# Patient Record
Sex: Female | Born: 1950 | Race: White | Hispanic: No | Smoking: Never smoker
Health system: Southern US, Community
[De-identification: ages and names within clinical notes are randomized; demographics above are authoritative.]

## PROBLEM LIST (undated history)

## (undated) DIAGNOSIS — K8070 Calculus of gallbladder and bile duct without cholecystitis without obstruction: Secondary | ICD-10-CM

## (undated) DIAGNOSIS — K449 Diaphragmatic hernia without obstruction or gangrene: Secondary | ICD-10-CM

## (undated) DIAGNOSIS — K317 Polyp of stomach and duodenum: Secondary | ICD-10-CM

## (undated) DIAGNOSIS — M1990 Unspecified osteoarthritis, unspecified site: Secondary | ICD-10-CM

## (undated) DIAGNOSIS — K219 Gastro-esophageal reflux disease without esophagitis: Secondary | ICD-10-CM

## (undated) DIAGNOSIS — E785 Hyperlipidemia, unspecified: Secondary | ICD-10-CM

## (undated) DIAGNOSIS — E119 Type 2 diabetes mellitus without complications: Secondary | ICD-10-CM

## (undated) DIAGNOSIS — R519 Headache, unspecified: Secondary | ICD-10-CM

## (undated) DIAGNOSIS — J189 Pneumonia, unspecified organism: Secondary | ICD-10-CM

## (undated) HISTORY — DX: Hyperlipidemia, unspecified: E78.5

## (undated) HISTORY — PX: CYST REMOVAL TRUNK: SHX6283

## (undated) HISTORY — PX: APPENDECTOMY: SHX54

## (undated) HISTORY — DX: Type 2 diabetes mellitus without complications: E11.9

## (undated) HISTORY — PX: ABDOMINAL HYSTERECTOMY: SHX81

---
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
## 2001-02-08 ENCOUNTER — Encounter: Payer: Self-pay | Admitting: Gastroenterology

## 2001-02-08 ENCOUNTER — Ambulatory Visit (HOSPITAL_COMMUNITY): Admission: RE | Admit: 2001-02-08 | Discharge: 2001-02-08 | Payer: Self-pay | Admitting: Gastroenterology

## 2004-07-04 ENCOUNTER — Other Ambulatory Visit: Admission: RE | Admit: 2004-07-04 | Discharge: 2004-07-04 | Payer: Self-pay | Admitting: *Deleted

## 2007-12-03 DIAGNOSIS — E042 Nontoxic multinodular goiter: Secondary | ICD-10-CM | POA: Insufficient documentation

## 2011-04-17 DIAGNOSIS — E1169 Type 2 diabetes mellitus with other specified complication: Secondary | ICD-10-CM | POA: Insufficient documentation

## 2011-04-17 DIAGNOSIS — E785 Hyperlipidemia, unspecified: Secondary | ICD-10-CM | POA: Insufficient documentation

## 2011-04-17 DIAGNOSIS — E041 Nontoxic single thyroid nodule: Secondary | ICD-10-CM | POA: Insufficient documentation

## 2013-09-19 ENCOUNTER — Encounter (HOSPITAL_COMMUNITY): Payer: Self-pay | Admitting: Emergency Medicine

## 2013-09-19 ENCOUNTER — Emergency Department (HOSPITAL_COMMUNITY)
Admission: EM | Admit: 2013-09-19 | Discharge: 2013-09-19 | Disposition: A | Discharge status: Home / Self Care | Payer: Worker's Compensation | Attending: Emergency Medicine | Admitting: Emergency Medicine

## 2013-09-19 DIAGNOSIS — Z7982 Long term (current) use of aspirin: Secondary | ICD-10-CM | POA: Diagnosis not present

## 2013-09-19 DIAGNOSIS — Y9289 Other specified places as the place of occurrence of the external cause: Secondary | ICD-10-CM | POA: Diagnosis not present

## 2013-09-19 DIAGNOSIS — Z79899 Other long term (current) drug therapy: Secondary | ICD-10-CM | POA: Insufficient documentation

## 2013-09-19 DIAGNOSIS — S61409A Unspecified open wound of unspecified hand, initial encounter: Secondary | ICD-10-CM | POA: Diagnosis present

## 2013-09-19 DIAGNOSIS — W540XXA Bitten by dog, initial encounter: Secondary | ICD-10-CM | POA: Insufficient documentation

## 2013-09-19 DIAGNOSIS — Y990 Civilian activity done for income or pay: Secondary | ICD-10-CM | POA: Insufficient documentation

## 2013-09-19 DIAGNOSIS — Y9389 Activity, other specified: Secondary | ICD-10-CM | POA: Insufficient documentation

## 2013-09-19 DIAGNOSIS — S61451A Open bite of right hand, initial encounter: Secondary | ICD-10-CM

## 2013-09-19 MED ORDER — AMOXICILLIN-POT CLAVULANATE 875-125 MG PO TABS
1.0000 | ORAL_TABLET | Freq: Once | ORAL | Status: AC
  Administered 2013-09-19: 1 via ORAL
  Filled 2013-09-19: qty 1

## 2013-09-19 MED ORDER — AMOXICILLIN-POT CLAVULANATE 875-125 MG PO TABS: 1.0000 | ORAL_TABLET | Freq: Two times a day (BID) | ORAL | Status: DC

## 2013-09-19 NOTE — ED Notes (Signed)
PA at bedside to completed sutures

## 2013-09-19 NOTE — ED Provider Notes (Signed)
CSN: [id number]     Arrival date & time 09/19/13  2029 History   First MD Initiated Contact with Patient 09/19/13 2050     Chief Complaint  Patient presents with  . Animal Bite   (Consider location/radiation/quality/duration/timing/severity/associated sxs/prior Treatment) HPI History provided by pt.   Pt was bitten by a customer's dog while at work at [employer] just pta.  Bite located on right palm.  Has not washed. Painful and aggravated by palpation and ROM.  Has not taken anything for pain. No associated paresthesias.  Most recent tetanus 6 years ago.  History reviewed. No pertinent past medical history. Past Surgical History  Procedure Laterality Date  . Abdominal hysterectomy    . Cyst removal trunk     History reviewed. No pertinent family history. History  Substance Use Topics  . Smoking status: Never Smoker   . Smokeless tobacco: Not on file  . Alcohol Use: Not on file   OB History   Grav Para Term Preterm Abortions TAB SAB Ect Mult Living                 Review of Systems  All other systems reviewed and are negative.    Allergies  Review of patient's allergies indicates no known allergies.  Home Medications   Current Outpatient Rx  Name  Route  Sig  Dispense  Refill  . aspirin 81 MG tablet   Oral   Take 81 mg by mouth daily.         . calcium-vitamin D (OSCAL WITH D) 500-200 MG-UNIT per tablet   Oral   Take 1 tablet by mouth.         . omega-3 acid ethyl esters (LOVAZA) 1 G capsule   Oral   Take 1 g by mouth daily.         Marland Kitchen omeprazole (PRILOSEC) 20 MG capsule   Oral   Take 20 mg by mouth daily.         Marland Kitchen amoxicillin-clavulanate (AUGMENTIN) 875-125 MG per tablet   Oral   Take 1 tablet by mouth 2 (two) times daily.   13 tablet   0    BP 142/82  Pulse 98  Temp(Src) 98.1 F (36.7 C) (Oral)  Resp 18  SpO2 98% Physical Exam  Nursing note and vitals reviewed. Constitutional: She is oriented to person, place, and time. She appears  well-developed and well-nourished. No distress.  HENT:  Head: Normocephalic and atraumatic.  Eyes:  Normal appearance  Neck: Normal range of motion.  Pulmonary/Chest: Effort normal.  Musculoskeletal: Normal range of motion.  1.5cm subq lac on right thenar eminence.  Oozing.  Clean.  Underlying tissue edematous and ttp.  Painful passive ROM of thumb but 5/5 flexor tendon strength.  Distal sensation intact and brisk cap refill.   Neurological: She is alert and oriented to person, place, and time.  Psychiatric: She has a normal mood and affect. Her behavior is normal.    ED Course  Procedures (including critical care time) LACERATION REPAIR Performed by: Remer Macho Authorized by: Gertha Calkin E Consent: Verbal consent obtained. Risks and benefits: risks, benefits and alternatives were discussed Consent given by: patient Patient identity confirmed: provided demographic data Prepped and Draped in normal sterile fashion Wound explored  Laceration Location: right thenar eminence  Laceration Length: 1.5cmcm  No Foreign Bodies seen or palpated  Anesthesia: local infiltration  Local anesthetic: lidocaine 2% w/out epinephrine  Anesthetic total: 3 ml  Irrigation method: syringe Amount  of cleaning: standard  Skin closure: prolene 4.0  Number of sutures: 4  Technique: simple interrupted  Patient tolerance: Patient tolerated the procedure well with no immediate complications.   Labs Review Labs Reviewed - No data to display Imaging Review No results found.  EKG Interpretation   None       MDM   1. Animal bite of right hand    63yo healthy F presents w/ animal bite to right hand. Wound cleaned and sutured.  Tetanus up to date.  She received first dose of augmentin.  Registration provided her with information regarding contacting animal control. Return precautions discussed.     Remer Macho, PA-C 09/19/13 2134

## 2013-09-19 NOTE — Discharge Instructions (Signed)
Take antibiotic as prescribed.  Keep wound clean and dry.  Follow up with your primary care doctor or [facility] [phone]; [address]) or return to the ER in 7-10 days for wound recheck and suture removal.  You should be seen sooner if you develop fever, worsening pain or redness/drainage of pus at site of wound.     Animal Bite An animal bite can result in a scratch on the skin, deep open cut, puncture of the skin, crush injury, or tearing away of the skin or a body part. Dogs are responsible for most animal bites. Children are bitten more often than adults. An animal bite can range from very mild to more serious. A small bite from your house pet is no cause for alarm. However, some animal bites can become infected or injure a bone or other tissue. You must seek medical care if:  The skin is broken and bleeding does not slow down or stop after 15 minutes.  The puncture is deep and difficult to clean (such as a cat bite).  Pain, warmth, redness, or pus develops around the wound.  The bite is from a stray animal or rodent. There may be a risk of rabies infection.  The bite is from a snake, raccoon, skunk, fox, coyote, or bat. There may be a risk of rabies infection.  The person bitten has a chronic illness such as diabetes, liver disease, or cancer, or the person takes medicine that lowers the immune system.  There is concern about the location and severity of the bite. It is important to clean and protect an animal bite wound right away to prevent infection. Follow these steps:  Clean the wound with plenty of water and soap.  Apply an antibiotic cream.  Apply gentle pressure over the wound with a clean towel or gauze to slow or stop bleeding.  Elevate the affected area above the heart to help stop any bleeding.  Seek medical care. Getting medical care within 8 hours of the animal bite leads to the best possible outcome. DIAGNOSIS  Your caregiver will most  likely:  Take a detailed history of the animal and the bite injury.  Perform a wound exam.  Take your medical history. Blood tests or X-rays may be performed. Sometimes, infected bite wounds are cultured and sent to a lab to identify the infectious bacteria.  TREATMENT  Medical treatment will depend on the location and type of animal bite as well as the patient's medical history. Treatment may include:  Wound care, such as cleaning and flushing the wound with saline solution, bandaging, and elevating the affected area.  Antibiotics.  Tetanus immunization.  Rabies immunization.  Leaving the wound open to heal. This is often done with animal bites, due to the high risk of infection. However, in certain cases, wound closure with stitches, wound adhesive, skin adhesive strips, or staples may be used. Infected bites that are left untreated may require intravenous (IV) antibiotics and surgical treatment in the hospital. Reed  Follow your caregiver's instructions for wound care.  Take all medicines as directed.  If your caregiver prescribes antibiotics, take them as directed. Finish them even if you start to feel better.  Follow up with your caregiver for further exams or immunizations as directed. You may need a tetanus shot if:  You cannot remember when you had your last tetanus shot.  You have never had a tetanus shot.  The injury broke your skin. If you  get a tetanus shot, your arm may swell, get red, and feel warm to the touch. This is common and not a problem. If you need a tetanus shot and you choose not to have one, there is a rare chance of getting tetanus. Sickness from tetanus can be serious. SEEK MEDICAL CARE IF:  You notice warmth, redness, soreness, swelling, pus discharge, or a bad smell coming from the wound.  You have a red line on the skin coming from the wound.  You have a fever, chills, or a general ill feeling.  You have nausea or  vomiting.  You have continued or worsening pain.  You have trouble moving the injured part.  You have other questions or concerns. MAKE SURE YOU:  Understand these instructions.  Will watch your condition.  Will get help right away if you are not doing well or get worse. Document Released: 04/25/2011 Document Revised: 10/30/2011 Document Reviewed: 04/25/2011 [facility] Patient Information 2014 [place].

## 2013-09-19 NOTE — ED Notes (Addendum)
Pt reports being bit by a dog about 30 minutes ago at work.  Pt with 2 puncture wounds to right hand.  Bleeding controlled.  Pt denies being on any blood thinners.  Shots up to date on animal per pt.

## 2013-09-21 NOTE — ED Provider Notes (Signed)
Medical screening examination/treatment/procedure(s) were performed by non-physician practitioner and as supervising physician I was immediately available for consultation/collaboration.  EKG Interpretation   None        Jasper Riling. Alvino Chapel, MD 09/21/13 [phone]

## 2013-09-26 ENCOUNTER — Encounter (HOSPITAL_COMMUNITY): Payer: Self-pay | Admitting: Emergency Medicine

## 2013-09-26 ENCOUNTER — Emergency Department (HOSPITAL_COMMUNITY)
Admission: EM | Admit: 2013-09-26 | Discharge: 2013-09-26 | Disposition: A | Discharge status: Home / Self Care | Payer: Worker's Compensation | Attending: Emergency Medicine | Admitting: Emergency Medicine

## 2013-09-26 DIAGNOSIS — Z792 Long term (current) use of antibiotics: Secondary | ICD-10-CM | POA: Diagnosis not present

## 2013-09-26 DIAGNOSIS — Z7982 Long term (current) use of aspirin: Secondary | ICD-10-CM | POA: Insufficient documentation

## 2013-09-26 DIAGNOSIS — Z79899 Other long term (current) drug therapy: Secondary | ICD-10-CM | POA: Insufficient documentation

## 2013-09-26 DIAGNOSIS — Z4802 Encounter for removal of sutures: Secondary | ICD-10-CM | POA: Diagnosis present

## 2013-09-26 NOTE — ED Provider Notes (Signed)
Medical screening examination/treatment/procedure(s) were performed by non-physician practitioner and as supervising physician I was immediately available for consultation/collaboration.  EKG Interpretation   None         Delice Bison Koury Roddy, DO 09/26/13 1453

## 2013-09-26 NOTE — ED Notes (Signed)
Here for suture removal right hand from dog bite. Wound appears red around the edges. Pt states is "sore".

## 2013-09-26 NOTE — ED Provider Notes (Signed)
CSN: [id number]     Arrival date & time 09/26/13  0925 History  This chart was scribed for Michele Mcalpine, non-physician practitioner, working with Delice Bison Ward, DO by Jenne Campus, ED Scribe. This patient was seen in room TR06C/TR06C and the patient's care was started at 9:39 AM.    CC: Suture removal  The history is provided by the patient. No language interpreter was used.    HPI Comments: Kendra Miles is a 63 y.o. female who presents to the Emergency Department for suture removal. Pt reports that she was bitten by a small terrier on 09/19/13. She was seen approximately 2 hours after the incident and had the wound sutured. She denies any fevers, discharge from the area or other complications since then.    History reviewed. No pertinent past medical history. Past Surgical History  Procedure Laterality Date  . Abdominal hysterectomy    . Cyst removal trunk     No family history on file. History  Substance Use Topics  . Smoking status: Never Smoker   . Smokeless tobacco: Not on file  . Alcohol Use: No   No OB history provided.  Review of Systems  A complete 10 system review of systems was obtained and all systems are negative except as noted in the HPI and PMH.   Allergies  Review of patient's allergies indicates no known allergies.  Home Medications   Current Outpatient Rx  Name  Route  Sig  Dispense  Refill  . amoxicillin-clavulanate (AUGMENTIN) 875-125 MG per tablet   Oral   Take 1 tablet by mouth 2 (two) times daily.   13 tablet   0   . aspirin 81 MG tablet   Oral   Take 81 mg by mouth daily.         . calcium-vitamin D (OSCAL WITH D) 500-200 MG-UNIT per tablet   Oral   Take 1 tablet by mouth.         . omega-3 acid ethyl esters (LOVAZA) 1 G capsule   Oral   Take 1 g by mouth daily.         Marland Kitchen omeprazole (PRILOSEC) 20 MG capsule   Oral   Take 20 mg by mouth daily.          Triage Vitals: BP 129/73  Pulse 92  Temp(Src) 97.3 F (36.3 C)  (Oral)  SpO2 98%  Physical Exam  Nursing note and vitals reviewed. Constitutional: She is oriented to person, place, and time. She appears well-developed and well-nourished. No distress.  HENT:  Head: Normocephalic and atraumatic.  Eyes: EOM are normal.  Neck: Neck supple. No tracheal deviation present.  Cardiovascular: Normal rate.   Pulmonary/Chest: Effort normal. No respiratory distress.  Musculoskeletal: Normal range of motion.  1 cm well-healed laceration with 4 sutures in place over the right thenar eminence. No discharge or erythema. FROM. Distal pulses intact. Brisk capillary refill  Neurological: She is alert and oriented to person, place, and time.  Skin: Skin is warm and dry.  Psychiatric: She has a normal mood and affect. Her behavior is normal.    ED Course  Procedures (including critical care time)  DIAGNOSTIC STUDIES: Oxygen Saturation is 98% on RA, normal by my interpretation.    COORDINATION OF CARE: SUTURE REMOVAL Performed by: Myrene Buddy, PA-C Consent: Verbal consent obtained. Patient identity confirmed: provided demographic data Time out: Immediately prior to procedure a "time out" was called to verify the correct patient, procedure, equipment, support staff and site/side marked  as required. Location: right hand Wound Appearance: clean Sutures/Staples Removed: 4 Patient tolerance: Patient tolerated the procedure well with no immediate complications.    Labs Review Labs Reviewed - No data to display Imaging Review No results found.  EKG Interpretation   None       MDM   1. Visit for suture removal    Neurovascularly intact. No signs of infection. Return precautions given. Patient states understanding of treatment care plan and is agreeable.   I personally performed the services described in this documentation, which was scribed in my presence. The recorded information has been reviewed and is accurate.   Illene Labrador, PA-C 09/26/13  [phone]

## 2013-09-26 NOTE — Discharge Instructions (Signed)
Suture Removal, Care After Refer to this sheet in the next few weeks. These instructions provide you with information on caring for yourself after your procedure. Your health care provider may also give you more specific instructions. Your treatment has been planned according to current medical practices, but problems sometimes occur. Call your health care provider if you have any problems or questions after your procedure. WHAT TO EXPECT AFTER THE PROCEDURE After your stitches (sutures) are removed, it is typical to have the following:  Some discomfort and swelling in the wound area.  Slight redness in the area. HOME CARE INSTRUCTIONS   If you have skin adhesive strips over the wound area, do not take the strips off. They will fall off on their own in a few days. If the strips remain in place after 14 days, you may remove them.  Change any bandages (dressings) at least once a day or as directed by your health care provider. If the bandage sticks, soak it off with warm, soapy water.  Apply cream or ointment only as directed by your health care provider. If using cream or ointment, wash the area with soap and water 2 times a day to remove all the cream or ointment. Rinse off the soap and pat the area dry with a clean towel.  Keep the wound area dry and clean. If the bandage becomes wet or dirty, or if it develops a bad smell, change it as soon as possible.  Continue to protect the wound from injury.  Use sunscreen when out in the sun. New scars become sunburned easily. SEEK MEDICAL CARE IF:  You have increasing redness, swelling, or pain in the wound.  You see pus coming from the wound.  You have a fever.  You notice a bad smell coming from the wound or dressing.  Your wound breaks open (edges not staying together). Document Released: 05/02/2001 Document Revised: 05/28/2013 Document Reviewed: 03/19/2013 [employer] Patient Information 2014 [employer].  

## 2013-10-18 ENCOUNTER — Encounter (HOSPITAL_COMMUNITY): Payer: Self-pay | Admitting: Emergency Medicine

## 2013-10-18 ENCOUNTER — Emergency Department (HOSPITAL_COMMUNITY)
Admission: EM | Admit: 2013-10-18 | Discharge: 2013-10-18 | Disposition: A | Discharge status: Home / Self Care | Payer: Worker's Compensation | Attending: Emergency Medicine | Admitting: Emergency Medicine

## 2013-10-18 DIAGNOSIS — Y33XXXS Other specified events, undetermined intent, sequela: Secondary | ICD-10-CM | POA: Insufficient documentation

## 2013-10-18 DIAGNOSIS — L089 Local infection of the skin and subcutaneous tissue, unspecified: Secondary | ICD-10-CM

## 2013-10-18 DIAGNOSIS — Z5189 Encounter for other specified aftercare: Secondary | ICD-10-CM

## 2013-10-18 DIAGNOSIS — Z79899 Other long term (current) drug therapy: Secondary | ICD-10-CM | POA: Insufficient documentation

## 2013-10-18 DIAGNOSIS — Z7982 Long term (current) use of aspirin: Secondary | ICD-10-CM | POA: Insufficient documentation

## 2013-10-18 NOTE — ED Notes (Signed)
Reports being seen on 1/31 for dog bite to right hand, reports taking antibiotics and everything was healing fine, now having redness to wound this week.

## 2013-10-18 NOTE — ED Notes (Signed)
Dressing applied to hand with bacitracin

## 2013-10-18 NOTE — Discharge Instructions (Signed)
Read the information below.  You may return to the Emergency Department at any time for worsening condition or any new symptoms that concern you.  If you develop redness, swelling, pus draining from the wound, or fevers greater than 100.4, return to the ER immediately for a recheck.

## 2013-10-18 NOTE — ED Provider Notes (Signed)
CSN: [id number]     Arrival date & time 10/18/13  0811 History  This chart was scribed for non-physician practitioner, Clayton Bibles, PA-C working with Hoy Morn, MD by Frederich Balding, ED scribe. This patient was seen in room TR09C/TR09C and the patient's care was started at 9:44 AM.   Chief Complaint  Patient presents with  . Wound Check   The history is provided by the patient. No language interpreter was used.   HPI Comments: Maribell Demeo is a 63 y.o. female who presents to the Emergency Department for a wound check. Pt was seen 09/19/13 for a dog bite to her right hand. The dog was UTD on its vaccinations. She was given Augmentin and took it as prescribed. Pt came in 09/26/13 for suture removal. She states a little white bump popped up this week and is painful. Pt has put neosporin on it with little relief. Denies fever, weakness or numbness in hand.   History reviewed. No pertinent past medical history. Past Surgical History  Procedure Laterality Date  . Abdominal hysterectomy    . Cyst removal trunk     History reviewed. No pertinent family history. History  Substance Use Topics  . Smoking status: Never Smoker   . Smokeless tobacco: Not on file  . Alcohol Use: No   OB History   Grav Para Term Preterm Abortions TAB SAB Ect Mult Living                 Review of Systems  Constitutional: Negative for fever.  Skin: Positive for wound (healing).  Neurological: Negative for weakness and numbness.  All other systems reviewed and are negative.   Allergies  Review of patient's allergies indicates no known allergies.  Home Medications   Current Outpatient Rx  Name  Route  Sig  Dispense  Refill  . amoxicillin-clavulanate (AUGMENTIN) 875-125 MG per tablet   Oral   Take 1 tablet by mouth 2 (two) times daily.   13 tablet   0   . aspirin 81 MG tablet   Oral   Take 81 mg by mouth daily.         . calcium-vitamin D (OSCAL WITH D) 500-200 MG-UNIT per tablet   Oral   Take 1  tablet by mouth.         . omega-3 acid ethyl esters (LOVAZA) 1 G capsule   Oral   Take 1 g by mouth daily.         Marland Kitchen omeprazole (PRILOSEC) 20 MG capsule   Oral   Take 20 mg by mouth daily.          BP 148/87  Pulse 85  Temp(Src) 97.9 F (36.6 C) (Oral)  Resp 18  Wt 208 lb 6 oz (94.518 kg)  SpO2 96%  Physical Exam  Nursing note and vitals reviewed. Constitutional: She appears well-developed and well-nourished. No distress.  HENT:  Head: Normocephalic and atraumatic.  Neck: Neck supple.  Pulmonary/Chest: Effort normal.  Musculoskeletal:  Right hand, thenar eminence with well healed laceration, mild surrounding erythema and small pustule vs papule at proximal edge.    Neurological: She is alert.  Skin: She is not diaphoretic.    ED Course  Procedures (including critical care time)  DIAGNOSTIC STUDIES: Oxygen Saturation is 96% on RA, normal by my interpretation.    COORDINATION OF CARE: 9:46 AM-Discussed treatment plan which includes speaking with Dr. Venora Maples with pt at bedside and pt agreed to plan.  10:01 AM-Dr. Venora Maples saw  and examined pt. Did bedside US and unroofed small pustule with tiny amount of purulent discharge (please see his note for full details). Recommends referral to hand surgeon and topical antibiotic ointment. Discussed return precautions with pt.   Labs Review Labs Reviewed - No data to display Imaging Review No results found.   EKG Interpretation None      MDM   Final diagnoses:  Visit for wound check  Pustule    Pt with dog bite 09/19/13 that was sutured, sutures removed 09/26/13 with good healing noted.  Pt was doing well until 1 week ago, developed small pustule.  It was unclear from my exam whether this was a pustule or a raised area from an imbedded suture.  I asked Dr Venora Maples to see the patient - please see note above and his note for further details.  Pt d/c home with instructions for wound care and hand surgery follow up.  Discussed  findings, treatment, and follow up  with patient.  Pt given return precautions.  Pt verbalizes understanding and agrees with plan.      I personally performed the services described in this documentation, which was scribed in my presence. The recorded information has been reviewed and is accurate.   [place], PA-C 10/18/13 1107

## 2013-10-19 NOTE — ED Provider Notes (Addendum)
Medical screening examination/treatment/procedure(s) were conducted as a shared visit with non-physician practitioner(s) and myself.  I personally evaluated the patient during the encounter.   EKG Interpretation None         INCISION AND DRAINAGE Performed by: Hoy Morn Consent: Verbal consent obtained. Risks and benefits: risks, benefits and alternatives were discussed Time out performed prior to procedure Type: abscess Body area: right hand Anesthesia: none Incision was made a 21 gauge needle to "un roof" the pustule.. Complexity: simple Drainage: none Drainage amount: none Packing material: none Patient tolerance: Patient tolerated the procedure well with no immediate complications.  Suspect neuroma from healed wound. No signs of infection at this time. Hand surgery follow up   Hoy Morn, MD 10/19/13 [place], MD 10/28/13 [phone]

## 2017-03-06 ENCOUNTER — Other Ambulatory Visit: Payer: Self-pay | Admitting: Physician Assistant

## 2017-03-06 DIAGNOSIS — Z1231 Encounter for screening mammogram for malignant neoplasm of breast: Secondary | ICD-10-CM

## 2017-03-26 ENCOUNTER — Ambulatory Visit
Admission: RE | Admit: 2017-03-26 | Discharge: 2017-03-26 | Disposition: A | Discharge status: Home / Self Care | Payer: Managed Care, Other (non HMO) | Source: Ambulatory Visit | Attending: Physician Assistant | Admitting: Physician Assistant

## 2017-03-26 DIAGNOSIS — Z1231 Encounter for screening mammogram for malignant neoplasm of breast: Secondary | ICD-10-CM

## 2018-02-19 ENCOUNTER — Other Ambulatory Visit: Payer: Self-pay | Admitting: Nurse Practitioner

## 2018-02-19 DIAGNOSIS — Z1231 Encounter for screening mammogram for malignant neoplasm of breast: Secondary | ICD-10-CM

## 2018-04-03 ENCOUNTER — Ambulatory Visit
Admission: RE | Admit: 2018-04-03 | Discharge: 2018-04-03 | Disposition: A | Discharge status: Home / Self Care | Payer: Medicare Other | Source: Ambulatory Visit | Attending: Nurse Practitioner | Admitting: Nurse Practitioner

## 2018-04-03 DIAGNOSIS — Z1231 Encounter for screening mammogram for malignant neoplasm of breast: Secondary | ICD-10-CM

## 2019-02-19 DIAGNOSIS — K8070 Calculus of gallbladder and bile duct without cholecystitis without obstruction: Secondary | ICD-10-CM

## 2019-02-19 HISTORY — DX: Calculus of gallbladder and bile duct without cholecystitis without obstruction: K80.70

## 2019-03-06 ENCOUNTER — Emergency Department (HOSPITAL_COMMUNITY): Payer: Medicare Other

## 2019-03-06 ENCOUNTER — Inpatient Hospital Stay (HOSPITAL_COMMUNITY): Payer: Medicare Other

## 2019-03-06 ENCOUNTER — Inpatient Hospital Stay (HOSPITAL_COMMUNITY)
Admission: EM | Admit: 2019-03-06 | Discharge: 2019-03-10 | DRG: 419 | Disposition: A | Discharge status: Home / Self Care | Payer: Medicare Other | Source: Ambulatory Visit | Attending: Internal Medicine | Admitting: Internal Medicine

## 2019-03-06 ENCOUNTER — Encounter (HOSPITAL_COMMUNITY): Payer: Self-pay

## 2019-03-06 ENCOUNTER — Other Ambulatory Visit: Payer: Self-pay

## 2019-03-06 DIAGNOSIS — Z7982 Long term (current) use of aspirin: Secondary | ICD-10-CM

## 2019-03-06 DIAGNOSIS — K219 Gastro-esophageal reflux disease without esophagitis: Secondary | ICD-10-CM | POA: Diagnosis present

## 2019-03-06 DIAGNOSIS — K8050 Calculus of bile duct without cholangitis or cholecystitis without obstruction: Secondary | ICD-10-CM

## 2019-03-06 DIAGNOSIS — E785 Hyperlipidemia, unspecified: Secondary | ICD-10-CM | POA: Diagnosis present

## 2019-03-06 DIAGNOSIS — R109 Unspecified abdominal pain: Secondary | ICD-10-CM

## 2019-03-06 DIAGNOSIS — E119 Type 2 diabetes mellitus without complications: Secondary | ICD-10-CM

## 2019-03-06 DIAGNOSIS — I10 Essential (primary) hypertension: Secondary | ICD-10-CM | POA: Diagnosis present

## 2019-03-06 DIAGNOSIS — E1165 Type 2 diabetes mellitus with hyperglycemia: Secondary | ICD-10-CM

## 2019-03-06 DIAGNOSIS — R1013 Epigastric pain: Secondary | ICD-10-CM | POA: Diagnosis not present

## 2019-03-06 DIAGNOSIS — Z1159 Encounter for screening for other viral diseases: Secondary | ICD-10-CM

## 2019-03-06 DIAGNOSIS — K449 Diaphragmatic hernia without obstruction or gangrene: Secondary | ICD-10-CM | POA: Diagnosis present

## 2019-03-06 DIAGNOSIS — Z825 Family history of asthma and other chronic lower respiratory diseases: Secondary | ICD-10-CM

## 2019-03-06 DIAGNOSIS — Z6832 Body mass index (BMI) 32.0-32.9, adult: Secondary | ICD-10-CM

## 2019-03-06 DIAGNOSIS — D132 Benign neoplasm of duodenum: Secondary | ICD-10-CM | POA: Diagnosis not present

## 2019-03-06 DIAGNOSIS — K8051 Calculus of bile duct without cholangitis or cholecystitis with obstruction: Secondary | ICD-10-CM

## 2019-03-06 DIAGNOSIS — K317 Polyp of stomach and duodenum: Secondary | ICD-10-CM | POA: Diagnosis present

## 2019-03-06 DIAGNOSIS — K8065 Calculus of gallbladder and bile duct with chronic cholecystitis with obstruction: Secondary | ICD-10-CM | POA: Diagnosis present

## 2019-03-06 DIAGNOSIS — Z7984 Long term (current) use of oral hypoglycemic drugs: Secondary | ICD-10-CM | POA: Diagnosis not present

## 2019-03-06 DIAGNOSIS — R1011 Right upper quadrant pain: Secondary | ICD-10-CM

## 2019-03-06 DIAGNOSIS — R945 Abnormal results of liver function studies: Secondary | ICD-10-CM | POA: Diagnosis not present

## 2019-03-06 DIAGNOSIS — I152 Hypertension secondary to endocrine disorders: Secondary | ICD-10-CM | POA: Diagnosis present

## 2019-03-06 DIAGNOSIS — E669 Obesity, unspecified: Secondary | ICD-10-CM | POA: Diagnosis present

## 2019-03-06 DIAGNOSIS — R7989 Other specified abnormal findings of blood chemistry: Secondary | ICD-10-CM

## 2019-03-06 DIAGNOSIS — K8020 Calculus of gallbladder without cholecystitis without obstruction: Secondary | ICD-10-CM | POA: Diagnosis not present

## 2019-03-06 DIAGNOSIS — R933 Abnormal findings on diagnostic imaging of other parts of digestive tract: Secondary | ICD-10-CM

## 2019-03-06 DIAGNOSIS — Z794 Long term (current) use of insulin: Secondary | ICD-10-CM

## 2019-03-06 HISTORY — DX: Gastro-esophageal reflux disease without esophagitis: K21.9

## 2019-03-06 HISTORY — DX: Polyp of stomach and duodenum: K31.7

## 2019-03-06 HISTORY — DX: Diaphragmatic hernia without obstruction or gangrene: K44.9

## 2019-03-06 HISTORY — DX: Calculus of bile duct without cholangitis or cholecystitis with obstruction: K80.51

## 2019-03-06 LAB — CBC WITH DIFFERENTIAL/PLATELET
Abs Immature Granulocytes: 0.03 10*3/uL (ref 0.00–0.07)
Basophils Absolute: 0 10*3/uL (ref 0.0–0.1)
Basophils Relative: 0 %
Eosinophils Absolute: 0.1 10*3/uL (ref 0.0–0.5)
Eosinophils Relative: 1 %
HCT: 44.9 % (ref 36.0–46.0)
Hemoglobin: 14.9 g/dL (ref 12.0–15.0)
Immature Granulocytes: 0 %
Lymphocytes Relative: 15 %
Lymphs Abs: 1.3 10*3/uL (ref 0.7–4.0)
MCH: 30.3 pg (ref 26.0–34.0)
MCHC: 33.2 g/dL (ref 30.0–36.0)
MCV: 91.4 fL (ref 80.0–100.0)
Monocytes Absolute: 0.6 10*3/uL (ref 0.1–1.0)
Monocytes Relative: 7 %
Neutro Abs: 6.5 10*3/uL (ref 1.7–7.7)
Neutrophils Relative %: 77 %
Platelets: 271 10*3/uL (ref 150–400)
RBC: 4.91 MIL/uL (ref 3.87–5.11)
RDW: 13.5 % (ref 11.5–15.5)
WBC: 8.6 10*3/uL (ref 4.0–10.5)
nRBC: 0 % (ref 0.0–0.2)

## 2019-03-06 LAB — ACETAMINOPHEN LEVEL: Acetaminophen (Tylenol), Serum: <10 ug/mL — ABNORMAL LOW (ref 10–30)

## 2019-03-06 LAB — TROPONIN I (HIGH SENSITIVITY)
Troponin I (High Sensitivity): 11 ng/L (ref <18)
Troponin I (High Sensitivity): 10 ng/L (ref <18)

## 2019-03-06 LAB — COMPREHENSIVE METABOLIC PANEL
ALT: 457 U/L — ABNORMAL HIGH (ref 0–44)
AST: 662 U/L — ABNORMAL HIGH (ref 15–41)
Albumin: 4 g/dL (ref 3.5–5.0)
Alkaline Phosphatase: 215 U/L — ABNORMAL HIGH (ref 38–126)
Anion gap: 10 (ref 5–15)
BUN: 13 mg/dL (ref 8–23)
CO2: 28 mmol/L (ref 22–32)
Calcium: 9.3 mg/dL (ref 8.9–10.3)
Chloride: 104 mmol/L (ref 98–111)
Creatinine, Ser: 0.75 mg/dL (ref 0.44–1.00)
GFR calc Af Amer: >60 mL/min (ref >60)
GFR calc non Af Amer: >60 mL/min (ref >60)
Glucose, Bld: 123 mg/dL — ABNORMAL HIGH (ref 70–99)
Potassium: 3.9 mmol/L (ref 3.5–5.1)
Sodium: 142 mmol/L (ref 135–145)
Total Bilirubin: 1.6 mg/dL — ABNORMAL HIGH (ref 0.3–1.2)
Total Protein: 6.7 g/dL (ref 6.5–8.1)

## 2019-03-06 LAB — PROTIME-INR
INR: 1 (ref 0.8–1.2)
Prothrombin Time: 12.7 seconds (ref 11.4–15.2)

## 2019-03-06 LAB — LIPASE, BLOOD: Lipase: 32 U/L (ref 11–51)

## 2019-03-06 LAB — CBG MONITORING, ED: Glucose-Capillary: 79 mg/dL (ref 70–99)

## 2019-03-06 LAB — SARS CORONAVIRUS 2 BY RT PCR (HOSPITAL ORDER, PERFORMED IN ~~LOC~~ HOSPITAL LAB): SARS Coronavirus 2: NEGATIVE

## 2019-03-06 MED ORDER — ASPIRIN EC 81 MG PO TBEC: 81.0000 mg | DELAYED_RELEASE_TABLET | Freq: Every day | ORAL | Status: DC

## 2019-03-06 MED ORDER — MORPHINE SULFATE (PF) 2 MG/ML IV SOLN: 2.0000 mg | INTRAVENOUS | Status: DC | PRN

## 2019-03-06 MED ORDER — SODIUM CHLORIDE 0.9 % IV SOLN
Freq: Once | INTRAVENOUS | Status: AC
  Administered 2019-03-06: 17:00:00 via INTRAVENOUS

## 2019-03-06 MED ORDER — GADOBUTROL 1 MMOL/ML IV SOLN
10.0000 mL | Freq: Once | INTRAVENOUS | Status: AC | PRN
  Administered 2019-03-06: 10 mL via INTRAVENOUS

## 2019-03-06 MED ORDER — ONDANSETRON HCL 4 MG PO TABS: 4.0000 mg | ORAL_TABLET | Freq: Four times a day (QID) | ORAL | Status: DC | PRN

## 2019-03-06 MED ORDER — POLYETHYLENE GLYCOL 3350 17 G PO PACK: 17.0000 g | PACK | Freq: Every day | ORAL | Status: DC | PRN

## 2019-03-06 MED ORDER — TRAMADOL HCL 50 MG PO TABS
50.0000 mg | ORAL_TABLET | Freq: Four times a day (QID) | ORAL | Status: DC | PRN
  Administered 2019-03-07 – 2019-03-08 (×2): 50 mg via ORAL
  Filled 2019-03-06 (×2): qty 1

## 2019-03-06 MED ORDER — ONDANSETRON HCL 4 MG/2ML IJ SOLN: 4.0000 mg | Freq: Four times a day (QID) | INTRAMUSCULAR | Status: DC | PRN

## 2019-03-06 MED ORDER — ENOXAPARIN SODIUM 40 MG/0.4ML ~~LOC~~ SOLN
40.0000 mg | SUBCUTANEOUS | Status: DC
  Administered 2019-03-06: 40 mg via SUBCUTANEOUS
  Filled 2019-03-06: qty 0.4

## 2019-03-06 MED ORDER — LISINOPRIL 5 MG PO TABS
2.5000 mg | ORAL_TABLET | Freq: Every day | ORAL | Status: DC
  Administered 2019-03-07 – 2019-03-10 (×4): 2.5 mg via ORAL
  Filled 2019-03-06 (×4): qty 1

## 2019-03-06 MED ORDER — ROSUVASTATIN CALCIUM 10 MG PO TABS
10.0000 mg | ORAL_TABLET | Freq: Every day | ORAL | Status: DC
  Administered 2019-03-07 – 2019-03-10 (×4): 10 mg via ORAL
  Filled 2019-03-06 (×4): qty 1

## 2019-03-06 MED ORDER — INSULIN ASPART 100 UNIT/ML ~~LOC~~ SOLN
0.0000 [IU] | Freq: Three times a day (TID) | SUBCUTANEOUS | Status: DC
  Administered 2019-03-07: 1 [IU] via SUBCUTANEOUS
  Administered 2019-03-07: 2 [IU] via SUBCUTANEOUS
  Administered 2019-03-08: 5 [IU] via SUBCUTANEOUS
  Administered 2019-03-08: 3 [IU] via SUBCUTANEOUS
  Administered 2019-03-09: 2 [IU] via SUBCUTANEOUS
  Administered 2019-03-09: 7 [IU] via SUBCUTANEOUS
  Administered 2019-03-09 – 2019-03-10 (×3): 2 [IU] via SUBCUTANEOUS

## 2019-03-06 MED ORDER — PANTOPRAZOLE SODIUM 40 MG PO TBEC
40.0000 mg | DELAYED_RELEASE_TABLET | Freq: Every day | ORAL | Status: DC
  Administered 2019-03-07 – 2019-03-10 (×4): 40 mg via ORAL
  Filled 2019-03-06 (×4): qty 1

## 2019-03-06 NOTE — ED Notes (Signed)
Patient ambulated from triage w/ a steady gate.

## 2019-03-06 NOTE — ED Notes (Signed)
TWO UNSUCCESSFUL LAB COLLECTIONS ATTEMPTS

## 2019-03-06 NOTE — ED Notes (Signed)
Patient transported to MRI 

## 2019-03-06 NOTE — Progress Notes (Signed)
Patient was transferred from ED to 5 E at [facility] after MRI was done. Alert and oriented x4. Abdominal pain is complained. Vital signs was taken. Room is set up. Call light is within patient's reach.

## 2019-03-06 NOTE — ED Triage Notes (Addendum)
Patient reports that she had mid chest pain that radiated to her mid back last night. Patient states she gets "hot flashes " all the time and does not know if it was when she had chest pain or not. Patient denies any chest pain today.  Patient went to her PCP today and was told to come to the ED for an irregular heart rate.  aT THE END OF TRIAGE, PATIENT STATED THAT SHE WAS HAVING "CRAMPING AT THE BASE OF HER RIB CAGE."

## 2019-03-06 NOTE — ED Notes (Signed)
ED TO INPATIENT HANDOFF REPORT  ED Nurse Name and Phone #: Kerry Dory, RN  S Name/Age/Gender Lajuan Lines 68 y.o. female Room/Bed: WA17/WA17  Code Status   Code Status: Not on file  Home/SNF/Other Home Patient oriented to: self, place, time and situation Is this baseline? Yes   Triage Complete: Triage complete  Chief Complaint Irregular heart beat, sent by dr  Triage Note Patient reports that she had mid chest pain that radiated to her mid back last night. Patient states she gets "hot flashes " all the time and does not know if it was when she had chest pain or not. Patient denies any chest pain today.  Patient went to her PCP today and was told to come to the ED for an irregular heart rate.  aT THE END OF TRIAGE, PATIENT STATED THAT SHE WAS HAVING "CRAMPING AT THE BASE OF HER RIB CAGE."   Allergies No Known Allergies  Level of Care/Admitting Diagnosis ED Disposition    ED Disposition Condition Comment   Admit  Hospital Area: [facility] [[id number]]  Level of Care: Med-Surg [16]  Covid Evaluation: Person Under Investigation (PUI)  Diagnosis: Choledocholithiasis with obstruction [[id number]]  Admitting Physician: Alma Friendly [[id number]]  Attending Physician: Alma Friendly [[id number]]  Estimated length of stay: past midnight tomorrow  Certification:: I certify this patient will need inpatient services for at least 2 midnights  PT Class (Do Not Modify): Inpatient [101]  PT Acc Code (Do Not Modify): Private [1]       B Medical/Surgery History Past Medical History:  Diagnosis Date  . Acid reflux    Past Surgical History:  Procedure Laterality Date  . ABDOMINAL HYSTERECTOMY     partial  . APPENDECTOMY    . CYST REMOVAL TRUNK       A IV Location/Drains/Wounds Patient Lines/Drains/Airways Status   Active Line/Drains/Airways    Name:   Placement date:   Placement time:   Site:   Days:   Peripheral IV 03/06/19 Left Forearm   03/06/19     1202    Forearm   less than 1   Wound 09/19/13 Puncture Hand Right   09/19/13    2100    Hand   1994          Intake/Output Last 24 hours No intake or output data in the 24 hours ending 03/06/19 1714  Labs/Imaging Results for orders placed or performed during the hospital encounter of 03/06/19 (from the past 48 hour(s))  Comprehensive metabolic panel     Status: Abnormal   Collection Time: 03/06/19 11:05 AM  Result Value Ref Range   Sodium 142 135 - 145 mmol/L   Potassium 3.9 3.5 - 5.1 mmol/L   Chloride 104 98 - 111 mmol/L   CO2 28 22 - 32 mmol/L   Glucose, Bld 123 (H) 70 - 99 mg/dL   BUN 13 8 - 23 mg/dL   Creatinine, Ser 0.75 0.44 - 1.00 mg/dL   Calcium 9.3 8.9 - 10.3 mg/dL   Total Protein 6.7 6.5 - 8.1 g/dL   Albumin 4.0 3.5 - 5.0 g/dL   AST 662 (H) 15 - 41 U/L   ALT 457 (H) 0 - 44 U/L   Alkaline Phosphatase 215 (H) 38 - 126 U/L   Total Bilirubin 1.6 (H) 0.3 - 1.2 mg/dL   GFR calc non Af Amer >60 >60 mL/min   GFR calc Af Amer >60 >60 mL/min   Anion gap 10 5 - 15  Comment: Performed at [facility], [place] [address] [place], [place] [address]  Lipase, blood     Status: None   Collection Time: 03/06/19 11:05 AM  Result Value Ref Range   Lipase 32 11 - 51 U/L    Comment: Performed at [facility], [place] [address], [place], [place] [address]  Troponin I (High Sensitivity)     Status: None   Collection Time: 03/06/19 11:05 AM  Result Value Ref Range   Troponin I (High Sensitivity) 11 <18 ng/L    Comment: (NOTE) Elevated high sensitivity troponin I (hsTnI) values and significant  changes across serial measurements may suggest ACS but many other  chronic and acute conditions are known to elevate hsTnI results.  Refer to the "Links" section for chest pain algorithms and additional  guidance. Performed at [facility], [place] [address] [place], [place] [address]   CBC with Differential     Status: None   Collection  Time: 03/06/19 11:05 AM  Result Value Ref Range   WBC 8.6 4.0 - 10.5 K/uL   RBC 4.91 3.87 - 5.11 MIL/uL   Hemoglobin 14.9 12.0 - 15.0 g/dL   HCT 44.9 36.0 - 46.0 %   MCV 91.4 80.0 - 100.0 fL   MCH 30.3 26.0 - 34.0 pg   MCHC 33.2 30.0 - 36.0 g/dL   RDW 13.5 11.5 - 15.5 %   Platelets 271 150 - 400 K/uL   nRBC 0.0 0.0 - 0.2 %   Neutrophils Relative % 77 %   Neutro Abs 6.5 1.7 - 7.7 K/uL   Lymphocytes Relative 15 %   Lymphs Abs 1.3 0.7 - 4.0 K/uL   Monocytes Relative 7 %   Monocytes Absolute 0.6 0.1 - 1.0 K/uL   Eosinophils Relative 1 %   Eosinophils Absolute 0.1 0.0 - 0.5 K/uL   Basophils Relative 0 %   Basophils Absolute 0.0 0.0 - 0.1 K/uL   Immature Granulocytes 0 %   Abs Immature Granulocytes 0.03 0.00 - 0.07 K/uL    Comment: Performed at [facility], [place] [address], [place], [place] [address]  Acetaminophen level     Status: Abnormal   Collection Time: 03/06/19 11:05 AM  Result Value Ref Range   Acetaminophen (Tylenol), Serum <10 (L) 10 - 30 ug/mL    Comment: (NOTE) Therapeutic concentrations vary significantly. A range of 10-30 ug/mL  may be an effective concentration for many patients. However, some  are best treated at concentrations outside of this range. Acetaminophen concentrations >150 ug/mL at 4 hours after ingestion  and >50 ug/mL at 12 hours after ingestion are often associated with  toxic reactions. Performed at [facility], [place] [address], [place], [place] [address]   Protime-INR     Status: None   Collection Time: 03/06/19 11:05 AM  Result Value Ref Range   Prothrombin Time 12.7 11.4 - 15.2 seconds   INR 1.0 0.8 - 1.2    Comment: (NOTE) INR goal varies based on device and disease states. Performed at [facility], [place] [address] [place],  [address]   SARS Coronavirus 2 (CEPHEID - Performed in [place] hospital lab), Hosp Order     Status: None   Collection Time: 03/06/19  2:36 PM    Specimen: Nasopharyngeal Swab  Result Value Ref Range   SARS Coronavirus 2 NEGATIVE NEGATIVE    Comment: (NOTE) If result is NEGATIVE SARS-CoV-2 target nucleic acids are NOT DETECTED.  The SARS-CoV-2 RNA is generally detectable in upper and lower  respiratory specimens during the acute phase of infection. The lowest  concentration of SARS-CoV-2 viral copies this assay can detect is 250  copies / mL. A negative result does not preclude SARS-CoV-2 infection  and should not be used as the sole basis for treatment or other  patient management decisions.  A negative result may occur with  improper specimen collection / handling, submission of specimen other  than nasopharyngeal swab, presence of viral mutation(s) within the  areas targeted by this assay, and inadequate number of viral copies  (<250 copies / mL). A negative result must be combined with clinical  observations, patient history, and epidemiological information. If result is POSITIVE SARS-CoV-2 target nucleic acids are DETECTED. The SARS-CoV-2 RNA is generally detectable in upper and lower  respiratory specimens dur ing the acute phase of infection.  Positive  results are indicative of active infection with SARS-CoV-2.  Clinical  correlation with patient history and other diagnostic information is  necessary to determine patient infection status.  Positive results do  not rule out bacterial infection or co-infection with other viruses. If result is PRESUMPTIVE POSTIVE SARS-CoV-2 nucleic acids MAY BE PRESENT.   A presumptive positive result was obtained on the submitted specimen  and confirmed on repeat testing.  While 2019 novel coronavirus  (SARS-CoV-2) nucleic acids may be present in the submitted sample  additional confirmatory testing may be necessary for epidemiological  and / or clinical management purposes  to differentiate between  SARS-CoV-2 and other Sarbecovirus currently known to infect humans.  If clinically  indicated additional testing with an alternate test  methodology [phone]) is advised. The SARS-CoV-2 RNA is generally  detectable in upper and lower respiratory sp ecimens during the acute  phase of infection. The expected result is Negative. Fact Sheet for Patients:  StrictlyIdeas.no Fact Sheet for Healthcare Providers: BankingDealers.co.za This test is not yet approved or cleared by the [place] FDA and has been authorized for detection and/or diagnosis of SARS-CoV-2 by FDA under an Emergency Use Authorization (EUA).  This EUA will remain in effect (meaning this test can be used) for the duration of the COVID-19 declaration under Section 564(b)(1) of the Act, 21 U.S.C. section 360bbb-3(b)(1), unless the authorization is terminated or revoked sooner. Performed at [facility], [place] [address] [place] [address], [place], [place] [address]   Troponin I (High Sensitivity)     Status: None   Collection Time: 03/06/19  2:47 PM  Result Value Ref Range   Troponin I (High Sensitivity) 10.0 <18 ng/L    Comment: (NOTE) Elevated high sensitivity troponin I (hsTnI) values and significant  changes across serial measurements may suggest ACS but many other  chronic and acute conditions are known to elevate hsTnI results.  Refer to the "Links" section for chest pain algorithms and additional  guidance. Performed at [facility], [place] [address] [place], [place] [address]   CBG monitoring, ED     Status: None   Collection Time: 03/06/19  4:39 PM  Result Value Ref Range   Glucose-Capillary 79 70 - 99 mg/dL   Dg Chest Portable 1 View  Result Date: 03/06/2019 CLINICAL DATA:  Chest pain. EXAM: PORTABLE CHEST 1 VIEW COMPARISON:  None. FINDINGS: The heart size and mediastinal contours are within normal limits. Both lungs are clear. No pneumothorax or pleural effusion is noted. The visualized skeletal structures are unremarkable.  IMPRESSION: No active disease. Electronically Signed   By: Jeneen Rinks  Murlean Caller M.D.   On: 03/06/2019 11:43   US Abdomen Limited Ruq  Result Date: 03/06/2019 CLINICAL DATA:  Right upper quadrant pain. EXAM: ULTRASOUND ABDOMEN LIMITED RIGHT UPPER QUADRANT COMPARISON:  None. FINDINGS: Gallbladder: Cholelithiasis is identified. The gallbladder wall thickness measures 3.3 mm. No Murphy's sign or pericholecystic fluid identified. Common bile duct: Diameter: 6.6 mm Liver: Increased echogenicity with no focal mass. Portal vein is patent on color Doppler imaging with normal direction of blood flow towards the liver. IMPRESSION: 1. The common bile duct measures 6.6 mm. 6 mm is the upper limits of normal. Recommend correlation with LFTs. If there is concern for biliary obstruction, recommend an MRCP. 2. Several small stones are seen in the gallbladder. The gallbladder wall thickness of 3.3 mm is borderline. 3 mm is considered the upper limits of normal. No pericholecystic fluid or Murphy's sign. Recommend attention to the gallbladder if the patient receives an MRCP. Otherwise, if the patient does not have an MRCP and there is concern for acute cholecystitis, recommend HIDA scan. 3. Increased echogenicity in the liver is nonspecific but often seen with hepatic steatosis. Electronically Signed   By: Dorise Bullion III M.D   On: 03/06/2019 13:59    Pending Labs Unresulted Labs (From admission, onward)    Start     Ordered   03/07/19 0500  Hemoglobin A1c  Tomorrow morning,   R    Comments: To assess prior glycemic control    03/06/19 1624   03/07/19 0500  Comprehensive metabolic panel  Tomorrow morning,   R     03/06/19 1628   03/06/19 1235  Hepatitis panel, acute  ONCE - STAT,   STAT     03/06/19 1234   Signed and Held  HIV antibody (Routine Testing)  Once,   R     Signed and Held   Signed and Held  CBC  Tomorrow morning,   R     Signed and Held          Vitals/Pain Today's Vitals   03/06/19 1541  03/06/19 1552 03/06/19 1553 03/06/19 1633  BP: (!) 162/76   (!) 164/91  Pulse: (!) 55 83  90  Resp: (!) 24 18 18  (!) 21  Temp:      TempSrc:      SpO2: 93% 98%  96%  Weight:      Height:      PainSc:        Isolation Precautions No active isolations  Medications Medications  aspirin tablet 81 mg (has no administration in time range)  lisinopril (ZESTRIL) tablet 2.5 mg (has no administration in time range)  pantoprazole (PROTONIX) EC tablet 40 mg (has no administration in time range)  rosuvastatin (CRESTOR) tablet 10 mg (has no administration in time range)  morphine 2 MG/ML injection 2 mg (has no administration in time range)  insulin aspart (novoLOG) injection 0-9 Units (0 Units Subcutaneous Not Given 03/06/19 1641)  gadobutrol (GADAVIST) 1 MMOL/ML injection 10 mL (has no administration in time range)  0.9 %  sodium chloride infusion ( Intravenous New Bag/Given 03/06/19 1635)    Mobility walks Low fall risk   Focused Assessments GI Assesment    R Recommendations: See Admitting Provider Note  Report given to:   Additional Notes:

## 2019-03-06 NOTE — ED Notes (Signed)
Attempted to give report x 1. This Probation officer was told that they will receive a call back.

## 2019-03-06 NOTE — ED Provider Notes (Signed)
Emergency Department Provider Note   I have reviewed the triage vital signs and the nursing notes.   HISTORY  Chief Complaint Irregular Heart Beat   HPI Kendra Miles is a 68 y.o. female with past medical history of hiatal hernia presents to the emergency department for evaluation of chest discomfort last night which occurred for approximately 15 minutes.  Patient symptoms have resolved and she went to see her primary care physician today who noticed changes on her EKG, heart rate irregularity, and referred her to the emergency department.  She is not having active pain.  She states that when she stands she does have some discomfort.  She intermittently has discomfort with eating/swallowing which she is attributed to her hiatal hernia.  The symptoms resolved with vomiting.  The pain that she had last night was more sharp and radiating to the back.  She states this was atypical for her hiatal hernia type pain.  She denies shortness of breath, heart palpitations, shortness of breath.  No fevers or chills.  No sick contacts.  Past Medical History:  Diagnosis Date  . Acid reflux     There are no active problems to display for this patient.   Past Surgical History:  Procedure Laterality Date  . ABDOMINAL HYSTERECTOMY     partial  . APPENDECTOMY    . CYST REMOVAL TRUNK      Allergies Patient has no known allergies.  Family History  Problem Relation Age of Onset  . COPD Father     Social History Social History   Tobacco Use  . Smoking status: Never Smoker  . Smokeless tobacco: Never Used  Substance Use Topics  . Alcohol use: No  . Drug use: Never    Review of Systems  Constitutional: No fever/chills Eyes: No visual changes. ENT: No sore throat. Cardiovascular: Positive chest pain. Respiratory: Denies shortness of breath. Gastrointestinal: No abdominal pain.  No nausea, no vomiting.  No diarrhea.  No constipation. Genitourinary: Negative for dysuria.  Musculoskeletal: Negative for back pain. Skin: Negative for rash. Neurological: Negative for headaches, focal weakness or numbness.  10-point ROS otherwise negative.  ____________________________________________   PHYSICAL EXAM:  VITAL SIGNS: ED Triage Vitals  Enc Vitals Group     BP 03/06/19 1041 127/81     Pulse Rate 03/06/19 1041 85     Resp 03/06/19 1041 20     Temp 03/06/19 1041 98.4 F (36.9 C)     Temp Source 03/06/19 1041 Oral     SpO2 03/06/19 1041 96 %     Weight 03/06/19 1043 205 lb (93 kg)     Height 03/06/19 1043 5\' 6"  (1.676 m)   Constitutional: Alert and oriented. Well appearing and in no acute distress. Eyes: Conjunctivae are normal.  Head: Atraumatic. Nose: No congestion/rhinnorhea. Mouth/Throat: Mucous membranes are moist.  Neck: No stridor. Cardiovascular: Normal rate, regular rhythm. Good peripheral circulation. Grossly normal heart sounds.   Respiratory: Normal respiratory effort.  No retractions. Lungs CTAB. Gastrointestinal: Soft and nontender. No distention.  Musculoskeletal: No lower extremity tenderness nor edema. No gross deformities of extremities. Neurologic:  Normal speech and language. No gross focal neurologic deficits are appreciated.  Skin:  Skin is warm, dry and intact. No rash noted.  ____________________________________________   LABS (all labs ordered are listed, but only abnormal results are displayed)  Labs Reviewed  COMPREHENSIVE METABOLIC PANEL - Abnormal; Notable for the following components:      Result Value   Glucose, Bld 123 (*)  AST 662 (*)    ALT 457 (*)    Alkaline Phosphatase 215 (*)    Total Bilirubin 1.6 (*)    All other components within normal limits  ACETAMINOPHEN LEVEL - Abnormal; Notable for the following components:   Acetaminophen (Tylenol), Serum <10 (*)    All other components within normal limits  SARS CORONAVIRUS 2 (HOSPITAL ORDER, [facility])  LIPASE, BLOOD  CBC WITH  DIFFERENTIAL/PLATELET  PROTIME-INR  HEPATITIS PANEL, ACUTE  TROPONIN I (HIGH SENSITIVITY)  TROPONIN I (HIGH SENSITIVITY)   ____________________________________________  EKG   EKG Interpretation  Date/Time:  Thursday March 06 2019 10:37:27 EDT Ventricular Rate:  88 PR Interval:    QRS Duration: 138 QT Interval:  355 QTC Calculation: 430 R Axis:   50 Text Interpretation:  Sinus tachycardia Atrial premature complexes Right bundle branch block Baseline wander in lead(s) V4 No STEMI  Confirmed by Nanda Quinton [phone]) on 03/06/2019 10:56:45 AM       ____________________________________________  RADIOLOGY  Dg Chest Portable 1 View  Result Date: 03/06/2019 CLINICAL DATA:  Chest pain. EXAM: PORTABLE CHEST 1 VIEW COMPARISON:  None. FINDINGS: The heart size and mediastinal contours are within normal limits. Both lungs are clear. No pneumothorax or pleural effusion is noted. The visualized skeletal structures are unremarkable. IMPRESSION: No active disease. Electronically Signed   By: Marijo Conception M.D.   On: 03/06/2019 11:43   US Abdomen Limited Ruq  Result Date: 03/06/2019 CLINICAL DATA:  Right upper quadrant pain. EXAM: ULTRASOUND ABDOMEN LIMITED RIGHT UPPER QUADRANT COMPARISON:  None. FINDINGS: Gallbladder: Cholelithiasis is identified. The gallbladder wall thickness measures 3.3 mm. No Murphy's sign or pericholecystic fluid identified. Common bile duct: Diameter: 6.6 mm Liver: Increased echogenicity with no focal mass. Portal vein is patent on color Doppler imaging with normal direction of blood flow towards the liver. IMPRESSION: 1. The common bile duct measures 6.6 mm. 6 mm is the upper limits of normal. Recommend correlation with LFTs. If there is concern for biliary obstruction, recommend an MRCP. 2. Several small stones are seen in the gallbladder. The gallbladder wall thickness of 3.3 mm is borderline. 3 mm is considered the upper limits of normal. No pericholecystic fluid or  Murphy's sign. Recommend attention to the gallbladder if the patient receives an MRCP. Otherwise, if the patient does not have an MRCP and there is concern for acute cholecystitis, recommend HIDA scan. 3. Increased echogenicity in the liver is nonspecific but often seen with hepatic steatosis. Electronically Signed   By: Dorise Bullion III M.D   On: 03/06/2019 13:59    ____________________________________________   PROCEDURES  Procedure(s) performed:   Procedures  None  ____________________________________________   INITIAL IMPRESSION / ASSESSMENT AND PLAN / ED COURSE  Pertinent labs & imaging results that were available during my care of the patient were reviewed by me and considered in my medical decision making (see chart for details).   Patient presents to the emergency department for evaluation of chest discomfort.  She arrives with several old EKGs provided by her PCP.  She does appear to have some new RSR' morphology in the lateral leads but in comparison with prior she has had this same morphology in the inferior leads.  No acute ischemic changes.  She is in sinus rhythm with occasional PAC which seems to be contributing to the irregularity identified.  No active chest pain but did have some last night.  Plan for screening labs including troponin and chest x-ray.  02:20 PM  LFTs and bilirubin are elevated.  Right upper quadrant ultrasound reviewed which shows dilated CBD and borderline thickened gallbladder wall.  No pericholecystic fluid.  Discussed these findings with Dr. Penelope Coop with [facility].  Agrees with plan for MRCP given patient continuing to have pain, although fairly minimal at this point.  They will consult tomorrow after MRCP to see if patient requires ERCP or other testing.   Discussed patient's case with Hospitalist to request admission. Patient and family (if present) updated with plan. Care transferred to Hospitalist service.  I reviewed all nursing notes, vitals,  pertinent old records, EKGs, labs, imaging (as available).  ____________________________________________  FINAL CLINICAL IMPRESSION(S) / ED DIAGNOSES  Final diagnoses:  RUQ abdominal pain  Elevated LFTs     Note:  This document was prepared using Dragon voice recognition software and may include unintentional dictation errors.  Nanda Quinton, MD Emergency Medicine    Long, [facility], MD 03/06/19 [id number]

## 2019-03-06 NOTE — H&P (Signed)
History and Physical  Kendra Miles CZY:[id number] DOB: 07/24/1951 DOA: 03/06/2019   Patient coming from: Home & is able to ambulate  Chief Complaint: Chest pain   HPI: Kendra Miles is a 68 y.o. female with medical history significant for hypertension, hyperlipidemia, GERD, DM, hiatal hernia presents to the ED complaining of chest discomfort for for the past 1 day.  Patient reported pain more in the epigastric region and some radiation to the back.  Reports the pain woke her up around 2 AM, was a 9/10, sharp pain.  Today patient went to see her primary care, who noticed changes on her EKG and sent her to the ED.  Patient currently rates her pain as a 4/10, denies any nausea/vomiting, diarrhea, fever/chills, cough, shortness of breath, dizziness.  No sick contacts.  ED Course: In the ED, patient vital signs stable, labs showed alkaline phosphatase elevated at 215, AST 662, ALT 457, T bili 1.6.  Troponin levels were less than and repeat was 10.  EKG showed normal sinus rhythm, with no acute ST changes.  Limited right upper quadrant ultrasound showed common bile duct measuring about 6.6 mm, with several small stones in the gallbladder, recommended an MRCP.  EDP consulted GI Dr. Penelope Coop, who recommended an MRCP and will see patient tomorrow.  Hospitalist consulted for admission.  Review of Systems: Review of systems are otherwise negative   Past Medical History:  Diagnosis Date   Acid reflux    Past Surgical History:  Procedure Laterality Date   ABDOMINAL HYSTERECTOMY     partial   APPENDECTOMY     CYST REMOVAL TRUNK      Social History:  reports that she has never smoked. She has never used smokeless tobacco. She reports that she does not drink alcohol or use drugs.   No Known Allergies  Family History  Problem Relation Age of Onset   COPD Father       Prior to Admission medications   Medication Sig Start Date End Date Taking? Authorizing Provider  aspirin 81 MG tablet Take  81 mg by mouth daily.   Yes [provider]  cholecalciferol (VITAMIN D3) 25 MCG (1000 UT) tablet Take 1,000 Units by mouth daily.   Yes [provider]  glipiZIDE (GLUCOTROL) 10 MG tablet Take 10 mg by mouth daily. 12/20/18  Yes [provider]  lisinopril (ZESTRIL) 2.5 MG tablet Take 2.5 mg by mouth daily. 02/15/19  Yes [provider]  Multiple Vitamin (MULTIVITAMIN WITH MINERALS) TABS tablet Take 1 tablet by mouth daily.   Yes [provider]  Omega-3 Fatty Acids (FISH OIL) 1000 MG CAPS Take 1,000 mg by mouth daily.   Yes [provider]  omeprazole (PRILOSEC) 20 MG capsule Take 20 mg by mouth daily.   Yes [provider]  rosuvastatin (CRESTOR) 10 MG tablet  03/03/19  Yes [provider]  amoxicillin-clavulanate (AUGMENTIN) 875-125 MG per tablet Take 1 tablet by mouth 2 (two) times daily. Patient not taking: Reported on 03/06/2019 09/19/13   Schinlever, Barnetta Chapel, PA-C    Physical Exam: BP (!) 164/91    Pulse 90    Temp 98.4 F (36.9 C) (Oral)    Resp (!) 21    Ht 5\' 6"  (1.676 m)    Wt 93 kg    SpO2 96%    BMI 33.09 kg/m   General: NAD Eyes: Normal ENT: Normal Neck: Supple Cardiovascular: S1-S2 present Respiratory: CTA B Abdomen: Soft, tender around the epigastric region, nondistended, bowel sounds  present Skin: Normal Musculoskeletal: No pedal edema bilaterally Psychiatric: Normal mood Neurologic: No focal neurologic deficit noted          Labs on Admission:  Basic Metabolic Panel: Recent Labs  Lab 03/06/19 1105  NA 142  K 3.9  CL 104  CO2 28  GLUCOSE 123*  BUN 13  CREATININE 0.75  CALCIUM 9.3   Liver Function Tests: Recent Labs  Lab 03/06/19 1105  AST 662*  ALT 457*  ALKPHOS 215*  BILITOT 1.6*  PROT 6.7  ALBUMIN 4.0   Recent Labs  Lab 03/06/19 1105  LIPASE 32   No results for input(s): AMMONIA in the last 168 hours. CBC: Recent Labs  Lab 03/06/19 1105  WBC 8.6  NEUTROABS 6.5    HGB 14.9  HCT 44.9  MCV 91.4  PLT 271   Cardiac Enzymes: No results for input(s): CKTOTAL, CKMB, CKMBINDEX, TROPONINI in the last 168 hours.  BNP (last 3 results) No results for input(s): BNP in the last 8760 hours.  ProBNP (last 3 results) No results for input(s): PROBNP in the last 8760 hours.  CBG: Recent Labs  Lab 03/06/19 1639  GLUCAP 79    Radiological Exams on Admission: Dg Chest Portable 1 View  Result Date: 03/06/2019 CLINICAL DATA:  Chest pain. EXAM: PORTABLE CHEST 1 VIEW COMPARISON:  None. FINDINGS: The heart size and mediastinal contours are within normal limits. Both lungs are clear. No pneumothorax or pleural effusion is noted. The visualized skeletal structures are unremarkable. IMPRESSION: No active disease. Electronically Signed   By: Marijo Conception M.D.   On: 03/06/2019 11:43   US Abdomen Limited Ruq  Result Date: 03/06/2019 CLINICAL DATA:  Right upper quadrant pain. EXAM: ULTRASOUND ABDOMEN LIMITED RIGHT UPPER QUADRANT COMPARISON:  None. FINDINGS: Gallbladder: Cholelithiasis is identified. The gallbladder wall thickness measures 3.3 mm. No Murphy's sign or pericholecystic fluid identified. Common bile duct: Diameter: 6.6 mm Liver: Increased echogenicity with no focal mass. Portal vein is patent on color Doppler imaging with normal direction of blood flow towards the liver. IMPRESSION: 1. The common bile duct measures 6.6 mm. 6 mm is the upper limits of normal. Recommend correlation with LFTs. If there is concern for biliary obstruction, recommend an MRCP. 2. Several small stones are seen in the gallbladder. The gallbladder wall thickness of 3.3 mm is borderline. 3 mm is considered the upper limits of normal. No pericholecystic fluid or Murphy's sign. Recommend attention to the gallbladder if the patient receives an MRCP. Otherwise, if the patient does not have an MRCP and there is concern for acute cholecystitis, recommend HIDA scan. 3. Increased echogenicity in the  liver is nonspecific but often seen with hepatic steatosis. Electronically Signed   By: Dorise Bullion III M.D   On: 03/06/2019 13:59    EKG: Independently reviewed.  Sinus rhythm, with no acute ST changes  Assessment/Plan Present on Admission:  Choledocholithiasis with obstruction  HTN (hypertension)  Principal Problem:   Choledocholithiasis with obstruction Active Problems:   HTN (hypertension)   DM (diabetes mellitus) (HCC)   GERD (gastroesophageal reflux disease)   Choledocholithiasis with obstruction Afebrile, no leukocytosis Elevated alkaline phosphatase at 215, AST 662, ALT 457 Hepatitis panel pending Troponin negative, EKG with no acute ST changes Limited right upper quadrant ultrasound showed common bile duct measuring about 6.6 mm, with several small stones in the gallbladder, recommended an MRCP MRCP pending EDP consulted [facility] Dr. Penelope Coop, who will see patient tomorrow. Kept n.p.o. at midnight for possible ERCP Pain  management, antiemetics  Hypertension Continue PTA lisinopril  Diabetes mellitus type 2 A1c pending SSI, Accu-Cheks, hypoglycemic protocol Hold home glipizide  Hyperlipidemia Continue statins  GERD Continue PPI       DVT prophylaxis: Lovenox  Code Status: Full  Family Communication: None at bedside  Disposition Plan: To be determined  Consults called: EDP consulted GI  Admission status: Inpatient    Alma Friendly MD [facility]   If 7PM-7AM, please contact night-coverage [email or web address]  03/06/2019, 6:05 PM

## 2019-03-06 NOTE — Progress Notes (Signed)
This RN called back for report at 1713. Patient was transferred to MRI and will be transferred to [place] after MRI done.

## 2019-03-07 ENCOUNTER — Encounter (HOSPITAL_COMMUNITY)
Admission: EM | Disposition: A | Discharge status: Home / Self Care | Payer: Self-pay | Source: Ambulatory Visit | Attending: Internal Medicine

## 2019-03-07 DIAGNOSIS — E785 Hyperlipidemia, unspecified: Secondary | ICD-10-CM

## 2019-03-07 DIAGNOSIS — R1013 Epigastric pain: Secondary | ICD-10-CM

## 2019-03-07 DIAGNOSIS — R945 Abnormal results of liver function studies: Secondary | ICD-10-CM

## 2019-03-07 DIAGNOSIS — K8020 Calculus of gallbladder without cholecystitis without obstruction: Secondary | ICD-10-CM

## 2019-03-07 LAB — CBC
HCT: 44.1 % (ref 36.0–46.0)
Hemoglobin: 14.6 g/dL (ref 12.0–15.0)
MCH: 30.5 pg (ref 26.0–34.0)
MCHC: 33.1 g/dL (ref 30.0–36.0)
MCV: 92.3 fL (ref 80.0–100.0)
Platelets: 310 10*3/uL (ref 150–400)
RBC: 4.78 MIL/uL (ref 3.87–5.11)
RDW: 13.5 % (ref 11.5–15.5)
WBC: 9.4 10*3/uL (ref 4.0–10.5)
nRBC: 0.9 % — ABNORMAL HIGH (ref 0.0–0.2)

## 2019-03-07 LAB — GLUCOSE, CAPILLARY
Glucose-Capillary: 154 mg/dL — ABNORMAL HIGH (ref 70–99)
Glucose-Capillary: 88 mg/dL (ref 70–99)
Glucose-Capillary: 126 mg/dL — ABNORMAL HIGH (ref 70–99)

## 2019-03-07 LAB — COMPREHENSIVE METABOLIC PANEL
ALT: 366 U/L — ABNORMAL HIGH (ref 0–44)
AST: 335 U/L — ABNORMAL HIGH (ref 15–41)
Albumin: 3.6 g/dL (ref 3.5–5.0)
Alkaline Phosphatase: 282 U/L — ABNORMAL HIGH (ref 38–126)
Anion gap: 9 (ref 5–15)
BUN: 11 mg/dL (ref 8–23)
CO2: 27 mmol/L (ref 22–32)
Calcium: 8.7 mg/dL — ABNORMAL LOW (ref 8.9–10.3)
Chloride: 105 mmol/L (ref 98–111)
Creatinine, Ser: 0.71 mg/dL (ref 0.44–1.00)
GFR calc Af Amer: >60 mL/min (ref >60)
GFR calc non Af Amer: >60 mL/min (ref >60)
Glucose, Bld: 165 mg/dL — ABNORMAL HIGH (ref 70–99)
Potassium: 3.9 mmol/L (ref 3.5–5.1)
Sodium: 141 mmol/L (ref 135–145)
Total Bilirubin: 2.7 mg/dL — ABNORMAL HIGH (ref 0.3–1.2)
Total Protein: 6.2 g/dL — ABNORMAL LOW (ref 6.5–8.1)

## 2019-03-07 LAB — HEMOGLOBIN A1C
Hgb A1c MFr Bld: 7.5 % — ABNORMAL HIGH (ref 4.8–5.6)
Mean Plasma Glucose: 168.55 mg/dL

## 2019-03-07 LAB — HEPATITIS PANEL, ACUTE
HCV Ab: <0.1 s/co ratio (ref 0.0–0.9)
Hep A IgM: NEGATIVE
Hep B C IgM: NEGATIVE
Hepatitis B Surface Ag: NEGATIVE

## 2019-03-07 LAB — HIV ANTIBODY (ROUTINE TESTING W REFLEX): HIV Screen 4th Generation wRfx: NONREACTIVE

## 2019-03-07 SURGERY — ERCP, WITH INTERVENTION IF INDICATED
Anesthesia: General

## 2019-03-07 MED ORDER — SODIUM CHLORIDE 0.9 % IV SOLN
INTRAVENOUS | Status: DC
  Administered 2019-03-07: 19:00:00 via INTRAVENOUS

## 2019-03-07 MED ORDER — SODIUM CHLORIDE 0.9 % IV SOLN: INTRAVENOUS | Status: DC

## 2019-03-07 MED ORDER — ADULT MULTIVITAMIN W/MINERALS CH
1.0000 | ORAL_TABLET | Freq: Every day | ORAL | Status: DC
  Administered 2019-03-07 – 2019-03-10 (×4): 1 via ORAL
  Filled 2019-03-07 (×4): qty 1

## 2019-03-07 MED ORDER — ENOXAPARIN SODIUM 40 MG/0.4ML ~~LOC~~ SOLN
40.0000 mg | SUBCUTANEOUS | Status: DC
  Administered 2019-03-07 – 2019-03-09 (×3): 40 mg via SUBCUTANEOUS
  Filled 2019-03-07 (×3): qty 0.4

## 2019-03-07 MED ORDER — ASPIRIN EC 81 MG PO TBEC
81.0000 mg | DELAYED_RELEASE_TABLET | Freq: Every day | ORAL | Status: DC
  Administered 2019-03-09 – 2019-03-10 (×2): 81 mg via ORAL
  Filled 2019-03-07 (×2): qty 1

## 2019-03-07 MED ORDER — FISH OIL 1000 MG PO CAPS: 1000.0000 mg | ORAL_CAPSULE | Freq: Every day | ORAL | Status: DC

## 2019-03-07 MED ORDER — VITAMIN D 25 MCG (1000 UNIT) PO TABS
1000.0000 [IU] | ORAL_TABLET | Freq: Every day | ORAL | Status: DC
  Administered 2019-03-07 – 2019-03-10 (×4): 1000 [IU] via ORAL
  Filled 2019-03-07 (×4): qty 1

## 2019-03-07 MED ORDER — OMEGA-3-ACID ETHYL ESTERS 1 G PO CAPS
1.0000 g | ORAL_CAPSULE | Freq: Every day | ORAL | Status: DC
  Administered 2019-03-08 – 2019-03-10 (×3): 1 g via ORAL
  Filled 2019-03-07 (×3): qty 1

## 2019-03-07 NOTE — Progress Notes (Signed)
PROGRESS NOTE    Kendra Miles  CZY:[id number] DOB: [birth year] DOA: 03/06/2019 PCP: Simona Huh, NP   Brief Narrative:  HPI per Dr. Lesia Sago on 03/06/2019 Kendra Miles is a 68 y.o. female with medical history significant for hypertension, hyperlipidemia, GERD, DM, hiatal hernia presents to the ED complaining of chest discomfort for for the past 1 day.  Patient reported pain more in the epigastric region and some radiation to the back.  Reports the pain woke her up around 2 AM, was a 9/10, sharp pain.  Today patient went to see her primary care, who noticed changes on her EKG and sent her to the ED.  Patient currently rates her pain as a 4/10, denies any nausea/vomiting, diarrhea, fever/chills, cough, shortness of breath, dizziness.  No sick contacts.  ED Course: In the ED, patient vital signs stable, labs showed alkaline phosphatase elevated at 215, AST 662, ALT 457, T bili 1.6.  Troponin levels were less than and repeat was 10.  EKG showed normal sinus rhythm, with no acute ST changes.  Limited right upper quadrant ultrasound showed common bile duct measuring about 6.6 mm, with several small stones in the gallbladder, recommended an MRCP.  EDP consulted GI Dr. Penelope Coop, who recommended an MRCP and will see patient tomorrow.  Hospitalist consulted for admission.  **Interim History Gastroenterology was consulted and they are recommending ERCP however this is unfortunately unable to be done today due to lack anesthesia coverage and likely will be done tomorrow.  Gastroenterology also recommends general surgery consultation for cholecystectomy.  Patient abdominal pain is improved and she will be placed on a clear liquid diet currently n.p.o. after midnight for ERCP in the morning  Assessment & Plan:   Principal Problem:   Choledocholithiasis with obstruction Active Problems:   HTN (hypertension)   DM (diabetes mellitus) (HCC)   GERD (gastroesophageal reflux disease)  Symptomatic  Cholelithiasis Choledocholithiasis without obstruction Abnormal LFT's  Hyperbilirubinemia  -Afebrile, no leukocytosis (9.4) -Elevated alkaline phosphatase at 215, AST 662, ALT 457 on Admission -Now Alk Phos is 282, AST is 335, and ALT is 366 ? 2/2 to Passing of Stone  -Hepatitis panel negative  -Troponin negative, EKG with no acute ST changes -Limited right upper quadrant ultrasound showed common bile duct measuring about 6.6 mm, with several small stones in the gallbladder, recommended an MRCP -MRCP done and showed "Cholelithiasis. Extrahepatic common duct measures upper normal for patient age. There is a tiny 4 mm stone in the distal common bile duct.  1.1 cm well-defined mildly heterogeneous lesion in the upper spleen. This cannot be completely characterized due to substantial motion degradation on the postcontrast imaging. Features may be related to a complex cyst/pseudocyst, potentially with rim calcification." -Gastroenterology was consulted and Dr. Therisa Doyne evaluated the patient today and recommends ERCP however unfortunately unable to be done today so will be done first thing in the morning and patient will be n.p.o. at midnight; Dr. Vicente Serene place the patient on a clear liquid diet -General surgery was also consulted for evaluation for cholecystectomy and will defer timing of this to them. -In the interim patient received IV fluids with 1 L normal saline at a rate of 75 mL's per hour and we will continue pain control with 2 mg of IV morphine every 4 PRN for severe pain as well as antiemetics with 4 mg p.o./IV of Zofran every 6 hours as needed for nausea -Also will continue tramadol 50 mg p.o. every 6 hours as needed for moderate pain -Continue to  monitor and trend hepatic function panel and repeat CBC and CMP in a.m.  Hypertension -Continue PTA lisinopril 2.5 mg po Daily   Diabetes Mellitus Type 2 -HbA1c was 7.5 -C/w Sensitive SSI, Accu-Cheks, Hypoglycemic protocol -Hold Home  Glipizide -CBG's ranging from 79-154; Blood Sugar on CMP this AM was 165 -Continue to Monitor and adjust Insulin Scale as Needed   Hyperlipidemia -C/w Rosuvastatin 10 mg po Daily   GERD/Acid Reflux -Continue PPI with Pantoprazole 40 mg po Daily   Obesity -Estimated body mass index is 32.81 kg/m as calculated from the following:   Height as of this encounter: _0  (1.676 m).   Weight as of this encounter: 92.2 kg. -Weight Loss and Dietary Counseling given   DVT prophylaxis: Enoxaparin 40 mg sq q24h Code Status: FULL CODE  Family Communication: No family present at bedside  Disposition Plan: Pending Further GI and Surgical Workup   Consultants:   Gastroenterology   Cardiology    Procedures: MRCP  ERCP Scheduled for tomorrow    Antimicrobials:  Anti-infectives (From admission, onward)   None     Subjective: Seen and examined at bedside and states that she was feeling better today and was not complaining of any abdominal pain.  Denied chest pain, headaches or dizziness.  No nausea or vomiting.  No other concerns or complaints at this time and understands the plan of care.  Objective: Vitals:   03/06/19 1633 03/06/19 1843 03/06/19 2005 03/07/19 0540  BP: (!) 164/91 (!) 170/81 (!) 161/91 133/84  Pulse: 90 86 80 88  Resp: (!) _1 Temp:  98.3 F (36.8 C) 98.2 F (36.8 C) 98.9 F (37.2 C)  TempSrc:  Oral Oral Oral  SpO2: 96% 97% 94% 91%  Weight:  92.2 kg    Height:  _2  (1.676 m)      Intake/Output Summary (Last 24 hours) at 03/07/2019 1107 Last data filed at 03/07/2019 0700 Gross per 24 hour  Intake 268.8 ml  Output 1400 ml  Net -1131.2 ml   Filed Weights   03/06/19 1043 03/06/19 1843  Weight: 93 kg 92.2 kg   Examination: Physical Exam:  Constitutional: WN/WD obese Caucasian female and NAD and appears calm and comfortable Eyes: Lids and conjunctivae normal, sclerae anicteric  ENMT: External Ears, Nose appear normal. Grossly normal  hearing. Mucous membranes are moist.  Neck: Appears normal, supple, no cervical masses, normal ROM, no appreciable thyromegaly; no JVD Respiratory: Diminished to auscultation bilaterally, no wheezing, rales, rhonchi or crackles. Normal respiratory effort and patient is not tachypenic. No accessory muscle use.  Cardiovascular: RRR, no murmurs / rubs / gallops. S1 and S2 auscultated. Trace extremity edema. Abdomen: Soft, non-tender, Distended due to body habitus. No masses palpated. No appreciable hepatosplenomegaly. Bowel sounds positive x4.  GU: Deferred. Musculoskeletal: No clubbing / cyanosis of digits/nails. No joint deformity upper and lower extremities.  Skin: No rashes, lesions, ulcers on a limited skin evaluation. No induration; Warm and dry.  Neurologic: CN 2-12 grossly intact with no focal deficits.  Romberg sign and cerebellar reflexes not assessed.  Psychiatric: Normal judgment and insight. Alert and oriented x 3. Normal mood and appropriate affect.   Data Reviewed: I have personally reviewed following labs and imaging studies  CBC: Recent Labs  Lab 03/06/19 1105 03/07/19 0558  WBC 8.6 9.4  NEUTROABS 6.5  --   HGB 14.9 14.6  HCT 44.9 44.1  MCV 91.4 92.3  PLT 271 469   Basic Metabolic Panel: Recent Labs  Lab 03/06/19 1105 03/07/19 0558  NA 142 141  K 3.9 3.9  CL 104 105  CO2 28 27  GLUCOSE 123* 165*  BUN 13 11  CREATININE 0.75 0.71  CALCIUM 9.3 8.7*   GFR: Estimated Creatinine Clearance: 80.2 mL/min (by C-G formula based on SCr of 0.71 mg/dL). Liver Function Tests: Recent Labs  Lab 03/06/19 1105 03/07/19 0558  AST 662* 335*  ALT 457* 366*  ALKPHOS 215* 282*  BILITOT 1.6* 2.7*  PROT 6.7 6.2*  ALBUMIN 4.0 3.6   Recent Labs  Lab 03/06/19 1105  LIPASE 32   No results for input(s): AMMONIA in the last 168 hours. Coagulation Profile: Recent Labs  Lab 03/06/19 1105  INR 1.0   Cardiac Enzymes: No results for input(s): CKTOTAL, CKMB, CKMBINDEX,  TROPONINI in the last 168 hours. BNP (last 3 results) No results for input(s): PROBNP in the last 8760 hours. HbA1C: Recent Labs    03/07/19 0558  HGBA1C 7.5*   CBG: Recent Labs  Lab 03/06/19 1639 03/07/19 0710  GLUCAP 79 154*   Lipid Profile: No results for input(s): CHOL, HDL, LDLCALC, TRIG, CHOLHDL, LDLDIRECT in the last 72 hours. Thyroid Function Tests: No results for input(s): TSH, T4TOTAL, FREET4, T3FREE, THYROIDAB in the last 72 hours. Anemia Panel: No results for input(s): VITAMINB12, FOLATE, FERRITIN, TIBC, IRON, RETICCTPCT in the last 72 hours. Sepsis Labs: No results for input(s): PROCALCITON, LATICACIDVEN in the last 168 hours.  Recent Results (from the past 240 hour(s))  SARS Coronavirus 2 (CEPHEID - Performed in [place] hospital lab), Hosp Order     Status: None   Collection Time: 03/06/19  2:36 PM   Specimen: Nasopharyngeal Swab  Result Value Ref Range Status   SARS Coronavirus 2 NEGATIVE NEGATIVE Final    Comment: (NOTE) If result is NEGATIVE SARS-CoV-2 target nucleic acids are NOT DETECTED. The SARS-CoV-2 RNA is generally detectable in upper and lower  respiratory specimens during the acute phase of infection. The lowest  concentration of SARS-CoV-2 viral copies this assay can detect is 250  copies / mL. A negative result does not preclude SARS-CoV-2 infection  and should not be used as the sole basis for treatment or other  patient management decisions.  A negative result may occur with  improper specimen collection / handling, submission of specimen other  than nasopharyngeal swab, presence of viral mutation(s) within the  areas targeted by this assay, and inadequate number of viral copies  (<250 copies / mL). A negative result must be combined with clinical  observations, patient history, and epidemiological information. If result is POSITIVE SARS-CoV-2 target nucleic acids are DETECTED. The SARS-CoV-2 RNA is generally detectable in upper and  lower  respiratory specimens dur ing the acute phase of infection.  Positive  results are indicative of active infection with SARS-CoV-2.  Clinical  correlation with patient history and other diagnostic information is  necessary to determine patient infection status.  Positive results do  not rule out bacterial infection or co-infection with other viruses. If result is PRESUMPTIVE POSTIVE SARS-CoV-2 nucleic acids MAY BE PRESENT.   A presumptive positive result was obtained on the submitted specimen  and confirmed on repeat testing.  While 2019 novel coronavirus  (SARS-CoV-2) nucleic acids may be present in the submitted sample  additional confirmatory testing may be necessary for epidemiological  and / or clinical management purposes  to differentiate between  SARS-CoV-2 and other Sarbecovirus currently known to infect humans.  If clinically indicated additional testing with an alternate  test  methodology [phone]) is advised. The SARS-CoV-2 RNA is generally  detectable in upper and lower respiratory sp ecimens during the acute  phase of infection. The expected result is Negative. Fact Sheet for Patients:  StrictlyIdeas.no Fact Sheet for Healthcare Providers: BankingDealers.co.za This test is not yet approved or cleared by the [place] FDA and has been authorized for detection and/or diagnosis of SARS-CoV-2 by FDA under an Emergency Use Authorization (EUA).  This EUA will remain in effect (meaning this test can be used) for the duration of the COVID-19 declaration under Section 564(b)(1) of the Act, 21 U.S.C. section 360bbb-3(b)(1), unless the authorization is terminated or revoked sooner. Performed at [facility], [place] [address], [place], [place] [address]     Radiology Studies: Mr 3d Recon At Scanner  Result Date: 03/06/2019 CLINICAL DATA:  Cholelithiasis. EXAM: MRI ABDOMEN WITHOUT AND WITH CONTRAST  (INCLUDING MRCP) TECHNIQUE: Multiplanar multisequence MR imaging of the abdomen was performed both before and after the administration of intravenous contrast. Heavily T2-weighted images of the biliary and pancreatic ducts were obtained, and three-dimensional MRCP images were rendered by post processing. CONTRAST:  10 cc Gadavist COMPARISON:  Ultrasound exam earlier today. FINDINGS: Lower chest: Large hiatal hernia. Hepatobiliary: Mild diffuse fatty deposition noted within the liver parenchyma. No focal hepatic mass lesion. Differential perfusion of the liver noted on arterial phase imaging. Portal vein and hepatic veins are patent. Tiny layering gallstones are evident. Common duct measures 7 mm diameter. Common bile duct in the head of the pancreas measures 4 mm diameter. There is a tiny stone visible in the distal common bile duct on MRCP imaging (see image 36 of series 6 and image 2 of series 9. Pancreas: No gross abnormality although fine detail obscured by motion artifact. No dilatation of the main pancreatic duct. Spleen: 1.1 cm T1 hyperintense, T2 hypointense lesion is identified in the upper spleen, well-marginated and slightly heterogeneous with a margin that is nearly signal void. Contrast enhancement characteristics of this lesion cannot be determined due to substantial motion degradation. Adrenals/Urinary Tract: No adrenal nodule or mass. No hydronephrosis in either kidney. Tiny subcapsular cyst noted upper pole right kidney. Stomach/Bowel: Large hiatal hernia. No small bowel or colonic dilatation within the visualized abdomen Vascular/Lymphatic: No abdominal aortic aneurysm. No abdominal lymphadenopathy. Other:  No intraperitoneal free fluid. Musculoskeletal: No abnormal marrow enhancement within the visualized bony anatomy. IMPRESSION: 1. Cholelithiasis. Extrahepatic common duct measures upper normal for patient age. There is a tiny 4 mm stone in the distal common bile duct. 2. 1.1 cm well-defined  mildly heterogeneous lesion in the upper spleen. This cannot be completely characterized due to substantial motion degradation on the postcontrast imaging. Features may be related to a complex cyst/pseudocyst, potentially with rim calcification. Electronically Signed   By: Misty Stanley M.D.   On: 03/06/2019 18:32   Dg Chest Portable 1 View  Result Date: 03/06/2019 CLINICAL DATA:  Chest pain. EXAM: PORTABLE CHEST 1 VIEW COMPARISON:  None. FINDINGS: The heart size and mediastinal contours are within normal limits. Both lungs are clear. No pneumothorax or pleural effusion is noted. The visualized skeletal structures are unremarkable. IMPRESSION: No active disease. Electronically Signed   By: Marijo Conception M.D.   On: 03/06/2019 11:43   Mr Abdomen Mrcp Moise Boring Contast  Result Date: 03/06/2019 CLINICAL DATA:  Cholelithiasis. EXAM: MRI ABDOMEN WITHOUT AND WITH CONTRAST (INCLUDING MRCP) TECHNIQUE: Multiplanar multisequence MR imaging of the abdomen was performed both before and after the administration of  intravenous contrast. Heavily T2-weighted images of the biliary and pancreatic ducts were obtained, and three-dimensional MRCP images were rendered by post processing. CONTRAST:  10 cc Gadavist COMPARISON:  Ultrasound exam earlier today. FINDINGS: Lower chest: Large hiatal hernia. Hepatobiliary: Mild diffuse fatty deposition noted within the liver parenchyma. No focal hepatic mass lesion. Differential perfusion of the liver noted on arterial phase imaging. Portal vein and hepatic veins are patent. Tiny layering gallstones are evident. Common duct measures 7 mm diameter. Common bile duct in the head of the pancreas measures 4 mm diameter. There is a tiny stone visible in the distal common bile duct on MRCP imaging (see image 36 of series 6 and image 2 of series 9. Pancreas: No gross abnormality although fine detail obscured by motion artifact. No dilatation of the main pancreatic duct. Spleen: 1.1 cm T1  hyperintense, T2 hypointense lesion is identified in the upper spleen, well-marginated and slightly heterogeneous with a margin that is nearly signal void. Contrast enhancement characteristics of this lesion cannot be determined due to substantial motion degradation. Adrenals/Urinary Tract: No adrenal nodule or mass. No hydronephrosis in either kidney. Tiny subcapsular cyst noted upper pole right kidney. Stomach/Bowel: Large hiatal hernia. No small bowel or colonic dilatation within the visualized abdomen Vascular/Lymphatic: No abdominal aortic aneurysm. No abdominal lymphadenopathy. Other:  No intraperitoneal free fluid. Musculoskeletal: No abnormal marrow enhancement within the visualized bony anatomy. IMPRESSION: 1. Cholelithiasis. Extrahepatic common duct measures upper normal for patient age. There is a tiny 4 mm stone in the distal common bile duct. 2. 1.1 cm well-defined mildly heterogeneous lesion in the upper spleen. This cannot be completely characterized due to substantial motion degradation on the postcontrast imaging. Features may be related to a complex cyst/pseudocyst, potentially with rim calcification. Electronically Signed   By: Misty Stanley M.D.   On: 03/06/2019 18:32   US Abdomen Limited Ruq  Result Date: 03/06/2019 CLINICAL DATA:  Right upper quadrant pain. EXAM: ULTRASOUND ABDOMEN LIMITED RIGHT UPPER QUADRANT COMPARISON:  None. FINDINGS: Gallbladder: Cholelithiasis is identified. The gallbladder wall thickness measures 3.3 mm. No Murphy's sign or pericholecystic fluid identified. Common bile duct: Diameter: 6.6 mm Liver: Increased echogenicity with no focal mass. Portal vein is patent on color Doppler imaging with normal direction of blood flow towards the liver. IMPRESSION: 1. The common bile duct measures 6.6 mm. 6 mm is the upper limits of normal. Recommend correlation with LFTs. If there is concern for biliary obstruction, recommend an MRCP. 2. Several small stones are seen in the  gallbladder. The gallbladder wall thickness of 3.3 mm is borderline. 3 mm is considered the upper limits of normal. No pericholecystic fluid or Murphy's sign. Recommend attention to the gallbladder if the patient receives an MRCP. Otherwise, if the patient does not have an MRCP and there is concern for acute cholecystitis, recommend HIDA scan. 3. Increased echogenicity in the liver is nonspecific but often seen with hepatic steatosis. Electronically Signed   By: Dorise Bullion III M.D   On: 03/06/2019 13:59   Scheduled Meds:  Derrill Memo ON 03/08/2019] aspirin EC  81 mg Oral Daily   enoxaparin (LOVENOX) injection  40 mg Subcutaneous Q24H   insulin aspart  0-9 Units Subcutaneous TID WC   lisinopril  2.5 mg Oral Daily   pantoprazole  40 mg Oral Daily   rosuvastatin  10 mg Oral Daily   Continuous Infusions:   LOS: 1 day   Kerney Elbe, DO [facility] PAGER is on [place]  If 7PM-7AM, please contact  night-coverage [email or web address] Password [facility] 03/07/2019, 11:07 AM

## 2019-03-07 NOTE — Consult Note (Addendum)
[facility] Consult  Referring Provider: Alma Friendly, MD Primary Care Physician:  Simona Huh, NP Primary Gastroenterologist: Althia Forts  Reason for Consultation:  CBD stone  HPI: Kendra Miles is a 68 y.o. female presented to the ER yesterday with complains of severe lower sternal pain with radiation to the back which woke her up at 2 AM and was associated with nausea and vomiting.  Patient states that for the last 2 years she has had intermittent episodes of such pain which she believed was related to acid reflux and takes omeprazole over-the-counter daily.  She has not had any fevers, difficulty swallowing, pain on swallowing, change in bowel habits, blood in stool or black stools, unintentional weight loss. She reports having a colonoscopy several years ago at [facility] and was due for a repeat colonoscopy in January of this year. She recalls having an EGD several years ago and being told that she has a hiatal hernia. In the ER she was found to have elevated liver enzymes, AST 662, ALT 457, ALP 215 and T bili 1.6 without associated leukocytosis and normal lipase. She subsequently had an MRI/MRCP which showed tiny layering gallstones, CBD of 7 mm, CBD stone of 4 mm, normal pancreas, large hiatal hernia. Ultrasound had shown a CBD of 6.6 mm, gallbladder wall thickness of 3.3 mm and hepatic steatosis.   Past Medical History:  Diagnosis Date  . Acid reflux     Past Surgical History:  Procedure Laterality Date  . ABDOMINAL HYSTERECTOMY     partial  . APPENDECTOMY    . CYST REMOVAL TRUNK      Prior to Admission medications   Medication Sig Start Date End Date Taking? Authorizing Provider  aspirin 81 MG tablet Take 81 mg by mouth daily.   Yes [provider]  cholecalciferol (VITAMIN D3) 25 MCG (1000 UT) tablet Take 1,000 Units by mouth daily.   Yes [provider]  glipiZIDE (GLUCOTROL) 10 MG tablet Take 10 mg by mouth daily. 12/20/18  Yes  [provider]  lisinopril (ZESTRIL) 2.5 MG tablet Take 2.5 mg by mouth daily. 02/15/19  Yes [provider]  Multiple Vitamin (MULTIVITAMIN WITH MINERALS) TABS tablet Take 1 tablet by mouth daily.   Yes [provider]  Omega-3 Fatty Acids (FISH OIL) 1000 MG CAPS Take 1,000 mg by mouth daily.   Yes [provider]  omeprazole (PRILOSEC) 20 MG capsule Take 20 mg by mouth daily.   Yes [provider]  rosuvastatin (CRESTOR) 10 MG tablet  03/03/19  Yes [provider]  amoxicillin-clavulanate (AUGMENTIN) 875-125 MG per tablet Take 1 tablet by mouth 2 (two) times daily. Patient not taking: Reported on 03/06/2019 09/19/13   Schinlever, Barnetta Chapel, PA-C    Current Facility-Administered Medications  Medication Dose Route Frequency Provider Last Rate Last Dose  . aspirin EC tablet 81 mg  81 mg Oral Daily Alma Friendly, MD      . enoxaparin (LOVENOX) injection 40 mg  40 mg Subcutaneous Q24H Alma Friendly, MD   40 mg at 03/06/19 2116  . insulin aspart (novoLOG) injection 0-9 Units  0-9 Units Subcutaneous TID WC Alma Friendly, MD   2 Units at 03/07/19 802 130 0116  . lisinopril (ZESTRIL) tablet 2.5 mg  2.5 mg Oral Daily Alma Friendly, MD      . morphine 2 MG/ML injection 2 mg  2 mg Intravenous Q4H PRN Alma Friendly, MD      . ondansetron [facility]) tablet 4 mg  4 mg Oral Q6H PRN Alma Friendly, MD       Or  . ondansetron [facility]) injection 4 mg  4 mg Intravenous Q6H PRN Alma Friendly, MD      . pantoprazole (PROTONIX) EC tablet 40 mg  40 mg Oral Daily Alma Friendly, MD      . polyethylene glycol (MIRALAX / GLYCOLAX) packet 17 g  17 g Oral Daily PRN Alma Friendly, MD      . rosuvastatin (CRESTOR) tablet 10 mg  10 mg Oral Daily Alma Friendly, MD      . traMADol [facility]at[facility]bon) tablet 50 mg  50 mg Oral Q6H PRN Alma Friendly, MD        Allergies as of 03/06/2019  . (No Known Allergies)     Family History  Problem Relation Age of Onset  . COPD Father     Social History   Socioeconomic History  . Marital status: Divorced    Spouse name: Not on file  . Number of children: Not on file  . Years of education: Not on file  . Highest education level: Not on file  Occupational History  . Not on file  Social Needs  . Financial resource strain: Not on file  . Food insecurity    Worry: Not on file    Inability: Not on file  . Transportation needs    Medical: Not on file    Non-medical: Not on file  Tobacco Use  . Smoking status: Never Smoker  . Smokeless tobacco: Never Used  Substance and Sexual Activity  . Alcohol use: No  . Drug use: Never  . Sexual activity: Not on file  Lifestyle  . Physical activity    Days per week: Not on file    Minutes per session: Not on file  . Stress: Not on file  Relationships  . Social Herbalist on phone: Not on file    Gets together: Not on file    Attends religious service: Not on file    Active member of club or organization: Not on file    Attends meetings of clubs or organizations: Not on file    Relationship status: Not on file  . Intimate partner violence    Fear of current or ex partner: Not on file    Emotionally abused: Not on file    Physically abused: Not on file    Forced sexual activity: Not on file  Other Topics Concern  . Not on file  Social History Narrative  . Not on file    Review of Systems: GI: Described in detail in HPI.    Gen: Denies any fever, chills, rigors, night sweats, anorexia, fatigue, weakness, malaise, involuntary weight loss, and sleep disorder CV: Denies chest pain, angina, palpitations, syncope, orthopnea, PND, peripheral edema, and claudication. Resp: Denies dyspnea, cough, sputum, wheezing, coughing up blood. GU : Denies urinary burning, blood in urine, urinary frequency, urinary hesitancy, nocturnal urination, and urinary incontinence. MS: Denies joint pain or  swelling.  Denies muscle weakness, cramps, atrophy.  Derm: Denies rash, itching, oral ulcerations, hives, unhealing ulcers.  Psych: Denies depression, anxiety, memory loss, suicidal ideation, hallucinations,  and confusion. Heme: Denies bruising, bleeding, and enlarged lymph nodes. Neuro:  Denies any headaches, dizziness, paresthesias. Endo:  Denies any problems with DM, thyroid, adrenal function.  Physical Exam: Vital signs in last 24 hours: Temp:  [98.2 F (36.8 C)-98.9 F (37.2 C)] 98.9 F (37.2 C) (07/17  0540) Pulse Rate:  [55-90] 88 (07/17 0540) Resp:  [17-24] 17 (07/17 0540) BP: (131-170)/(75-97) 133/84 (07/17 0540) SpO2:  [91 %-98 %] 91 % (07/17 0540) Weight:  [92.2 kg] 92.2 kg (07/16 1843) Last BM Date: 03/06/19  General:   Alert,  Well-developed, well-nourished, pleasant and cooperative in NAD Head:  Normocephalic and atraumatic. Eyes:  Sclera clear, mild icterus.   Conjunctiva pink. Ears:  Normal auditory acuity. Nose:  No deformity, discharge,  or lesions. Mouth:  No deformity or lesions.  Oropharynx pink & moist. Neck:  Supple; no masses or thyromegaly. Lungs:  Clear throughout to auscultation.   No wheezes, crackles, or rhonchi. No acute distress. Heart:  Regular rate and rhythm; no murmurs, clicks, rubs,  or gallops. Extremities:  Without clubbing or edema. Neurologic:  Alert and  oriented x4;  grossly normal neurologically. Skin:  Intact without significant lesions or rashes. Psych:  Alert and cooperative. Normal mood and affect. Abdomen:  Soft, nontender and nondistended. No masses, hepatosplenomegaly or hernias noted. Normal bowel sounds, without guarding, and without rebound.         Lab Results: Recent Labs    03/06/19 1105 03/07/19 0558  WBC 8.6 9.4  HGB 14.9 14.6  HCT 44.9 44.1  PLT 271 310   BMET Recent Labs    03/06/19 1105 03/07/19 0558  NA 142 141  K 3.9 3.9  CL 104 105  CO2 28 27  GLUCOSE 123* 165*  BUN 13 11  CREATININE 0.75 0.71   CALCIUM 9.3 8.7*   LFT Recent Labs    03/07/19 0558  PROT 6.2*  ALBUMIN 3.6  AST 335*  ALT 366*  ALKPHOS 282*  BILITOT 2.7*   PT/INR Recent Labs    03/06/19 1105  LABPROT 12.7  INR 1.0    Studies/Results: Mr 3d Recon At Scanner  Result Date: 03/06/2019 CLINICAL DATA:  Cholelithiasis. EXAM: MRI ABDOMEN WITHOUT AND WITH CONTRAST (INCLUDING MRCP) TECHNIQUE: Multiplanar multisequence MR imaging of the abdomen was performed both before and after the administration of intravenous contrast. Heavily T2-weighted images of the biliary and pancreatic ducts were obtained, and three-dimensional MRCP images were rendered by post processing. CONTRAST:  10 cc Gadavist COMPARISON:  Ultrasound exam earlier today. FINDINGS: Lower chest: Large hiatal hernia. Hepatobiliary: Mild diffuse fatty deposition noted within the liver parenchyma. No focal hepatic mass lesion. Differential perfusion of the liver noted on arterial phase imaging. Portal vein and hepatic veins are patent. Tiny layering gallstones are evident. Common duct measures 7 mm diameter. Common bile duct in the head of the pancreas measures 4 mm diameter. There is a tiny stone visible in the distal common bile duct on MRCP imaging (see image 36 of series 6 and image 2 of series 9. Pancreas: No gross abnormality although fine detail obscured by motion artifact. No dilatation of the main pancreatic duct. Spleen: 1.1 cm T1 hyperintense, T2 hypointense lesion is identified in the upper spleen, well-marginated and slightly heterogeneous with a margin that is nearly signal void. Contrast enhancement characteristics of this lesion cannot be determined due to substantial motion degradation. Adrenals/Urinary Tract: No adrenal nodule or mass. No hydronephrosis in either kidney. Tiny subcapsular cyst noted upper pole right kidney. Stomach/Bowel: Large hiatal hernia. No small bowel or colonic dilatation within the visualized abdomen Vascular/Lymphatic: No  abdominal aortic aneurysm. No abdominal lymphadenopathy. Other:  No intraperitoneal free fluid. Musculoskeletal: No abnormal marrow enhancement within the visualized bony anatomy. IMPRESSION: 1. Cholelithiasis. Extrahepatic common duct measures upper normal for patient age.  There is a tiny 4 mm stone in the distal common bile duct. 2. 1.1 cm well-defined mildly heterogeneous lesion in the upper spleen. This cannot be completely characterized due to substantial motion degradation on the postcontrast imaging. Features may be related to a complex cyst/pseudocyst, potentially with rim calcification. Electronically Signed   By: Misty Stanley M.D.   On: 03/06/2019 18:32   Dg Chest Portable 1 View  Result Date: 03/06/2019 CLINICAL DATA:  Chest pain. EXAM: PORTABLE CHEST 1 VIEW COMPARISON:  None. FINDINGS: The heart size and mediastinal contours are within normal limits. Both lungs are clear. No pneumothorax or pleural effusion is noted. The visualized skeletal structures are unremarkable. IMPRESSION: No active disease. Electronically Signed   By: Marijo Conception M.D.   On: 03/06/2019 11:43   Mr Abdomen Mrcp Moise Boring Contast  Result Date: 03/06/2019 CLINICAL DATA:  Cholelithiasis. EXAM: MRI ABDOMEN WITHOUT AND WITH CONTRAST (INCLUDING MRCP) TECHNIQUE: Multiplanar multisequence MR imaging of the abdomen was performed both before and after the administration of intravenous contrast. Heavily T2-weighted images of the biliary and pancreatic ducts were obtained, and three-dimensional MRCP images were rendered by post processing. CONTRAST:  10 cc Gadavist COMPARISON:  Ultrasound exam earlier today. FINDINGS: Lower chest: Large hiatal hernia. Hepatobiliary: Mild diffuse fatty deposition noted within the liver parenchyma. No focal hepatic mass lesion. Differential perfusion of the liver noted on arterial phase imaging. Portal vein and hepatic veins are patent. Tiny layering gallstones are evident. Common duct measures 7 mm  diameter. Common bile duct in the head of the pancreas measures 4 mm diameter. There is a tiny stone visible in the distal common bile duct on MRCP imaging (see image 36 of series 6 and image 2 of series 9. Pancreas: No gross abnormality although fine detail obscured by motion artifact. No dilatation of the main pancreatic duct. Spleen: 1.1 cm T1 hyperintense, T2 hypointense lesion is identified in the upper spleen, well-marginated and slightly heterogeneous with a margin that is nearly signal void. Contrast enhancement characteristics of this lesion cannot be determined due to substantial motion degradation. Adrenals/Urinary Tract: No adrenal nodule or mass. No hydronephrosis in either kidney. Tiny subcapsular cyst noted upper pole right kidney. Stomach/Bowel: Large hiatal hernia. No small bowel or colonic dilatation within the visualized abdomen Vascular/Lymphatic: No abdominal aortic aneurysm. No abdominal lymphadenopathy. Other:  No intraperitoneal free fluid. Musculoskeletal: No abnormal marrow enhancement within the visualized bony anatomy. IMPRESSION: 1. Cholelithiasis. Extrahepatic common duct measures upper normal for patient age. There is a tiny 4 mm stone in the distal common bile duct. 2. 1.1 cm well-defined mildly heterogeneous lesion in the upper spleen. This cannot be completely characterized due to substantial motion degradation on the postcontrast imaging. Features may be related to a complex cyst/pseudocyst, potentially with rim calcification. Electronically Signed   By: Misty Stanley M.D.   On: 03/06/2019 18:32   US Abdomen Limited Ruq  Result Date: 03/06/2019 CLINICAL DATA:  Right upper quadrant pain. EXAM: ULTRASOUND ABDOMEN LIMITED RIGHT UPPER QUADRANT COMPARISON:  None. FINDINGS: Gallbladder: Cholelithiasis is identified. The gallbladder wall thickness measures 3.3 mm. No Murphy's sign or pericholecystic fluid identified. Common bile duct: Diameter: 6.6 mm Liver: Increased echogenicity  with no focal mass. Portal vein is patent on color Doppler imaging with normal direction of blood flow towards the liver. IMPRESSION: 1. The common bile duct measures 6.6 mm. 6 mm is the upper limits of normal. Recommend correlation with LFTs. If there is concern for biliary obstruction, recommend an MRCP.  2. Several small stones are seen in the gallbladder. The gallbladder wall thickness of 3.3 mm is borderline. 3 mm is considered the upper limits of normal. No pericholecystic fluid or Murphy's sign. Recommend attention to the gallbladder if the patient receives an MRCP. Otherwise, if the patient does not have an MRCP and there is concern for acute cholecystitis, recommend HIDA scan. 3. Increased echogenicity in the liver is nonspecific but often seen with hepatic steatosis. Electronically Signed   By: Dorise Bullion III M.D   On: 03/06/2019 13:59    Impression: Symptomatic cholelithiasis and tiny 4 mm stone in distal common bile duct LFTs trending down Bili 2.7/AST 335/ALT 366/ALP 282 today SARS Coronavirus 2 NEGATIVE NEGATIVE     Plan: ERCP in a.m. tomorrow with Dr. Carlean Purl. The risks(bleeding, infection, perforation, pancreatitis, anesthesia complications and even death) and the benefits(removal of CBD stone) were discussed with the patient in detail. She understands and verbalizes consent. Will be started on clear liquid diet and kept n.p.o. post midnight. She is on aspirin 81 mg, last dose today and has a large hiatal hernia noted on MRI. Surgical consultation for cholecystectomy.   LOS: 1 day   Ronnette Juniper, MD  03/07/2019, 10:56 AM  Pager [phone] If no answer or after 5 PM call [phone]

## 2019-03-07 NOTE — Consult Note (Addendum)
[facility] Surgery Consult Note  Kendra Miles 1951-06-11  [id number].    Requesting MD: Alfredia Ferguson Chief Complaint/Reason for Consult: choledocholithiasis HPI:   Patient is a 68 year old female who presented to [facility] yesterday with abdominal and chest pain. Pain woke her up around 2AM yesterday and was in the epigastric region radiating to chest and back. Pain described as sharp, 9/10 in severity. Patient saw PCP and was referred to ED for EKG changes.   In the ED patient's pain had improved. Denied fever, chills, nausea, vomiting, diarrhea, SOB, cough. Patient reports that she has had intermittent abdominal pain with nausea and vomiting after eating for the past 2 years. This probably happens about 1-2 times per month by her estimation. SHe thought symptoms were just related to reflux.   PMH significant for HTN, HLD, T2DM, GERD, hiatal hernia. Patient not on any blood thinning medications. Past abdominal surgeries include laparoscopic abdominal hysterectomy and laparoscopic appendectomy. Denies alcohol, tobacco or illicit drug use. Works as a Animator currently.  ROS: Review of Systems  Constitutional: Negative for chills and fever.  Respiratory: Negative for cough and shortness of breath.   Cardiovascular: Positive for chest pain. Negative for palpitations.  Gastrointestinal: Positive for abdominal pain. Negative for blood in stool, constipation, diarrhea, melena, nausea and vomiting.  Genitourinary: Negative for dysuria, frequency and urgency.  Musculoskeletal: Positive for back pain.  All other systems reviewed and are negative.   Family History  Problem Relation Age of Onset  . COPD Father     Past Medical History:  Diagnosis Date  . Acid reflux     Past Surgical History:  Procedure Laterality Date  . ABDOMINAL HYSTERECTOMY     partial  . APPENDECTOMY    . CYST REMOVAL TRUNK      Social History:  reports that she has never smoked. She has never used  smokeless tobacco. She reports that she does not drink alcohol or use drugs.  Allergies: No Known Allergies  Medications Prior to Admission  Medication Sig Dispense Refill  . aspirin 81 MG tablet Take 81 mg by mouth daily.    . cholecalciferol (VITAMIN D3) 25 MCG (1000 UT) tablet Take 1,000 Units by mouth daily.    Marland Kitchen glipiZIDE (GLUCOTROL) 10 MG tablet Take 10 mg by mouth daily.    Marland Kitchen lisinopril (ZESTRIL) 2.5 MG tablet Take 2.5 mg by mouth daily.    . Multiple Vitamin (MULTIVITAMIN WITH MINERALS) TABS tablet Take 1 tablet by mouth daily.    . Omega-3 Fatty Acids (FISH OIL) 1000 MG CAPS Take 1,000 mg by mouth daily.    Marland Kitchen omeprazole (PRILOSEC) 20 MG capsule Take 20 mg by mouth daily.    . rosuvastatin (CRESTOR) 10 MG tablet     . amoxicillin-clavulanate (AUGMENTIN) 875-125 MG per tablet Take 1 tablet by mouth 2 (two) times daily. (Patient not taking: Reported on 03/06/2019) 13 tablet 0    Blood pressure 133/84, pulse 88, temperature 98.9 F (37.2 C), temperature source Oral, resp. rate 17, height 5\' 6"  (1.676 m), weight 92.2 kg, SpO2 91 %. Physical Exam: Physical Exam Constitutional:      Appearance: Normal appearance. She is well-developed. She is obese. She is not toxic-appearing.  HENT:     Head: Normocephalic and atraumatic.     Right Ear: External ear normal.     Left Ear: External ear normal.     Nose: Nose normal.     Mouth/Throat:     Lips: Pink.  Mouth: Mucous membranes are moist.  Eyes:     General: Lids are normal. No scleral icterus.    Extraocular Movements: Extraocular movements intact.     Conjunctiva/sclera: Conjunctivae normal.  Neck:     Musculoskeletal: Normal range of motion and neck supple.  Cardiovascular:     Rate and Rhythm: Normal rate and regular rhythm.     Pulses:          Radial pulses are 2+ on the right side and 2+ on the left side.       Dorsalis pedis pulses are 2+ on the right side and 2+ on the left side.     Heart sounds: No murmur. No  friction rub. No gallop.   Pulmonary:     Effort: Pulmonary effort is normal.     Breath sounds: Normal breath sounds.  Abdominal:     General: Bowel sounds are normal. There is no distension.     Palpations: Abdomen is soft.     Tenderness: There is no abdominal tenderness. There is no guarding or rebound. Negative signs include Murphy's sign.     Hernia: No hernia is present.  Musculoskeletal:     Right lower leg: No edema.     Left lower leg: No edema.     Comments: ROM grossly intact in bilateral upper and lower extremities  Skin:    General: Skin is warm and dry.     Coloration: Skin is not jaundiced.  Psychiatric:        Attention and Perception: Attention and perception normal.        Mood and Affect: Mood and affect normal.        Speech: Speech normal.        Behavior: Behavior normal. Behavior is cooperative.     Results for orders placed or performed during the hospital encounter of 03/06/19 (from the past 48 hour(s))  Comprehensive metabolic panel     Status: Abnormal   Collection Time: 03/06/19 11:05 AM  Result Value Ref Range   Sodium 142 135 - 145 mmol/L   Potassium 3.9 3.5 - 5.1 mmol/L   Chloride 104 98 - 111 mmol/L   CO2 28 22 - 32 mmol/L   Glucose, Bld 123 (H) 70 - 99 mg/dL   BUN 13 8 - 23 mg/dL   Creatinine, Ser 0.75 0.44 - 1.00 mg/dL   Calcium 9.3 8.9 - 10.3 mg/dL   Total Protein 6.7 6.5 - 8.1 g/dL   Albumin 4.0 3.5 - 5.0 g/dL   AST 662 (H) 15 - 41 U/L   ALT 457 (H) 0 - 44 U/L   Alkaline Phosphatase 215 (H) 38 - 126 U/L   Total Bilirubin 1.6 (H) 0.3 - 1.2 mg/dL   GFR calc non Af Amer >60 >60 mL/min   GFR calc Af Amer >60 >60 mL/min   Anion gap 10 5 - 15    Comment: Performed at [facility], [place] [address] [place], [place] [address]  Lipase, blood     Status: None   Collection Time: 03/06/19 11:05 AM  Result Value Ref Range   Lipase 32 11 - 51 U/L    Comment: Performed at [facility], [place] [address],  [place], [place] [address]  Troponin I (High Sensitivity)     Status: None   Collection Time: 03/06/19 11:05 AM  Result Value Ref Range   Troponin I (High Sensitivity) 11 <18 ng/L    Comment: (NOTE) Elevated high  sensitivity troponin I (hsTnI) values and significant  changes across serial measurements may suggest ACS but many other  chronic and acute conditions are known to elevate hsTnI results.  Refer to the "Links" section for chest pain algorithms and additional  guidance. Performed at [facility], [place] [address], [place], [place] [address]   CBC with Differential     Status: None   Collection Time: 03/06/19 11:05 AM  Result Value Ref Range   WBC 8.6 4.0 - 10.5 K/uL   RBC 4.91 3.87 - 5.11 MIL/uL   Hemoglobin 14.9 12.0 - 15.0 g/dL   HCT 44.9 36.0 - 46.0 %   MCV 91.4 80.0 - 100.0 fL   MCH 30.3 26.0 - 34.0 pg   MCHC 33.2 30.0 - 36.0 g/dL   RDW 13.5 11.5 - 15.5 %   Platelets 271 150 - 400 K/uL   nRBC 0.0 0.0 - 0.2 %   Neutrophils Relative % 77 %   Neutro Abs 6.5 1.7 - 7.7 K/uL   Lymphocytes Relative 15 %   Lymphs Abs 1.3 0.7 - 4.0 K/uL   Monocytes Relative 7 %   Monocytes Absolute 0.6 0.1 - 1.0 K/uL   Eosinophils Relative 1 %   Eosinophils Absolute 0.1 0.0 - 0.5 K/uL   Basophils Relative 0 %   Basophils Absolute 0.0 0.0 - 0.1 K/uL   Immature Granulocytes 0 %   Abs Immature Granulocytes 0.03 0.00 - 0.07 K/uL    Comment: Performed at [facility], [place] [address] [place], [place] [address]  Acetaminophen level     Status: Abnormal   Collection Time: 03/06/19 11:05 AM  Result Value Ref Range   Acetaminophen (Tylenol), Serum <10 (L) 10 - 30 ug/mL    Comment: (NOTE) Therapeutic concentrations vary significantly. A range of 10-30 ug/mL  may be an effective concentration for many patients. However, some  are best treated at concentrations outside of this range. Acetaminophen concentrations >150 ug/mL at 4 hours after ingestion  and >50 ug/mL  at 12 hours after ingestion are often associated with  toxic reactions. Performed at [facility], [place] [address] [place], [place] [address]   Protime-INR     Status: None   Collection Time: 03/06/19 11:05 AM  Result Value Ref Range   Prothrombin Time 12.7 11.4 - 15.2 seconds   INR 1.0 0.8 - 1.2    Comment: (NOTE) INR goal varies based on device and disease states. Performed at [facility], [place] [address], [place], [place] [address]   Hepatitis panel, acute     Status: None   Collection Time: 03/06/19 11:05 AM  Result Value Ref Range   Hepatitis B Surface Ag Negative Negative   HCV Ab <0.1 0.0 - 0.9 s/co ratio    Comment: (NOTE)                                  Negative:     < 0.8                             Indeterminate: 0.8 - 0.9                                  Positive:     > 0.9 The CDC recommends that  a positive HCV antibody result be followed up with a HCV Nucleic Acid Amplification test ([id number]). Performed At: [facility] [place], [place] [id number] Rush Farmer MD TI:[id number]    Hep A IgM Negative Negative   Hep B C IgM Negative Negative  SARS Coronavirus 2 (CEPHEID - Performed in [place] hospital lab), Hosp Order     Status: None   Collection Time: 03/06/19  2:36 PM   Specimen: Nasopharyngeal Swab  Result Value Ref Range   SARS Coronavirus 2 NEGATIVE NEGATIVE    Comment: (NOTE) If result is NEGATIVE SARS-CoV-2 target nucleic acids are NOT DETECTED. The SARS-CoV-2 RNA is generally detectable in upper and lower  respiratory specimens during the acute phase of infection. The lowest  concentration of SARS-CoV-2 viral copies this assay can detect is 250  copies / mL. A negative result does not preclude SARS-CoV-2 infection  and should not be used as the sole basis for treatment or other  patient management decisions.  A negative result may occur with  improper specimen collection / handling,  submission of specimen other  than nasopharyngeal swab, presence of viral mutation(s) within the  areas targeted by this assay, and inadequate number of viral copies  (<250 copies / mL). A negative result must be combined with clinical  observations, patient history, and epidemiological information. If result is POSITIVE SARS-CoV-2 target nucleic acids are DETECTED. The SARS-CoV-2 RNA is generally detectable in upper and lower  respiratory specimens dur ing the acute phase of infection.  Positive  results are indicative of active infection with SARS-CoV-2.  Clinical  correlation with patient history and other diagnostic information is  necessary to determine patient infection status.  Positive results do  not rule out bacterial infection or co-infection with other viruses. If result is PRESUMPTIVE POSTIVE SARS-CoV-2 nucleic acids MAY BE PRESENT.   A presumptive positive result was obtained on the submitted specimen  and confirmed on repeat testing.  While 2019 novel coronavirus  (SARS-CoV-2) nucleic acids may be present in the submitted sample  additional confirmatory testing may be necessary for epidemiological  and / or clinical management purposes  to differentiate between  SARS-CoV-2 and other Sarbecovirus currently known to infect humans.  If clinically indicated additional testing with an alternate test  methodology [phone]) is advised. The SARS-CoV-2 RNA is generally  detectable in upper and lower respiratory sp ecimens during the acute  phase of infection. The expected result is Negative. Fact Sheet for Patients:  StrictlyIdeas.no Fact Sheet for Healthcare Providers: BankingDealers.co.za This test is not yet approved or cleared by the [place] FDA and has been authorized for detection and/or diagnosis of SARS-CoV-2 by FDA under an Emergency Use Authorization (EUA).  This EUA will remain in effect (meaning this test can be  used) for the duration of the COVID-19 declaration under Section 564(b)(1) of the Act, 21 U.S.C. section 360bbb-3(b)(1), unless the authorization is terminated or revoked sooner. Performed at [facility], [place] [address], [place], [place] [address]   Troponin I (High Sensitivity)     Status: None   Collection Time: 03/06/19  2:47 PM  Result Value Ref Range   Troponin I (High Sensitivity) 10.0 <18 ng/L    Comment: (NOTE) Elevated high sensitivity troponin I (hsTnI) values and significant  changes across serial measurements may suggest ACS but many other  chronic and acute conditions are known to elevate hsTnI results.  Refer to the "Links" section for chest pain algorithms and additional  guidance. Performed at [facility], [place] [address], [place], [place] [address]   CBG monitoring, ED     Status: None   Collection Time: 03/06/19  4:39 PM  Result Value Ref Range   Glucose-Capillary 79 70 - 99 mg/dL  Hemoglobin A1c     Status: Abnormal   Collection Time: 03/07/19  5:58 AM  Result Value Ref Range   Hgb A1c MFr Bld 7.5 (H) 4.8 - 5.6 %    Comment: (NOTE) Pre diabetes:          5.7%-6.4% Diabetes:              >6.4% Glycemic control for   <7.0% adults with diabetes    Mean Plasma Glucose 168.55 mg/dL    Comment: Performed at [place] [address], [place], [place] [address]  Comprehensive metabolic panel     Status: Abnormal   Collection Time: 03/07/19  5:58 AM  Result Value Ref Range   Sodium 141 135 - 145 mmol/L   Potassium 3.9 3.5 - 5.1 mmol/L   Chloride 105 98 - 111 mmol/L   CO2 27 22 - 32 mmol/L   Glucose, Bld 165 (H) 70 - 99 mg/dL   BUN 11 8 - 23 mg/dL   Creatinine, Ser 0.71 0.44 - 1.00 mg/dL   Calcium 8.7 (L) 8.9 - 10.3 mg/dL   Total Protein 6.2 (L) 6.5 - 8.1 g/dL   Albumin 3.6 3.5 - 5.0 g/dL   AST 335 (H) 15 - 41 U/L   ALT 366 (H) 0 - 44 U/L   Alkaline Phosphatase 282 (H) 38 - 126 U/L   Total Bilirubin 2.7 (H)  0.3 - 1.2 mg/dL   GFR calc non Af Amer >60 >60 mL/min   GFR calc Af Amer >60 >60 mL/min   Anion gap 9 5 - 15    Comment: Performed at [facility], [place] [address], [place], [place] [address]  CBC     Status: Abnormal   Collection Time: 03/07/19  5:58 AM  Result Value Ref Range   WBC 9.4 4.0 - 10.5 K/uL   RBC 4.78 3.87 - 5.11 MIL/uL   Hemoglobin 14.6 12.0 - 15.0 g/dL   HCT 44.1 36.0 - 46.0 %   MCV 92.3 80.0 - 100.0 fL   MCH 30.5 26.0 - 34.0 pg   MCHC 33.1 30.0 - 36.0 g/dL   RDW 13.5 11.5 - 15.5 %   Platelets 310 150 - 400 K/uL   nRBC 0.9 (H) 0.0 - 0.2 %    Comment: Performed at [facility], [place] [address] [place], [place] [address]  Glucose, capillary     Status: Abnormal   Collection Time: 03/07/19  7:10 AM  Result Value Ref Range   Glucose-Capillary 154 (H) 70 - 99 mg/dL   Comment 1 Notify RN    Comment 2 Document in Chart   Glucose, capillary     Status: None   Collection Time: 03/07/19 11:09 AM  Result Value Ref Range   Glucose-Capillary 88 70 - 99 mg/dL   Comment 1 Notify RN    Comment 2 Document in Chart    Mr 3d Recon At Scanner  Result Date: 03/06/2019 CLINICAL DATA:  Cholelithiasis. EXAM: MRI ABDOMEN WITHOUT AND WITH CONTRAST (INCLUDING MRCP) TECHNIQUE: Multiplanar multisequence MR imaging of the abdomen was performed both before and after the administration of intravenous contrast. Heavily T2-weighted images of the biliary and pancreatic ducts were  obtained, and three-dimensional MRCP images were rendered by post processing. CONTRAST:  10 cc Gadavist COMPARISON:  Ultrasound exam earlier today. FINDINGS: Lower chest: Large hiatal hernia. Hepatobiliary: Mild diffuse fatty deposition noted within the liver parenchyma. No focal hepatic mass lesion. Differential perfusion of the liver noted on arterial phase imaging. Portal vein and hepatic veins are patent. Tiny layering gallstones are evident. Common duct measures 7 mm diameter. Common  bile duct in the head of the pancreas measures 4 mm diameter. There is a tiny stone visible in the distal common bile duct on MRCP imaging (see image 36 of series 6 and image 2 of series 9. Pancreas: No gross abnormality although fine detail obscured by motion artifact. No dilatation of the main pancreatic duct. Spleen: 1.1 cm T1 hyperintense, T2 hypointense lesion is identified in the upper spleen, well-marginated and slightly heterogeneous with a margin that is nearly signal void. Contrast enhancement characteristics of this lesion cannot be determined due to substantial motion degradation. Adrenals/Urinary Tract: No adrenal nodule or mass. No hydronephrosis in either kidney. Tiny subcapsular cyst noted upper pole right kidney. Stomach/Bowel: Large hiatal hernia. No small bowel or colonic dilatation within the visualized abdomen Vascular/Lymphatic: No abdominal aortic aneurysm. No abdominal lymphadenopathy. Other:  No intraperitoneal free fluid. Musculoskeletal: No abnormal marrow enhancement within the visualized bony anatomy. IMPRESSION: 1. Cholelithiasis. Extrahepatic common duct measures upper normal for patient age. There is a tiny 4 mm stone in the distal common bile duct. 2. 1.1 cm well-defined mildly heterogeneous lesion in the upper spleen. This cannot be completely characterized due to substantial motion degradation on the postcontrast imaging. Features may be related to a complex cyst/pseudocyst, potentially with rim calcification. Electronically Signed   By: Misty Stanley M.D.   On: 03/06/2019 18:32   Dg Chest Portable 1 View  Result Date: 03/06/2019 CLINICAL DATA:  Chest pain. EXAM: PORTABLE CHEST 1 VIEW COMPARISON:  None. FINDINGS: The heart size and mediastinal contours are within normal limits. Both lungs are clear. No pneumothorax or pleural effusion is noted. The visualized skeletal structures are unremarkable. IMPRESSION: No active disease. Electronically Signed   By: Marijo Conception M.D.    On: 03/06/2019 11:43   Mr Abdomen Mrcp Moise Boring Contast  Result Date: 03/06/2019 CLINICAL DATA:  Cholelithiasis. EXAM: MRI ABDOMEN WITHOUT AND WITH CONTRAST (INCLUDING MRCP) TECHNIQUE: Multiplanar multisequence MR imaging of the abdomen was performed both before and after the administration of intravenous contrast. Heavily T2-weighted images of the biliary and pancreatic ducts were obtained, and three-dimensional MRCP images were rendered by post processing. CONTRAST:  10 cc Gadavist COMPARISON:  Ultrasound exam earlier today. FINDINGS: Lower chest: Large hiatal hernia. Hepatobiliary: Mild diffuse fatty deposition noted within the liver parenchyma. No focal hepatic mass lesion. Differential perfusion of the liver noted on arterial phase imaging. Portal vein and hepatic veins are patent. Tiny layering gallstones are evident. Common duct measures 7 mm diameter. Common bile duct in the head of the pancreas measures 4 mm diameter. There is a tiny stone visible in the distal common bile duct on MRCP imaging (see image 36 of series 6 and image 2 of series 9. Pancreas: No gross abnormality although fine detail obscured by motion artifact. No dilatation of the main pancreatic duct. Spleen: 1.1 cm T1 hyperintense, T2 hypointense lesion is identified in the upper spleen, well-marginated and slightly heterogeneous with a margin that is nearly signal void. Contrast enhancement characteristics of this lesion cannot be determined due to substantial motion degradation. Adrenals/Urinary Tract: No  adrenal nodule or mass. No hydronephrosis in either kidney. Tiny subcapsular cyst noted upper pole right kidney. Stomach/Bowel: Large hiatal hernia. No small bowel or colonic dilatation within the visualized abdomen Vascular/Lymphatic: No abdominal aortic aneurysm. No abdominal lymphadenopathy. Other:  No intraperitoneal free fluid. Musculoskeletal: No abnormal marrow enhancement within the visualized bony anatomy. IMPRESSION: 1.  Cholelithiasis. Extrahepatic common duct measures upper normal for patient age. There is a tiny 4 mm stone in the distal common bile duct. 2. 1.1 cm well-defined mildly heterogeneous lesion in the upper spleen. This cannot be completely characterized due to substantial motion degradation on the postcontrast imaging. Features may be related to a complex cyst/pseudocyst, potentially with rim calcification. Electronically Signed   By: Misty Stanley M.D.   On: 03/06/2019 18:32   US Abdomen Limited Ruq  Result Date: 03/06/2019 CLINICAL DATA:  Right upper quadrant pain. EXAM: ULTRASOUND ABDOMEN LIMITED RIGHT UPPER QUADRANT COMPARISON:  None. FINDINGS: Gallbladder: Cholelithiasis is identified. The gallbladder wall thickness measures 3.3 mm. No Murphy's sign or pericholecystic fluid identified. Common bile duct: Diameter: 6.6 mm Liver: Increased echogenicity with no focal mass. Portal vein is patent on color Doppler imaging with normal direction of blood flow towards the liver. IMPRESSION: 1. The common bile duct measures 6.6 mm. 6 mm is the upper limits of normal. Recommend correlation with LFTs. If there is concern for biliary obstruction, recommend an MRCP. 2. Several small stones are seen in the gallbladder. The gallbladder wall thickness of 3.3 mm is borderline. 3 mm is considered the upper limits of normal. No pericholecystic fluid or Murphy's sign. Recommend attention to the gallbladder if the patient receives an MRCP. Otherwise, if the patient does not have an MRCP and there is concern for acute cholecystitis, recommend HIDA scan. 3. Increased echogenicity in the liver is nonspecific but often seen with hepatic steatosis. Electronically Signed   By: Dorise Bullion III M.D   On: 03/06/2019 13:59   Assessment/Plan HTN HLD T2DM Hiatal hernia GERD  Choledocholithiasis - MRCP with stone in CBD, noted plan for ERCP tomorrow  - LFTs and Tbili trending up from admission, continue to trend - plan for lap  chole possibly Sunday, pending ERCP results  FEN: CLD, NPO after MN VTE: SCDs, lovenox ID: no current abx POC: Antony Odea (daughter in-law) [place], [facility] 03/07/2019, 11:46 AM Pager: [phone] Consults: [phone]  Agree with above. Surgery timing will depend on ERCP tomorrow. Will follow.  Her husband died in 21-Sep-2022 of this year.   I spoke to daughter in law, Antony Odea.  Alphonsa Overall, MD, [facility] Surgery Pager: [phone] Office phone:  [phone]

## 2019-03-07 NOTE — H&P (View-Only) (Signed)
[facility] Consult  Referring Provider: Alma Friendly, MD Primary Care Physician:  Simona Huh, NP Primary Gastroenterologist: Althia Forts  Reason for Consultation:  CBD stone  HPI: Kendra Miles is a 68 y.o. female presented to the ER yesterday with complains of severe lower sternal pain with radiation to the back which woke her up at 2 AM and was associated with nausea and vomiting.  Patient states that for the last 2 years she has had intermittent episodes of such pain which she believed was related to acid reflux and takes omeprazole over-the-counter daily.  She has not had any fevers, difficulty swallowing, pain on swallowing, change in bowel habits, blood in stool or black stools, unintentional weight loss. She reports having a colonoscopy several years ago at [facility] and was due for a repeat colonoscopy in January of this year. She recalls having an EGD several years ago and being told that she has a hiatal hernia. In the ER she was found to have elevated liver enzymes, AST 662, ALT 457, ALP 215 and T bili 1.6 without associated leukocytosis and normal lipase. She subsequently had an MRI/MRCP which showed tiny layering gallstones, CBD of 7 mm, CBD stone of 4 mm, normal pancreas, large hiatal hernia. Ultrasound had shown a CBD of 6.6 mm, gallbladder wall thickness of 3.3 mm and hepatic steatosis.   Past Medical History:  Diagnosis Date  . Acid reflux     Past Surgical History:  Procedure Laterality Date  . ABDOMINAL HYSTERECTOMY     partial  . APPENDECTOMY    . CYST REMOVAL TRUNK      Prior to Admission medications   Medication Sig Start Date End Date Taking? Authorizing Provider  aspirin 81 MG tablet Take 81 mg by mouth daily.   Yes [provider]  cholecalciferol (VITAMIN D3) 25 MCG (1000 UT) tablet Take 1,000 Units by mouth daily.   Yes [provider]  glipiZIDE (GLUCOTROL) 10 MG tablet Take 10 mg by mouth daily. 12/20/18  Yes  [provider]  lisinopril (ZESTRIL) 2.5 MG tablet Take 2.5 mg by mouth daily. 02/15/19  Yes [provider]  Multiple Vitamin (MULTIVITAMIN WITH MINERALS) TABS tablet Take 1 tablet by mouth daily.   Yes [provider]  Omega-3 Fatty Acids (FISH OIL) 1000 MG CAPS Take 1,000 mg by mouth daily.   Yes [provider]  omeprazole (PRILOSEC) 20 MG capsule Take 20 mg by mouth daily.   Yes [provider]  rosuvastatin (CRESTOR) 10 MG tablet  03/03/19  Yes [provider]  amoxicillin-clavulanate (AUGMENTIN) 875-125 MG per tablet Take 1 tablet by mouth 2 (two) times daily. Patient not taking: Reported on 03/06/2019 09/19/13   Schinlever, Barnetta Chapel, PA-C    Current Facility-Administered Medications  Medication Dose Route Frequency Provider Last Rate Last Dose  . aspirin EC tablet 81 mg  81 mg Oral Daily Alma Friendly, MD      . enoxaparin (LOVENOX) injection 40 mg  40 mg Subcutaneous Q24H Alma Friendly, MD   40 mg at 03/06/19 2116  . insulin aspart (novoLOG) injection 0-9 Units  0-9 Units Subcutaneous TID WC Alma Friendly, MD   2 Units at 03/07/19 [phone]  . lisinopril (ZESTRIL) tablet 2.5 mg  2.5 mg Oral Daily Alma Friendly, MD      . morphine 2 MG/ML injection 2 mg  2 mg Intravenous Q4H PRN Alma Friendly, MD      . ondansetron [facility]) tablet 4 mg  4 mg Oral Q6H PRN Alma Friendly, MD       Or  . ondansetron [facility]) injection 4 mg  4 mg Intravenous Q6H PRN Alma Friendly, MD      . pantoprazole (PROTONIX) EC tablet 40 mg  40 mg Oral Daily Alma Friendly, MD      . polyethylene glycol (MIRALAX / GLYCOLAX) packet 17 g  17 g Oral Daily PRN Alma Friendly, MD      . rosuvastatin (CRESTOR) tablet 10 mg  10 mg Oral Daily Alma Friendly, MD      . traMADol [facility]at[facility]) tablet 50 mg  50 mg Oral Q6H PRN Alma Friendly, MD        Allergies as of 03/06/2019  . (No Known Allergies)     Family History  Problem Relation Age of Onset  . COPD Father     Social History   Socioeconomic History  . Marital status: Divorced    Spouse name: Not on file  . Number of children: Not on file  . Years of education: Not on file  . Highest education level: Not on file  Occupational History  . Not on file  Social Needs  . Financial resource strain: Not on file  . Food insecurity    Worry: Not on file    Inability: Not on file  . Transportation needs    Medical: Not on file    Non-medical: Not on file  Tobacco Use  . Smoking status: Never Smoker  . Smokeless tobacco: Never Used  Substance and Sexual Activity  . Alcohol use: No  . Drug use: Never  . Sexual activity: Not on file  Lifestyle  . Physical activity    Days per week: Not on file    Minutes per session: Not on file  . Stress: Not on file  Relationships  . Social Herbalist on phone: Not on file    Gets together: Not on file    Attends religious service: Not on file    Active member of club or organization: Not on file    Attends meetings of clubs or organizations: Not on file    Relationship status: Not on file  . Intimate partner violence    Fear of current or ex partner: Not on file    Emotionally abused: Not on file    Physically abused: Not on file    Forced sexual activity: Not on file  Other Topics Concern  . Not on file  Social History Narrative  . Not on file    Review of Systems: GI: Described in detail in HPI.    Gen: Denies any fever, chills, rigors, night sweats, anorexia, fatigue, weakness, malaise, involuntary weight loss, and sleep disorder CV: Denies chest pain, angina, palpitations, syncope, orthopnea, PND, peripheral edema, and claudication. Resp: Denies dyspnea, cough, sputum, wheezing, coughing up blood. GU : Denies urinary burning, blood in urine, urinary frequency, urinary hesitancy, nocturnal urination, and urinary incontinence. MS: Denies joint pain or  swelling.  Denies muscle weakness, cramps, atrophy.  Derm: Denies rash, itching, oral ulcerations, hives, unhealing ulcers.  Psych: Denies depression, anxiety, memory loss, suicidal ideation, hallucinations,  and confusion. Heme: Denies bruising, bleeding, and enlarged lymph nodes. Neuro:  Denies any headaches, dizziness, paresthesias. Endo:  Denies any problems with DM, thyroid, adrenal function.  Physical Exam: Vital signs in last 24 hours: Temp:  [98.2 F (36.8 C)-98.9 F (37.2 C)] 98.9 F (37.2 C) (07/17  0540) Pulse Rate:  [55-90] 88 (07/17 0540) Resp:  [17-24] 17 (07/17 0540) BP: (131-170)/(75-97) 133/84 (07/17 0540) SpO2:  [91 %-98 %] 91 % (07/17 0540) Weight:  [92.2 kg] 92.2 kg (07/16 1843) Last BM Date: 03/06/19  General:   Alert,  Well-developed, well-nourished, pleasant and cooperative in NAD Head:  Normocephalic and atraumatic. Eyes:  Sclera clear, mild icterus.   Conjunctiva pink. Ears:  Normal auditory acuity. Nose:  No deformity, discharge,  or lesions. Mouth:  No deformity or lesions.  Oropharynx pink & moist. Neck:  Supple; no masses or thyromegaly. Lungs:  Clear throughout to auscultation.   No wheezes, crackles, or rhonchi. No acute distress. Heart:  Regular rate and rhythm; no murmurs, clicks, rubs,  or gallops. Extremities:  Without clubbing or edema. Neurologic:  Alert and  oriented x4;  grossly normal neurologically. Skin:  Intact without significant lesions or rashes. Psych:  Alert and cooperative. Normal mood and affect. Abdomen:  Soft, nontender and nondistended. No masses, hepatosplenomegaly or hernias noted. Normal bowel sounds, without guarding, and without rebound.         Lab Results: Recent Labs    03/06/19 1105 03/07/19 0558  WBC 8.6 9.4  HGB 14.9 14.6  HCT 44.9 44.1  PLT 271 310   BMET Recent Labs    03/06/19 1105 03/07/19 0558  NA 142 141  K 3.9 3.9  CL 104 105  CO2 28 27  GLUCOSE 123* 165*  BUN 13 11  CREATININE 0.75 0.71   CALCIUM 9.3 8.7*   LFT Recent Labs    03/07/19 0558  PROT 6.2*  ALBUMIN 3.6  AST 335*  ALT 366*  ALKPHOS 282*  BILITOT 2.7*   PT/INR Recent Labs    03/06/19 1105  LABPROT 12.7  INR 1.0    Studies/Results: Mr 3d Recon At Scanner  Result Date: 03/06/2019 CLINICAL DATA:  Cholelithiasis. EXAM: MRI ABDOMEN WITHOUT AND WITH CONTRAST (INCLUDING MRCP) TECHNIQUE: Multiplanar multisequence MR imaging of the abdomen was performed both before and after the administration of intravenous contrast. Heavily T2-weighted images of the biliary and pancreatic ducts were obtained, and three-dimensional MRCP images were rendered by post processing. CONTRAST:  10 cc Gadavist COMPARISON:  Ultrasound exam earlier today. FINDINGS: Lower chest: Large hiatal hernia. Hepatobiliary: Mild diffuse fatty deposition noted within the liver parenchyma. No focal hepatic mass lesion. Differential perfusion of the liver noted on arterial phase imaging. Portal vein and hepatic veins are patent. Tiny layering gallstones are evident. Common duct measures 7 mm diameter. Common bile duct in the head of the pancreas measures 4 mm diameter. There is a tiny stone visible in the distal common bile duct on MRCP imaging (see image 36 of series 6 and image 2 of series 9. Pancreas: No gross abnormality although fine detail obscured by motion artifact. No dilatation of the main pancreatic duct. Spleen: 1.1 cm T1 hyperintense, T2 hypointense lesion is identified in the upper spleen, well-marginated and slightly heterogeneous with a margin that is nearly signal void. Contrast enhancement characteristics of this lesion cannot be determined due to substantial motion degradation. Adrenals/Urinary Tract: No adrenal nodule or mass. No hydronephrosis in either kidney. Tiny subcapsular cyst noted upper pole right kidney. Stomach/Bowel: Large hiatal hernia. No small bowel or colonic dilatation within the visualized abdomen Vascular/Lymphatic: No  abdominal aortic aneurysm. No abdominal lymphadenopathy. Other:  No intraperitoneal free fluid. Musculoskeletal: No abnormal marrow enhancement within the visualized bony anatomy. IMPRESSION: 1. Cholelithiasis. Extrahepatic common duct measures upper normal for patient age.  There is a tiny 4 mm stone in the distal common bile duct. 2. 1.1 cm well-defined mildly heterogeneous lesion in the upper spleen. This cannot be completely characterized due to substantial motion degradation on the postcontrast imaging. Features may be related to a complex cyst/pseudocyst, potentially with rim calcification. Electronically Signed   By: Misty Stanley M.D.   On: 03/06/2019 18:32   Dg Chest Portable 1 View  Result Date: 03/06/2019 CLINICAL DATA:  Chest pain. EXAM: PORTABLE CHEST 1 VIEW COMPARISON:  None. FINDINGS: The heart size and mediastinal contours are within normal limits. Both lungs are clear. No pneumothorax or pleural effusion is noted. The visualized skeletal structures are unremarkable. IMPRESSION: No active disease. Electronically Signed   By: Marijo Conception M.D.   On: 03/06/2019 11:43   Mr Abdomen Mrcp Moise Boring Contast  Result Date: 03/06/2019 CLINICAL DATA:  Cholelithiasis. EXAM: MRI ABDOMEN WITHOUT AND WITH CONTRAST (INCLUDING MRCP) TECHNIQUE: Multiplanar multisequence MR imaging of the abdomen was performed both before and after the administration of intravenous contrast. Heavily T2-weighted images of the biliary and pancreatic ducts were obtained, and three-dimensional MRCP images were rendered by post processing. CONTRAST:  10 cc Gadavist COMPARISON:  Ultrasound exam earlier today. FINDINGS: Lower chest: Large hiatal hernia. Hepatobiliary: Mild diffuse fatty deposition noted within the liver parenchyma. No focal hepatic mass lesion. Differential perfusion of the liver noted on arterial phase imaging. Portal vein and hepatic veins are patent. Tiny layering gallstones are evident. Common duct measures 7 mm  diameter. Common bile duct in the head of the pancreas measures 4 mm diameter. There is a tiny stone visible in the distal common bile duct on MRCP imaging (see image 36 of series 6 and image 2 of series 9. Pancreas: No gross abnormality although fine detail obscured by motion artifact. No dilatation of the main pancreatic duct. Spleen: 1.1 cm T1 hyperintense, T2 hypointense lesion is identified in the upper spleen, well-marginated and slightly heterogeneous with a margin that is nearly signal void. Contrast enhancement characteristics of this lesion cannot be determined due to substantial motion degradation. Adrenals/Urinary Tract: No adrenal nodule or mass. No hydronephrosis in either kidney. Tiny subcapsular cyst noted upper pole right kidney. Stomach/Bowel: Large hiatal hernia. No small bowel or colonic dilatation within the visualized abdomen Vascular/Lymphatic: No abdominal aortic aneurysm. No abdominal lymphadenopathy. Other:  No intraperitoneal free fluid. Musculoskeletal: No abnormal marrow enhancement within the visualized bony anatomy. IMPRESSION: 1. Cholelithiasis. Extrahepatic common duct measures upper normal for patient age. There is a tiny 4 mm stone in the distal common bile duct. 2. 1.1 cm well-defined mildly heterogeneous lesion in the upper spleen. This cannot be completely characterized due to substantial motion degradation on the postcontrast imaging. Features may be related to a complex cyst/pseudocyst, potentially with rim calcification. Electronically Signed   By: Misty Stanley M.D.   On: 03/06/2019 18:32   US Abdomen Limited Ruq  Result Date: 03/06/2019 CLINICAL DATA:  Right upper quadrant pain. EXAM: ULTRASOUND ABDOMEN LIMITED RIGHT UPPER QUADRANT COMPARISON:  None. FINDINGS: Gallbladder: Cholelithiasis is identified. The gallbladder wall thickness measures 3.3 mm. No Murphy's sign or pericholecystic fluid identified. Common bile duct: Diameter: 6.6 mm Liver: Increased echogenicity  with no focal mass. Portal vein is patent on color Doppler imaging with normal direction of blood flow towards the liver. IMPRESSION: 1. The common bile duct measures 6.6 mm. 6 mm is the upper limits of normal. Recommend correlation with LFTs. If there is concern for biliary obstruction, recommend an MRCP.  2. Several small stones are seen in the gallbladder. The gallbladder wall thickness of 3.3 mm is borderline. 3 mm is considered the upper limits of normal. No pericholecystic fluid or Murphy's sign. Recommend attention to the gallbladder if the patient receives an MRCP. Otherwise, if the patient does not have an MRCP and there is concern for acute cholecystitis, recommend HIDA scan. 3. Increased echogenicity in the liver is nonspecific but often seen with hepatic steatosis. Electronically Signed   By: Dorise Bullion III M.D   On: 03/06/2019 13:59    Impression: Symptomatic cholelithiasis and tiny 4 mm stone in distal common bile duct LFTs trending down Bili 2.7/AST 335/ALT 366/ALP 282 today SARS Coronavirus 2 NEGATIVE NEGATIVE     Plan: ERCP in a.m. tomorrow with Dr. Carlean Purl. The risks(bleeding, infection, perforation, pancreatitis, anesthesia complications and even death) and the benefits(removal of CBD stone) were discussed with the patient in detail. She understands and verbalizes consent. Will be started on clear liquid diet and kept n.p.o. post midnight. She is on aspirin 81 mg, last dose today and has a large hiatal hernia noted on MRI. Surgical consultation for cholecystectomy.   LOS: 1 day   Ronnette Juniper, MD  03/07/2019, 10:56 AM  Pager [phone] If no answer or after 5 PM call [phone]

## 2019-03-08 ENCOUNTER — Inpatient Hospital Stay (HOSPITAL_COMMUNITY): Payer: Medicare Other | Admitting: Registered Nurse

## 2019-03-08 ENCOUNTER — Encounter (HOSPITAL_COMMUNITY): Payer: Self-pay | Admitting: Anesthesiology

## 2019-03-08 ENCOUNTER — Encounter (HOSPITAL_COMMUNITY)
Admission: EM | Disposition: A | Discharge status: Home / Self Care | Payer: Self-pay | Source: Ambulatory Visit | Attending: Internal Medicine

## 2019-03-08 ENCOUNTER — Inpatient Hospital Stay (HOSPITAL_COMMUNITY): Payer: Medicare Other

## 2019-03-08 DIAGNOSIS — D132 Benign neoplasm of duodenum: Secondary | ICD-10-CM

## 2019-03-08 DIAGNOSIS — K317 Polyp of stomach and duodenum: Secondary | ICD-10-CM

## 2019-03-08 HISTORY — PX: REMOVAL OF STONES: SHX5545

## 2019-03-08 HISTORY — PX: PANCREATIC STENT PLACEMENT: SHX5539

## 2019-03-08 HISTORY — PX: BIOPSY: SHX5522

## 2019-03-08 HISTORY — PX: ERCP: SHX5425

## 2019-03-08 HISTORY — PX: SPHINCTEROTOMY: SHX5544

## 2019-03-08 LAB — COMPREHENSIVE METABOLIC PANEL
ALT: 229 U/L — ABNORMAL HIGH (ref 0–44)
AST: 82 U/L — ABNORMAL HIGH (ref 15–41)
Albumin: 4 g/dL (ref 3.5–5.0)
Alkaline Phosphatase: 283 U/L — ABNORMAL HIGH (ref 38–126)
Anion gap: 10 (ref 5–15)
BUN: 10 mg/dL (ref 8–23)
CO2: 28 mmol/L (ref 22–32)
Calcium: 9 mg/dL (ref 8.9–10.3)
Chloride: 103 mmol/L (ref 98–111)
Creatinine, Ser: 0.87 mg/dL (ref 0.44–1.00)
GFR calc Af Amer: >60 mL/min (ref >60)
GFR calc non Af Amer: >60 mL/min (ref >60)
Glucose, Bld: 136 mg/dL — ABNORMAL HIGH (ref 70–99)
Potassium: 3.6 mmol/L (ref 3.5–5.1)
Sodium: 141 mmol/L (ref 135–145)
Total Bilirubin: 1.3 mg/dL — ABNORMAL HIGH (ref 0.3–1.2)
Total Protein: 7.1 g/dL (ref 6.5–8.1)

## 2019-03-08 LAB — CBC WITH DIFFERENTIAL/PLATELET
Abs Immature Granulocytes: 0.02 10*3/uL (ref 0.00–0.07)
Basophils Absolute: 0 10*3/uL (ref 0.0–0.1)
Basophils Relative: 0 %
Eosinophils Absolute: 0.3 10*3/uL (ref 0.0–0.5)
Eosinophils Relative: 4 %
HCT: 49.1 % — ABNORMAL HIGH (ref 36.0–46.0)
Hemoglobin: 16.1 g/dL — ABNORMAL HIGH (ref 12.0–15.0)
Immature Granulocytes: 0 %
Lymphocytes Relative: 26 %
Lymphs Abs: 2.3 10*3/uL (ref 0.7–4.0)
MCH: 30.7 pg (ref 26.0–34.0)
MCHC: 32.8 g/dL (ref 30.0–36.0)
MCV: 93.7 fL (ref 80.0–100.0)
Monocytes Absolute: 0.6 10*3/uL (ref 0.1–1.0)
Monocytes Relative: 7 %
Neutro Abs: 5.6 10*3/uL (ref 1.7–7.7)
Neutrophils Relative %: 63 %
Platelets: 301 10*3/uL (ref 150–400)
RBC: 5.24 MIL/uL — ABNORMAL HIGH (ref 3.87–5.11)
RDW: 13.7 % (ref 11.5–15.5)
WBC: 8.9 10*3/uL (ref 4.0–10.5)
nRBC: 0 % (ref 0.0–0.2)

## 2019-03-08 LAB — GLUCOSE, CAPILLARY
Glucose-Capillary: 218 mg/dL — ABNORMAL HIGH (ref 70–99)
Glucose-Capillary: 263 mg/dL — ABNORMAL HIGH (ref 70–99)
Glucose-Capillary: 228 mg/dL — ABNORMAL HIGH (ref 70–99)

## 2019-03-08 LAB — MAGNESIUM: Magnesium: 2.2 mg/dL (ref 1.7–2.4)

## 2019-03-08 LAB — PHOSPHORUS: Phosphorus: 2.9 mg/dL (ref 2.5–4.6)

## 2019-03-08 SURGERY — ERCP, WITH INTERVENTION IF INDICATED
Anesthesia: General

## 2019-03-08 MED ORDER — PHENYLEPHRINE 40 MCG/ML (10ML) SYRINGE FOR IV PUSH (FOR BLOOD PRESSURE SUPPORT)
PREFILLED_SYRINGE | INTRAVENOUS | Status: DC | PRN
  Administered 2019-03-08: 40 ug via INTRAVENOUS
  Administered 2019-03-08 (×4): 80 ug via INTRAVENOUS
  Administered 2019-03-08: 40 ug via INTRAVENOUS

## 2019-03-08 MED ORDER — INDOMETHACIN 50 MG RE SUPP
RECTAL | Status: DC | PRN
  Administered 2019-03-08: 100 mg via RECTAL

## 2019-03-08 MED ORDER — PROPOFOL 10 MG/ML IV BOLUS
INTRAVENOUS | Status: AC
  Filled 2019-03-08: qty 40

## 2019-03-08 MED ORDER — SODIUM CHLORIDE 0.9 % IV SOLN
INTRAVENOUS | Status: DC | PRN
  Administered 2019-03-08: 50 mL

## 2019-03-08 MED ORDER — OXYCODONE HCL 5 MG/5ML PO SOLN: 5.0000 mg | Freq: Once | ORAL | Status: DC | PRN

## 2019-03-08 MED ORDER — LACTATED RINGERS IV SOLN
INTRAVENOUS | Status: DC | PRN
  Administered 2019-03-08: 07:00:00 via INTRAVENOUS

## 2019-03-08 MED ORDER — CIPROFLOXACIN IN D5W 400 MG/200ML IV SOLN
INTRAVENOUS | Status: AC
  Filled 2019-03-08: qty 200

## 2019-03-08 MED ORDER — ROCURONIUM BROMIDE 10 MG/ML (PF) SYRINGE
PREFILLED_SYRINGE | INTRAVENOUS | Status: DC | PRN
  Administered 2019-03-08: 30 mg via INTRAVENOUS

## 2019-03-08 MED ORDER — OXYCODONE HCL 5 MG PO TABS: 5.0000 mg | ORAL_TABLET | Freq: Once | ORAL | Status: DC | PRN

## 2019-03-08 MED ORDER — PROMETHAZINE HCL 25 MG/ML IJ SOLN: 6.2500 mg | INTRAMUSCULAR | Status: DC | PRN

## 2019-03-08 MED ORDER — CIPROFLOXACIN IN D5W 400 MG/200ML IV SOLN
400.0000 mg | Freq: Two times a day (BID) | INTRAVENOUS | Status: DC
  Administered 2019-03-08 – 2019-03-10 (×5): 400 mg via INTRAVENOUS
  Filled 2019-03-08 (×5): qty 200

## 2019-03-08 MED ORDER — ONDANSETRON HCL 4 MG/2ML IJ SOLN
INTRAMUSCULAR | Status: DC | PRN
  Administered 2019-03-08: 4 mg via INTRAVENOUS

## 2019-03-08 MED ORDER — PROPOFOL 10 MG/ML IV BOLUS
INTRAVENOUS | Status: DC | PRN
  Administered 2019-03-08: 150 mg via INTRAVENOUS

## 2019-03-08 MED ORDER — INDOMETHACIN 50 MG RE SUPP
RECTAL | Status: AC
  Filled 2019-03-08: qty 2

## 2019-03-08 MED ORDER — FENTANYL CITRATE (PF) 100 MCG/2ML IJ SOLN
INTRAMUSCULAR | Status: DC | PRN
  Administered 2019-03-08: 75 ug via INTRAVENOUS
  Administered 2019-03-08: 25 ug via INTRAVENOUS

## 2019-03-08 MED ORDER — SUCCINYLCHOLINE CHLORIDE 200 MG/10ML IV SOSY
PREFILLED_SYRINGE | INTRAVENOUS | Status: DC | PRN
  Administered 2019-03-08: 120 mg via INTRAVENOUS

## 2019-03-08 MED ORDER — GLUCAGON HCL RDNA (DIAGNOSTIC) 1 MG IJ SOLR
INTRAMUSCULAR | Status: AC
  Filled 2019-03-08: qty 1

## 2019-03-08 MED ORDER — GLYCOPYRROLATE PF 0.2 MG/ML IJ SOSY
PREFILLED_SYRINGE | INTRAMUSCULAR | Status: DC | PRN
  Administered 2019-03-08: .2 mg via INTRAVENOUS

## 2019-03-08 MED ORDER — EPHEDRINE SULFATE-NACL 50-0.9 MG/10ML-% IV SOSY
PREFILLED_SYRINGE | INTRAVENOUS | Status: DC | PRN
  Administered 2019-03-08: 10 mg via INTRAVENOUS

## 2019-03-08 MED ORDER — DEXAMETHASONE SODIUM PHOSPHATE 10 MG/ML IJ SOLN
INTRAMUSCULAR | Status: DC | PRN
  Administered 2019-03-08: 6 mg via INTRAVENOUS

## 2019-03-08 MED ORDER — LIDOCAINE 2% (20 MG/ML) 5 ML SYRINGE
INTRAMUSCULAR | Status: DC | PRN
  Administered 2019-03-08: 60 mg via INTRAVENOUS

## 2019-03-08 MED ORDER — HYDROMORPHONE HCL 1 MG/ML IJ SOLN: 0.2500 mg | INTRAMUSCULAR | Status: DC | PRN

## 2019-03-08 MED ORDER — FENTANYL CITRATE (PF) 100 MCG/2ML IJ SOLN
INTRAMUSCULAR | Status: AC
  Filled 2019-03-08: qty 2

## 2019-03-08 MED ORDER — SODIUM CHLORIDE 0.9 % IV SOLN
INTRAVENOUS | Status: DC | PRN
  Administered 2019-03-08 – 2019-03-09 (×2): 250 mL via INTRAVENOUS

## 2019-03-08 MED ORDER — MEPERIDINE HCL 25 MG/ML IJ SOLN: 6.2500 mg | INTRAMUSCULAR | Status: DC | PRN

## 2019-03-08 MED ORDER — SUGAMMADEX SODIUM 200 MG/2ML IV SOLN
INTRAVENOUS | Status: DC | PRN
  Administered 2019-03-08: 200 mg via INTRAVENOUS

## 2019-03-08 NOTE — Transfer of Care (Signed)
Immediate Anesthesia Transfer of Care Note  Patient: Kendra Miles  Procedure(s) Performed: Procedure(s): ENDOSCOPIC RETROGRADE CHOLANGIOPANCREATOGRAPHY (ERCP) (N/A) SPHINCTEROTOMY REMOVAL OF STONES BIOPSY PANCREATIC STENT PLACEMENT  Patient Location: PACU  Anesthesia Type:General  Level of Consciousness:  sedated, patient cooperative and responds to stimulation  Airway & Oxygen Therapy:Patient Spontanous Breathing and Patient connected to face mask oxgen  Post-op Assessment:  Report given to PACU RN and Post -op Vital signs reviewed and stable  Post vital signs:  Reviewed and stable  Last Vitals:  Vitals:   03/08/19 0537 03/08/19 0710  BP: (!) 153/99 (!) 168/99  Pulse: 80   Resp: 18 14  Temp: 36.4 C (!) 36.4 C  SpO2: 22% 56%    Complications: No apparent anesthesia complications

## 2019-03-08 NOTE — Progress Notes (Signed)
Patient with ERCP today by Dr. Carlean Purl appreciate his help.  Stent in place he in Dr Therisa Doyne will follow up with this.  Patient plan for cholecystectomy tomorrow.  We will be available if needed.

## 2019-03-08 NOTE — Anesthesia Procedure Notes (Signed)
Procedure Name: Intubation Date/Time: 03/08/2019 7:48 AM Performed by: Lavina Hamman, CRNA Pre-anesthesia Checklist: Patient identified, Emergency Drugs available, Suction available, Patient being monitored and Timeout performed Patient Re-evaluated:Patient Re-evaluated prior to induction Oxygen Delivery Method: Circle system utilized Preoxygenation: Pre-oxygenation with 100% oxygen Induction Type: IV induction, Rapid sequence and Cricoid Pressure applied Ventilation: Mask ventilation without difficulty Laryngoscope Size: Mac and 3 Grade View: Grade II Tube type: Oral Tube size: 7.0 mm Number of attempts: 1 Airway Equipment and Method: Stylet Placement Confirmation: ETT inserted through vocal cords under direct vision,  positive ETCO2,  CO2 detector and breath sounds checked- equal and bilateral Secured at: 21 cm Tube secured with: Tape Dental Injury: Teeth and Oropharynx as per pre-operative assessment

## 2019-03-08 NOTE — Anesthesia Preprocedure Evaluation (Signed)
Anesthesia Evaluation  Patient identified by MRN, date of birth, ID band Patient awake    Reviewed: Allergy & Precautions, NPO status , Patient's Chart, lab work & pertinent test results  Airway Mallampati: II  TM Distance: >3 FB Neck ROM: Full    Dental no notable dental hx.    Pulmonary neg pulmonary ROS,    Pulmonary exam normal breath sounds clear to auscultation       Cardiovascular hypertension, Pt. on medications Normal cardiovascular exam Rhythm:Regular Rate:Normal     Neuro/Psych negative neurological ROS  negative psych ROS   GI/Hepatic Neg liver ROS, GERD  ,  Endo/Other  diabetes, Type 2  Renal/GU negative Renal ROS  negative genitourinary   Musculoskeletal negative musculoskeletal ROS (+)   Abdominal (+) + obese,   Peds negative pediatric ROS (+)  Hematology negative hematology ROS (+)   Anesthesia Other Findings   Reproductive/Obstetrics negative OB ROS                             Anesthesia Physical Anesthesia Plan  ASA: III  Anesthesia Plan: General   Post-op Pain Management:    Induction: Intravenous, Rapid sequence and Cricoid pressure planned  PONV Risk Score and Plan: 3 and Ondansetron, Dexamethasone, Midazolam and Treatment may vary due to age or medical condition  Airway Management Planned: Oral ETT  Additional Equipment:   Intra-op Plan:   Post-operative Plan: Extubation in OR  Informed Consent: I have reviewed the patients History and Physical, chart, labs and discussed the procedure including the risks, benefits and alternatives for the proposed anesthesia with the patient or authorized representative who has indicated his/her understanding and acceptance.     Dental advisory given  Plan Discussed with: CRNA  Anesthesia Plan Comments:         Anesthesia Quick Evaluation

## 2019-03-08 NOTE — Anesthesia Postprocedure Evaluation (Signed)
Anesthesia Post Note  Patient: Kendra Miles  Procedure(s) Performed: ENDOSCOPIC RETROGRADE CHOLANGIOPANCREATOGRAPHY (ERCP) (N/A ) SPHINCTEROTOMY REMOVAL OF STONES BIOPSY PANCREATIC STENT PLACEMENT     Patient location during evaluation: Endoscopy Anesthesia Type: General Level of consciousness: awake and alert Pain management: pain level controlled Vital Signs Assessment: post-procedure vital signs reviewed and stable Respiratory status: spontaneous breathing, nonlabored ventilation and respiratory function stable Cardiovascular status: blood pressure returned to baseline and stable Postop Assessment: no apparent nausea or vomiting Anesthetic complications: no    Last Vitals:  Vitals:   03/08/19 0910 03/08/19 0920  BP: (!) 154/88 (!) 162/98  Pulse: 95 91  Resp: 19 14  Temp:    SpO2: 94% 92%    Last Pain:  Vitals:   03/08/19 0853  TempSrc: Temporal  PainSc: 0-No pain                 Lynda Rainwater

## 2019-03-08 NOTE — Progress Notes (Signed)
PROGRESS NOTE    Khaleah Duer  UUV:[id number] DOB: 04-Feb-1951 DOA: 03/06/2019 PCP: Simona Huh, NP   Brief Narrative:  HPI per Dr. Lesia Sago on 03/06/2019 Mardella Nuckles is a 68 y.o. female with medical history significant for hypertension, hyperlipidemia, GERD, DM, hiatal hernia presents to the ED complaining of chest discomfort for for the past 1 day.  Patient reported pain more in the epigastric region and some radiation to the back.  Reports the pain woke her up around 2 AM, was a 9/10, sharp pain.  Today patient went to see her primary care, who noticed changes on her EKG and sent her to the ED.  Patient currently rates her pain as a 4/10, denies any nausea/vomiting, diarrhea, fever/chills, cough, shortness of breath, dizziness.  No sick contacts.  ED Course: In the ED, patient vital signs stable, labs showed alkaline phosphatase elevated at 215, AST 662, ALT 457, T bili 1.6.  Troponin levels were less than and repeat was 10.  EKG showed normal sinus rhythm, with no acute ST changes.  Limited right upper quadrant ultrasound showed common bile duct measuring about 6.6 mm, with several small stones in the gallbladder, recommended an MRCP.  EDP consulted GI Dr. Penelope Coop, who recommended an MRCP and will see patient tomorrow.  Hospitalist consulted for admission.  **Interim History Gastroenterology was consulted and they took the patient for an ERCP today and there was choledocholithiasis without cholecystitis found and there is complete stone removal was accomplished by biliary sphincterotomy and balloon extraction.  Lucency in the end of the film was felt to be air or artifact and there is also a single duodenal polyp which was biopsied.  Per report there is a slight of the blood after sphincterotomy that was stopped after a stent placement to reduce the risk of postoperative ERCP pancreatitis.  Gastroenterology also recommends general surgery consultation for cholecystectomy and this is to be  done tomorrow.  Patient abdominal pain is improved and she will be placed on a clear liquid diet currently n.p.o. after midnight for Cholecystectomy in the morning  Assessment & Plan:   Principal Problem:   Choledocholithiasis with obstruction Active Problems:   HTN (hypertension)   DM (diabetes mellitus) (HCC)   GERD (gastroesophageal reflux disease)   Polyp of stomach and duodenum  Symptomatic Cholelithiasis Choledocholithiasis without obstruction or Cholecystitis  Abnormal LFT's, trending down  Hyperbilirubinemia, trending down  -Afebrile, no leukocytosis (9.4) -Elevated alkaline phosphatase at 215, AST 662, ALT 457 on Admission -Now Alk Phos is 283, AST is 82, and ALT is 229? 2/2 to Passing of Stone  -Hepatitis panel negative  -Troponin negative, EKG with no acute ST changes -Limited right upper quadrant ultrasound showed common bile duct measuring about 6.6 mm, with several small stones in the gallbladder, recommended an MRCP -MRCP done and showed "Cholelithiasis. Extrahepatic common duct measures upper normal for patient age. There is a tiny 4 mm stone in the distal common bile duct.  1.1 cm well-defined mildly heterogeneous lesion in the upper spleen. This cannot be completely characterized due to substantial motion degradation on the postcontrast imaging. Features may be related to a complex cyst/pseudocyst, potentially with rim calcification." -Gastroenterology was consulted and Dr. Therisa Doyne evaluated the patient yesterday and recommends ERCP; ERCP done by Dr. Carlean Purl and showed choledocholithiasis without cholecystitis found and there is complete stone removal was accomplished by biliary sphincterotomy and balloon extraction.  Lucency in the end of the film was felt to be air or artifact and there is also  a single duodenal polyp which was biopsied.  Per report there is a slight of the blood after sphincterotomy that was stopped after a stent placement to reduce the risk of  postoperative ERCP pancreatitis.    -General surgery was also consulted for evaluation for cholecystectomy and will defer timing of this to them; They are planning on doing Surgery tomorrow  -In the interim patient received IV fluids with 1 L normal saline at a rate of 75 mL's per hour (now stopped) and we will continue pain control with 2 mg of IV morphine every 4 PRN for severe pain as well as antiemetics with 4 mg p.o./IV of Zofran every 6 hours as needed for nausea -Also will continue tramadol 50 mg p.o. every 6 hours as needed for moderate pain -Preoperative antibiotic started with ciprofloxacin by general surgery -Continue to monitor and trend hepatic function panel and repeat CBC and CMP in a.m.  Hypertension -Continue PTA lisinopril 2.5 mg po Daily   Diabetes Mellitus Type 2 -HbA1c was 7.5 -C/w Sensitive SSI, Accu-Cheks, Hypoglycemic protocol -Hold Home Glipizide -CBG's ranging from 88-218; Blood Sugar on CMP this AM was 165 -Continue to Monitor and adjust Insulin Scale as Needed   Hyperlipidemia -C/w Rosuvastatin 10 mg po Daily   GERD/Acid Reflux -Continue PPI with Pantoprazole 40 mg po Daily   Obesity -Estimated body mass index is 32.81 kg/m as calculated from the following:   Height as of this encounter: '5\' 6"'  (1.676 m).   Weight as of this encounter: 92.2 kg. -Weight Loss and Dietary Counseling given   Erythrocytosis -Patient's hemoglobin/hematocrit went from 14.6/44.1 is now 60.1/49.1 -Continue to monitor and will consider restarting IV fluids if consistently remaining elevated -Repeat CBC in a.m.  DVT prophylaxis: Enoxaparin 40 mg sq q24h Code Status: FULL CODE  Family Communication: No family present at bedside  Disposition Plan: Pending Further GI and Surgical Workup   Consultants:   Gastroenterology   General Surgery    Procedures: MRCP  ERCP  Findings:      The scout film was normal. Esophagus NL as best I could tell w/       duodenoscope.  Large hiatal hernia, stomach otherwise normal. Small major       papilla - difficult to get underneath. wire into - injected and CBD and       PD filled. Left a wire in pancreatic duct and then cannulated CBD.       Injections showed small dstal CBD stone, mildly dilated bilioary tree       and gallbladder w./ stones. Pancreatic duct was NL. Biliary       sphincterotomy w/ wire in CBD - difficult - some char and edema - hard       to get position to limit wire contact. Was able to get that done and       balloon sweep removed stone (multiple sweeps0 with negative occlusion       cholangioogram. Papilla was oozing blood slowly and edematous. 4 Fr 3 cm       pigtail stent placed into pancreatic duct. Bleeding stopped with this so       suspect that applied peressure to the oozing site.      Then I saw a 10-12 mm lateral wall duodenal polyp - sessile - biopsied x       4. Scope out. On end film there is a slight lucency in distal CBD that       is air or  other artifact I think. Impression:               - Choledocholithiasis without cholecystitis was                            found. Complete stone removal was accomplished by                            biliary sphincterotomy and balloon extraction.                            Lucency on end film I think air or other artifact.                           - A single duodenal polyp. Biopsied.                           Slight ooze of blood after sphincterotomy - this                            stopped after I placed 4 Fr 3 cm pigtail PD stent                            to reduce risk of post-ERCP pancreatitis   Antimicrobials:  Anti-infectives (From admission, onward)   Start     Dose/Rate Route Frequency Ordered Stop   03/08/19 0800  ciprofloxacin (CIPRO) IVPB 400 mg     400 mg 200 mL/hr over 60 Minutes Intravenous Every 12 hours 03/08/19 0716       Subjective: Seen and examined at bedside and had just come back from her ERCP and was in no  acute distress.  She is wearing supplemental oxygen via nasal cannula currently after being sedated.  No chest pain, lightheadedness or dizziness.  No abdominal pain currently.  No other concerns at present this time.  Objective: Vitals:   03/08/19 0853 03/08/19 0900 03/08/19 0910 03/08/19 0920  BP: (!) 156/105 (!) 161/74 (!) 154/88 (!) 162/98  Pulse: 89 100 95 91  Resp: '14 17 19 14  ' Temp: 98.4 F (36.9 C)     TempSrc: Temporal     SpO2: 97% 97% 94% 92%  Weight:      Height:        Intake/Output Summary (Last 24 hours) at 03/08/2019 1122 Last data filed at 03/08/2019 3500 Gross per 24 hour  Intake 1968.65 ml  Output 2615 ml  Net -646.35 ml   Filed Weights   03/06/19 1043 03/06/19 1843 03/08/19 0710  Weight: 93 kg 92.2 kg 92.2 kg   Examination: Physical Exam:  Constitutional: Well-nourished, well-developed obese Caucasian female currently no acute distress appears calm and slightly drowsy from coming back from her procedure Eyes: Lids and conjunctive are normal.  Sclera anicteric ENMT: External ears and nose appear normal.  Grossly normal hearing. Neck: Appears supple with no JVD Respiratory: Diminished auscultation bilaterally with no appreciable wheezing, rales, rhonchi.  Patient not tachypneic or using any accessory muscles to breathe but she is wearing supplemental oxygen via nasal cannula after she had just come back from being sedated for her ERCP Cardiovascular: Regular rate and rhythm.  No appreciable murmurs, rubs, gallops.  Mild  lower extremity edema noted Abdomen: Soft, nontender, distended secondary to body habitus.  Bowel sounds present GU: Deferred Musculoskeletal: No contractures or cyanosis.  No joint deformities in upper or lower extremities Skin: Skin is warm and dry no appreciable rashes or lesions on to skin evaluation Neurologic:.  Cranial nerves II through XII gross intact no visual focal deficits.  Romberg sign and cerebellar reflexes were not  assessed Psychiatric: Normal pleasant mood and affect.  Intact judgment insight.  Patient is awake, alert, oriented x3  Data Reviewed: I have personally reviewed following labs and imaging studies  CBC: Recent Labs  Lab 03/06/19 1105 03/07/19 0558 03/08/19 0656  WBC 8.6 9.4 8.9  NEUTROABS 6.5  --  5.6  HGB 14.9 14.6 16.1*  HCT 44.9 44.1 49.1*  MCV 91.4 92.3 93.7  PLT 271 310 921   Basic Metabolic Panel: Recent Labs  Lab 03/06/19 1105 03/07/19 0558 03/08/19 0656  NA 142 141 141  K 3.9 3.9 3.6  CL 104 105 103  CO2 '28 27 28  ' GLUCOSE 123* 165* 136*  BUN '13 11 10  ' CREATININE 0.75 0.71 0.87  CALCIUM 9.3 8.7* 9.0  MG  --   --  2.2  PHOS  --   --  2.9   GFR: Estimated Creatinine Clearance: 73.8 mL/min (by C-G formula based on SCr of 0.87 mg/dL). Liver Function Tests: Recent Labs  Lab 03/06/19 1105 03/07/19 0558 03/08/19 0656  AST 662* 335* 82*  ALT 457* 366* 229*  ALKPHOS 215* 282* 283*  BILITOT 1.6* 2.7* 1.3*  PROT 6.7 6.2* 7.1  ALBUMIN 4.0 3.6 4.0   Recent Labs  Lab 03/06/19 1105  LIPASE 32   No results for input(s): AMMONIA in the last 168 hours. Coagulation Profile: Recent Labs  Lab 03/06/19 1105  INR 1.0   Cardiac Enzymes: No results for input(s): CKTOTAL, CKMB, CKMBINDEX, TROPONINI in the last 168 hours. BNP (last 3 results) No results for input(s): PROBNP in the last 8760 hours. HbA1C: Recent Labs    03/07/19 0558  HGBA1C 7.5*   CBG: Recent Labs  Lab 03/06/19 1639 03/07/19 0710 03/07/19 1109 03/07/19 1604 03/08/19 1119  GLUCAP 79 154* 88 126* 218*   Lipid Profile: No results for input(s): CHOL, HDL, LDLCALC, TRIG, CHOLHDL, LDLDIRECT in the last 72 hours. Thyroid Function Tests: No results for input(s): TSH, T4TOTAL, FREET4, T3FREE, THYROIDAB in the last 72 hours. Anemia Panel: No results for input(s): VITAMINB12, FOLATE, FERRITIN, TIBC, IRON, RETICCTPCT in the last 72 hours. Sepsis Labs: No results for input(s): PROCALCITON,  LATICACIDVEN in the last 168 hours.  Recent Results (from the past 240 hour(s))  SARS Coronavirus 2 (CEPHEID - Performed in [place] hospital lab), Hosp Order     Status: None   Collection Time: 03/06/19  2:36 PM   Specimen: Nasopharyngeal Swab  Result Value Ref Range Status   SARS Coronavirus 2 NEGATIVE NEGATIVE Final    Comment: (NOTE) If result is NEGATIVE SARS-CoV-2 target nucleic acids are NOT DETECTED. The SARS-CoV-2 RNA is generally detectable in upper and lower  respiratory specimens during the acute phase of infection. The lowest  concentration of SARS-CoV-2 viral copies this assay can detect is 250  copies / mL. A negative result does not preclude SARS-CoV-2 infection  and should not be used as the sole basis for treatment or other  patient management decisions.  A negative result may occur with  improper specimen collection / handling, submission of specimen other  than nasopharyngeal swab, presence of  viral mutation(s) within the  areas targeted by this assay, and inadequate number of viral copies  (<250 copies / mL). A negative result must be combined with clinical  observations, patient history, and epidemiological information. If result is POSITIVE SARS-CoV-2 target nucleic acids are DETECTED. The SARS-CoV-2 RNA is generally detectable in upper and lower  respiratory specimens dur ing the acute phase of infection.  Positive  results are indicative of active infection with SARS-CoV-2.  Clinical  correlation with patient history and other diagnostic information is  necessary to determine patient infection status.  Positive results do  not rule out bacterial infection or co-infection with other viruses. If result is PRESUMPTIVE POSTIVE SARS-CoV-2 nucleic acids MAY BE PRESENT.   A presumptive positive result was obtained on the submitted specimen  and confirmed on repeat testing.  While 2019 novel coronavirus  (SARS-CoV-2) nucleic acids may be present in the submitted  sample  additional confirmatory testing may be necessary for epidemiological  and / or clinical management purposes  to differentiate between  SARS-CoV-2 and other Sarbecovirus currently known to infect humans.  If clinically indicated additional testing with an alternate test  methodology [phone]) is advised. The SARS-CoV-2 RNA is generally  detectable in upper and lower respiratory sp ecimens during the acute  phase of infection. The expected result is Negative. Fact Sheet for Patients:  StrictlyIdeas.no Fact Sheet for Healthcare Providers: BankingDealers.co.za This test is not yet approved or cleared by the [place] FDA and has been authorized for detection and/or diagnosis of SARS-CoV-2 by FDA under an Emergency Use Authorization (EUA).  This EUA will remain in effect (meaning this test can be used) for the duration of the COVID-19 declaration under Section 564(b)(1) of the Act, 21 U.S.C. section 360bbb-3(b)(1), unless the authorization is terminated or revoked sooner. Performed at [facility], [place] [address] [place],  [address]     Radiology Studies: Mr 3d Recon At Scanner  Result Date: 03/06/2019 CLINICAL DATA:  Cholelithiasis. EXAM: MRI ABDOMEN WITHOUT AND WITH CONTRAST (INCLUDING MRCP) TECHNIQUE: Multiplanar multisequence MR imaging of the abdomen was performed both before and after the administration of intravenous contrast. Heavily T2-weighted images of the biliary and pancreatic ducts were obtained, and three-dimensional MRCP images were rendered by post processing. CONTRAST:  10 cc Gadavist COMPARISON:  Ultrasound exam earlier today. FINDINGS: Lower chest: Large hiatal hernia. Hepatobiliary: Mild diffuse fatty deposition noted within the liver parenchyma. No focal hepatic mass lesion. Differential perfusion of the liver noted on arterial phase imaging. Portal vein and hepatic veins are patent. Tiny  layering gallstones are evident. Common duct measures 7 mm diameter. Common bile duct in the head of the pancreas measures 4 mm diameter. There is a tiny stone visible in the distal common bile duct on MRCP imaging (see image 36 of series 6 and image 2 of series 9. Pancreas: No gross abnormality although fine detail obscured by motion artifact. No dilatation of the main pancreatic duct. Spleen: 1.1 cm T1 hyperintense, T2 hypointense lesion is identified in the upper spleen, well-marginated and slightly heterogeneous with a margin that is nearly signal void. Contrast enhancement characteristics of this lesion cannot be determined due to substantial motion degradation. Adrenals/Urinary Tract: No adrenal nodule or mass. No hydronephrosis in either kidney. Tiny subcapsular cyst noted upper pole right kidney. Stomach/Bowel: Large hiatal hernia. No small bowel or colonic dilatation within the visualized abdomen Vascular/Lymphatic: No abdominal aortic aneurysm. No abdominal lymphadenopathy. Other:  No intraperitoneal free fluid. Musculoskeletal: No abnormal marrow  enhancement within the visualized bony anatomy. IMPRESSION: 1. Cholelithiasis. Extrahepatic common duct measures upper normal for patient age. There is a tiny 4 mm stone in the distal common bile duct. 2. 1.1 cm well-defined mildly heterogeneous lesion in the upper spleen. This cannot be completely characterized due to substantial motion degradation on the postcontrast imaging. Features may be related to a complex cyst/pseudocyst, potentially with rim calcification. Electronically Signed   By: Misty Stanley M.D.   On: 03/06/2019 18:32   Dg Chest Portable 1 View  Result Date: 03/06/2019 CLINICAL DATA:  Chest pain. EXAM: PORTABLE CHEST 1 VIEW COMPARISON:  None. FINDINGS: The heart size and mediastinal contours are within normal limits. Both lungs are clear. No pneumothorax or pleural effusion is noted. The visualized skeletal structures are unremarkable.  IMPRESSION: No active disease. Electronically Signed   By: Marijo Conception M.D.   On: 03/06/2019 11:43   Dg Ercp Biliary & Pancreatic Ducts  Result Date: 03/08/2019 CLINICAL DATA:  Cholelithiasis with suspected choledocholithiasis on MRCP EXAM: ERCP TECHNIQUE: Multiple spot images obtained with the fluoroscopic device and submitted for interpretation post-procedure. COMPARISON:  MRCP 03/06/2019 FINDINGS: A series of fluoroscopic spot images document endoscopic cannulation of the CBD. Multiple filling defects in the distal CBD on initial images. Subsequent balloon catheter passage. Cystic duct is patent. Limited opacification of visualization of intrahepatic biliary tree, which appears relatively decompressed centrally. No extravasation. IMPRESSION: Probable choledocholithiasis, with endoscopic CBD cannulation and intervention. These images were submitted for radiologic interpretation only. Please see the procedural report for the amount of contrast and the fluoroscopy time utilized. Electronically Signed   By: Lucrezia Europe M.D.   On: 03/08/2019 10:04   Mr Abdomen Mrcp Moise Boring Contast  Result Date: 03/06/2019 CLINICAL DATA:  Cholelithiasis. EXAM: MRI ABDOMEN WITHOUT AND WITH CONTRAST (INCLUDING MRCP) TECHNIQUE: Multiplanar multisequence MR imaging of the abdomen was performed both before and after the administration of intravenous contrast. Heavily T2-weighted images of the biliary and pancreatic ducts were obtained, and three-dimensional MRCP images were rendered by post processing. CONTRAST:  10 cc Gadavist COMPARISON:  Ultrasound exam earlier today. FINDINGS: Lower chest: Large hiatal hernia. Hepatobiliary: Mild diffuse fatty deposition noted within the liver parenchyma. No focal hepatic mass lesion. Differential perfusion of the liver noted on arterial phase imaging. Portal vein and hepatic veins are patent. Tiny layering gallstones are evident. Common duct measures 7 mm diameter. Common bile duct in the head  of the pancreas measures 4 mm diameter. There is a tiny stone visible in the distal common bile duct on MRCP imaging (see image 36 of series 6 and image 2 of series 9. Pancreas: No gross abnormality although fine detail obscured by motion artifact. No dilatation of the main pancreatic duct. Spleen: 1.1 cm T1 hyperintense, T2 hypointense lesion is identified in the upper spleen, well-marginated and slightly heterogeneous with a margin that is nearly signal void. Contrast enhancement characteristics of this lesion cannot be determined due to substantial motion degradation. Adrenals/Urinary Tract: No adrenal nodule or mass. No hydronephrosis in either kidney. Tiny subcapsular cyst noted upper pole right kidney. Stomach/Bowel: Large hiatal hernia. No small bowel or colonic dilatation within the visualized abdomen Vascular/Lymphatic: No abdominal aortic aneurysm. No abdominal lymphadenopathy. Other:  No intraperitoneal free fluid. Musculoskeletal: No abnormal marrow enhancement within the visualized bony anatomy. IMPRESSION: 1. Cholelithiasis. Extrahepatic common duct measures upper normal for patient age. There is a tiny 4 mm stone in the distal common bile duct. 2. 1.1 cm well-defined mildly heterogeneous lesion  in the upper spleen. This cannot be completely characterized due to substantial motion degradation on the postcontrast imaging. Features may be related to a complex cyst/pseudocyst, potentially with rim calcification. Electronically Signed   By: Misty Stanley M.D.   On: 03/06/2019 18:32   US Abdomen Limited Ruq  Result Date: 03/06/2019 CLINICAL DATA:  Right upper quadrant pain. EXAM: ULTRASOUND ABDOMEN LIMITED RIGHT UPPER QUADRANT COMPARISON:  None. FINDINGS: Gallbladder: Cholelithiasis is identified. The gallbladder wall thickness measures 3.3 mm. No Murphy's sign or pericholecystic fluid identified. Common bile duct: Diameter: 6.6 mm Liver: Increased echogenicity with no focal mass. Portal vein is patent  on color Doppler imaging with normal direction of blood flow towards the liver. IMPRESSION: 1. The common bile duct measures 6.6 mm. 6 mm is the upper limits of normal. Recommend correlation with LFTs. If there is concern for biliary obstruction, recommend an MRCP. 2. Several small stones are seen in the gallbladder. The gallbladder wall thickness of 3.3 mm is borderline. 3 mm is considered the upper limits of normal. No pericholecystic fluid or Murphy's sign. Recommend attention to the gallbladder if the patient receives an MRCP. Otherwise, if the patient does not have an MRCP and there is concern for acute cholecystitis, recommend HIDA scan. 3. Increased echogenicity in the liver is nonspecific but often seen with hepatic steatosis. Electronically Signed   By: Dorise Bullion III M.D   On: 03/06/2019 13:59   Scheduled Meds:  Derrill Memo ON 03/09/2019] aspirin EC  81 mg Oral Daily   cholecalciferol  1,000 Units Oral Daily   enoxaparin (LOVENOX) injection  40 mg Subcutaneous Q24H   insulin aspart  0-9 Units Subcutaneous TID WC   lisinopril  2.5 mg Oral Daily   multivitamin with minerals  1 tablet Oral Daily   omega-3 acid ethyl esters  1 g Oral Daily   pantoprazole  40 mg Oral Daily   rosuvastatin  10 mg Oral Daily   Continuous Infusions:  ciprofloxacin      LOS: 2 days   Kerney Elbe, DO [facility] PAGER is on AMION  If 7PM-7AM, please contact night-coverage [email or web address] Password [facility] 03/08/2019, 11:22 AM

## 2019-03-08 NOTE — Progress Notes (Signed)
   I called daughter-in-lae Antony Odea and explained ERCP results/uupdated.  Gatha Mayer, MD, [facility] Gastroenterology 03/08/2019 9:33 AM Pager [phone]

## 2019-03-08 NOTE — Interval H&P Note (Signed)
History and Physical Interval Note:  03/08/2019 7:05 AM  Kendra Miles  has presented today for surgery, with the diagnosis of CBD stone.  The various methods of treatment have been discussed with the patient and family. After consideration of risks, benefits and other options for treatment, the patient has consented to  Procedure(s): ENDOSCOPIC RETROGRADE CHOLANGIOPANCREATOGRAPHY (ERCP) (N/A) as a surgical intervention.  The patient's history has been reviewed, patient examined, no change in status, stable for surgery.  I have reviewed the patient's chart and labs.  Questions were answered to the patient's satisfaction.     Silvano Rusk

## 2019-03-08 NOTE — Progress Notes (Addendum)
Choledocholithiasis with obstruction  Subjective: Choledocholithiasis  Objective: Vital signs in last 24 hours: Temp:  [97.5 F (36.4 C)-99.1 F (37.3 C)] 98.4 F (36.9 C) (07/18 0853) Pulse Rate:  [72-100] 91 (07/18 0920) Resp:  [14-19] 14 (07/18 0920) BP: (135-168)/(74-105) 162/98 (07/18 0920) SpO2:  [90 %-97 %] 92 % (07/18 0920) Weight:  [92.2 kg] 92.2 kg (07/18 0710) Last BM Date: 03/06/19  Intake/Output from previous day: 07/17 0701 - 07/18 0700 In: 1168.7 [P.O.:960; I.V.:208.7] Out: 2600 [Urine:2600] Intake/Output this shift: Total I/O In: 800 [I.V.:800] Out: 15 [Blood:15]  General appearance: alert and cooperative GI: normal findings: soft, non-tender   Lab Results:  Results for orders placed or performed during the hospital encounter of 03/06/19 (from the past 24 hour(s))  Glucose, capillary     Status: None   Collection Time: 03/07/19 11:09 AM  Result Value Ref Range   Glucose-Capillary 88 70 - 99 mg/dL   Comment 1 Notify RN    Comment 2 Document in Chart   Glucose, capillary     Status: Abnormal   Collection Time: 03/07/19  4:04 PM  Result Value Ref Range   Glucose-Capillary 126 (H) 70 - 99 mg/dL   Comment 1 Notify RN    Comment 2 Document in Chart   CBC with Differential/Platelet     Status: Abnormal   Collection Time: 03/08/19  6:56 AM  Result Value Ref Range   WBC 8.9 4.0 - 10.5 K/uL   RBC 5.24 (H) 3.87 - 5.11 MIL/uL   Hemoglobin 16.1 (H) 12.0 - 15.0 g/dL   HCT 49.1 (H) 36.0 - 46.0 %   MCV 93.7 80.0 - 100.0 fL   MCH 30.7 26.0 - 34.0 pg   MCHC 32.8 30.0 - 36.0 g/dL   RDW 13.7 11.5 - 15.5 %   Platelets 301 150 - 400 K/uL   nRBC 0.0 0.0 - 0.2 %   Neutrophils Relative % 63 %   Neutro Abs 5.6 1.7 - 7.7 K/uL   Lymphocytes Relative 26 %   Lymphs Abs 2.3 0.7 - 4.0 K/uL   Monocytes Relative 7 %   Monocytes Absolute 0.6 0.1 - 1.0 K/uL   Eosinophils Relative 4 %   Eosinophils Absolute 0.3 0.0 - 0.5 K/uL   Basophils Relative 0 %   Basophils Absolute  0.0 0.0 - 0.1 K/uL   Immature Granulocytes 0 %   Abs Immature Granulocytes 0.02 0.00 - 0.07 K/uL  Comprehensive metabolic panel     Status: Abnormal   Collection Time: 03/08/19  6:56 AM  Result Value Ref Range   Sodium 141 135 - 145 mmol/L   Potassium 3.6 3.5 - 5.1 mmol/L   Chloride 103 98 - 111 mmol/L   CO2 28 22 - 32 mmol/L   Glucose, Bld 136 (H) 70 - 99 mg/dL   BUN 10 8 - 23 mg/dL   Creatinine, Ser 0.87 0.44 - 1.00 mg/dL   Calcium 9.0 8.9 - 10.3 mg/dL   Total Protein 7.1 6.5 - 8.1 g/dL   Albumin 4.0 3.5 - 5.0 g/dL   AST 82 (H) 15 - 41 U/L   ALT 229 (H) 0 - 44 U/L   Alkaline Phosphatase 283 (H) 38 - 126 U/L   Total Bilirubin 1.3 (H) 0.3 - 1.2 mg/dL   GFR calc non Af Amer >60 >60 mL/min   GFR calc Af Amer >60 >60 mL/min   Anion gap 10 5 - 15  Magnesium     Status: None  Collection Time: 03/08/19  6:56 AM  Result Value Ref Range   Magnesium 2.2 1.7 - 2.4 mg/dL  Phosphorus     Status: None   Collection Time: 03/08/19  6:56 AM  Result Value Ref Range   Phosphorus 2.9 2.5 - 4.6 mg/dL     Studies/Results Radiology     MEDS, Scheduled . [START ON 03/09/2019] aspirin EC  81 mg Oral Daily  . cholecalciferol  1,000 Units Oral Daily  . enoxaparin (LOVENOX) injection  40 mg Subcutaneous Q24H  . insulin aspart  0-9 Units Subcutaneous TID WC  . lisinopril  2.5 mg Oral Daily  . multivitamin with minerals  1 tablet Oral Daily  . omega-3 acid ethyl esters  1 g Oral Daily  . pantoprazole  40 mg Oral Daily  . rosuvastatin  10 mg Oral Daily     Assessment: Choledocholithiasis with obstruction S/p ERCP with stone extraction  Plan: OR in AM for lap chole. Jasper for clears today and NPO p MN  LOS: 2 days    Rosario Adie, MD [facility], [place] [phone]   03/08/2019 9:47 AM

## 2019-03-08 NOTE — Op Note (Signed)
[facility] Patient Name: Kendra Miles Procedure Date: 03/08/2019 MRN: [id number] Attending MD: Gatha Mayer , MD Date of Birth: [birth year] CSN: [id number] Age: 68 Admit Type: Inpatient Procedure:                ERCP Indications:              Common bile duct stone(s) Providers:                Gatha Mayer, MD, Carlyn Reichert, RN, Laverda Sorenson, Technician, Arnoldo Hooker, CRNA Referring MD:              Medicines:                General Anesthesia, Cipro 672 mg IV Complications:            No immediate complications. Estimated Blood Loss:     Estimated blood loss was minimal. Procedure:                Pre-Anesthesia Assessment:                           - Prior to the procedure, a History and Physical                            was performed, and patient medications and                            allergies were reviewed. The patient's tolerance of                            previous anesthesia was also reviewed. The risks                            and benefits of the procedure and the sedation                            options and risks were discussed with the patient.                            All questions were answered, and informed consent                            was obtained. Prior Anticoagulants: The patient                            last took Lovenox (enoxaparin) 1 day prior to the                            procedure. ASA Grade Assessment: III - A patient                            with severe systemic disease. After reviewing the  risks and benefits, the patient was deemed in                            satisfactory condition to undergo the procedure.                           After obtaining informed consent, the scope was                            passed under direct vision. Throughout the                            procedure, the patient's blood pressure, pulse, and   oxygen saturations were monitored continuously. The                            TJF-Q180V ([id number]) Olympus duodenoscope was                            introduced through the mouth, and used to inject                            contrast into and used to inject contrast into the                            bile duct and ventral pancreatic duct. The ERCP was                            somewhat difficult due to inadequate patient                            positioning. The patient tolerated the procedure                            well. Scope In: Scope Out: Findings:      The scout film was normal. Esophagus NL as best I could tell w/       duodenoscope. Large hiatal hernia, stomach otherwise normal. Small major       papilla - difficult to get underneath. wire into - injected and CBD and       PD filled. Left a wire in pancreatic duct and then cannulated CBD.       Injections showed small dstal CBD stone, mildly dilated bilioary tree       and gallbladder w./ stones. Pancreatic duct was NL. Biliary       sphincterotomy w/ wire in CBD - difficult - some char and edema - hard       to get position to limit wire contact. Was able to get that done and       balloon sweep removed stone (multiple sweeps0 with negative occlusion       cholangioogram. Papilla was oozing blood slowly and edematous. 4 Fr 3 cm       pigtail stent placed into pancreatic duct. Bleeding stopped with this so       suspect that applied peressure to the oozing site.      Then I saw  a 10-12 mm lateral wall duodenal polyp - sessile - biopsied x       4. Scope out. On end film there is a slight lucency in distal CBD that       is air or other artifact I think. Impression:               - Choledocholithiasis without cholecystitis was                            found. Complete stone removal was accomplished by                            biliary sphincterotomy and balloon extraction.                            Lucency on end film  I think air or other artifact.                           - A single duodenal polyp. Biopsied.                           Slight ooze of blood after sphincterotomy - this                            stopped after I placed 4 Fr 3 cm pigtail PD stent                            to reduce risk of post-ERCP pancreatitis Moderate Sedation:      Not Applicable - Patient had care per Anesthesia. Recommendation:           - Return patient to hospital ward for ongoing care.                           - Clears only today                           Lap chole per [facility] - tomorrow if she is ok and they                            are able                           I will f/u polyp path and coordinate f/u Dr. Therisa Doyne                            and colleagues                           [facility] in 1-2 weeks to check for stent passage and if                            not EGD to remove Procedure Code(s):        --- Professional ---  10211, Endoscopic retrograde                            cholangiopancreatography (ERCP); diagnostic,                            including collection of specimen(s) by brushing or                            washing, when performed (separate procedure) Diagnosis Code(s):        --- Professional ---                           K80.50, Calculus of bile duct without cholangitis                            or cholecystitis without obstruction                           K31.7, Polyp of stomach and duodenum CPT copyright 2019 [employer]. All rights reserved. The codes documented in this report are preliminary and upon coder review may  be revised to meet current compliance requirements. Gatha Mayer, MD 03/08/2019 9:03:36 AM This report has been signed electronically. Number of Addenda: 0

## 2019-03-09 ENCOUNTER — Inpatient Hospital Stay (HOSPITAL_COMMUNITY): Payer: Medicare Other | Admitting: Registered Nurse

## 2019-03-09 ENCOUNTER — Encounter (HOSPITAL_COMMUNITY)
Admission: EM | Disposition: A | Discharge status: Home / Self Care | Payer: Self-pay | Source: Ambulatory Visit | Attending: Internal Medicine

## 2019-03-09 ENCOUNTER — Encounter (HOSPITAL_COMMUNITY): Payer: Self-pay | Admitting: Registered Nurse

## 2019-03-09 HISTORY — PX: CHOLECYSTECTOMY: SHX55

## 2019-03-09 LAB — COMPREHENSIVE METABOLIC PANEL
ALT: 165 U/L — ABNORMAL HIGH (ref 0–44)
AST: 91 U/L — ABNORMAL HIGH (ref 15–41)
Albumin: 3.3 g/dL — ABNORMAL LOW (ref 3.5–5.0)
Alkaline Phosphatase: 254 U/L — ABNORMAL HIGH (ref 38–126)
Anion gap: 9 (ref 5–15)
BUN: 17 mg/dL (ref 8–23)
CO2: 28 mmol/L (ref 22–32)
Calcium: 8.9 mg/dL (ref 8.9–10.3)
Chloride: 103 mmol/L (ref 98–111)
Creatinine, Ser: 0.9 mg/dL (ref 0.44–1.00)
GFR calc Af Amer: >60 mL/min (ref >60)
GFR calc non Af Amer: >60 mL/min (ref >60)
Glucose, Bld: 171 mg/dL — ABNORMAL HIGH (ref 70–99)
Potassium: 4 mmol/L (ref 3.5–5.1)
Sodium: 140 mmol/L (ref 135–145)
Total Bilirubin: 2.9 mg/dL — ABNORMAL HIGH (ref 0.3–1.2)
Total Protein: 5.8 g/dL — ABNORMAL LOW (ref 6.5–8.1)

## 2019-03-09 LAB — CBC
HCT: 41.5 % (ref 36.0–46.0)
Hemoglobin: 13.5 g/dL (ref 12.0–15.0)
MCH: 30 pg (ref 26.0–34.0)
MCHC: 32.5 g/dL (ref 30.0–36.0)
MCV: 92.2 fL (ref 80.0–100.0)
Platelets: 306 10*3/uL (ref 150–400)
RBC: 4.5 MIL/uL (ref 3.87–5.11)
RDW: 13.3 % (ref 11.5–15.5)
WBC: 16.4 10*3/uL — ABNORMAL HIGH (ref 4.0–10.5)
nRBC: 0 % (ref 0.0–0.2)

## 2019-03-09 LAB — PHOSPHORUS: Phosphorus: 3.2 mg/dL (ref 2.5–4.6)

## 2019-03-09 LAB — GLUCOSE, CAPILLARY
Glucose-Capillary: 158 mg/dL — ABNORMAL HIGH (ref 70–99)
Glucose-Capillary: 167 mg/dL — ABNORMAL HIGH (ref 70–99)
Glucose-Capillary: 175 mg/dL — ABNORMAL HIGH (ref 70–99)
Glucose-Capillary: 307 mg/dL — ABNORMAL HIGH (ref 70–99)
Glucose-Capillary: 234 mg/dL — ABNORMAL HIGH (ref 70–99)

## 2019-03-09 LAB — AMYLASE: Amylase: 39 U/L (ref 28–100)

## 2019-03-09 LAB — LIPASE, BLOOD: Lipase: 28 U/L (ref 11–51)

## 2019-03-09 LAB — MAGNESIUM: Magnesium: 2.3 mg/dL (ref 1.7–2.4)

## 2019-03-09 SURGERY — LAPAROSCOPIC CHOLECYSTECTOMY WITH INTRAOPERATIVE CHOLANGIOGRAM
Anesthesia: General | Site: Abdomen

## 2019-03-09 MED ORDER — EPHEDRINE SULFATE 50 MG/ML IJ SOLN
INTRAMUSCULAR | Status: DC | PRN
  Administered 2019-03-09: 10 mg via INTRAVENOUS

## 2019-03-09 MED ORDER — BUPIVACAINE-EPINEPHRINE (PF) 0.25% -1:200000 IJ SOLN
INTRAMUSCULAR | Status: AC
  Filled 2019-03-09: qty 30

## 2019-03-09 MED ORDER — FENTANYL CITRATE (PF) 250 MCG/5ML IJ SOLN
INTRAMUSCULAR | Status: AC
  Filled 2019-03-09: qty 5

## 2019-03-09 MED ORDER — SUGAMMADEX SODIUM 200 MG/2ML IV SOLN
INTRAVENOUS | Status: DC | PRN
  Administered 2019-03-09: 200 mg via INTRAVENOUS

## 2019-03-09 MED ORDER — 0.9 % SODIUM CHLORIDE (POUR BTL) OPTIME
TOPICAL | Status: DC | PRN
  Administered 2019-03-09: 1000 mL

## 2019-03-09 MED ORDER — MORPHINE SULFATE (PF) 2 MG/ML IV SOLN
1.0000 mg | INTRAVENOUS | Status: DC | PRN
  Administered 2019-03-09 – 2019-03-10 (×2): 2 mg via INTRAVENOUS
  Filled 2019-03-09 (×2): qty 1

## 2019-03-09 MED ORDER — HYDROMORPHONE HCL 1 MG/ML IJ SOLN: 0.2500 mg | INTRAMUSCULAR | Status: DC | PRN

## 2019-03-09 MED ORDER — MEPERIDINE HCL 50 MG/ML IJ SOLN: 6.2500 mg | INTRAMUSCULAR | Status: DC | PRN

## 2019-03-09 MED ORDER — PROPOFOL 10 MG/ML IV BOLUS
INTRAVENOUS | Status: AC
  Filled 2019-03-09: qty 20

## 2019-03-09 MED ORDER — ONDANSETRON HCL 4 MG/2ML IJ SOLN
INTRAMUSCULAR | Status: AC
  Filled 2019-03-09: qty 2

## 2019-03-09 MED ORDER — EPHEDRINE 5 MG/ML INJ
INTRAVENOUS | Status: AC
  Filled 2019-03-09: qty 10

## 2019-03-09 MED ORDER — MIDAZOLAM HCL 2 MG/2ML IJ SOLN
INTRAMUSCULAR | Status: AC
  Filled 2019-03-09: qty 2

## 2019-03-09 MED ORDER — ONDANSETRON HCL 4 MG/2ML IJ SOLN
INTRAMUSCULAR | Status: DC | PRN
  Administered 2019-03-09: 4 mg via INTRAVENOUS

## 2019-03-09 MED ORDER — SUCCINYLCHOLINE CHLORIDE 200 MG/10ML IV SOSY
PREFILLED_SYRINGE | INTRAVENOUS | Status: DC | PRN
  Administered 2019-03-09: 50 mg via INTRAVENOUS
  Administered 2019-03-09: 140 mg via INTRAVENOUS
  Administered 2019-03-09: 50 mg via INTRAVENOUS

## 2019-03-09 MED ORDER — HYDROMORPHONE HCL 1 MG/ML IJ SOLN
INTRAMUSCULAR | Status: AC
  Administered 2019-03-09: 0.5 mg
  Filled 2019-03-09: qty 1

## 2019-03-09 MED ORDER — HYDROCODONE-ACETAMINOPHEN 5-325 MG PO TABS
1.0000 | ORAL_TABLET | ORAL | Status: DC | PRN
  Administered 2019-03-09: 1 via ORAL
  Administered 2019-03-09 – 2019-03-10 (×2): 2 via ORAL
  Filled 2019-03-09 (×3): qty 2

## 2019-03-09 MED ORDER — FENTANYL CITRATE (PF) 100 MCG/2ML IJ SOLN
INTRAMUSCULAR | Status: DC | PRN
  Administered 2019-03-09: 100 ug via INTRAVENOUS
  Administered 2019-03-09 (×2): 50 ug via INTRAVENOUS

## 2019-03-09 MED ORDER — LABETALOL HCL 5 MG/ML IV SOLN
INTRAVENOUS | Status: AC
  Filled 2019-03-09: qty 4

## 2019-03-09 MED ORDER — PHENYLEPHRINE 40 MCG/ML (10ML) SYRINGE FOR IV PUSH (FOR BLOOD PRESSURE SUPPORT)
PREFILLED_SYRINGE | INTRAVENOUS | Status: AC
  Filled 2019-03-09: qty 10

## 2019-03-09 MED ORDER — ONDANSETRON HCL 4 MG/2ML IJ SOLN: 4.0000 mg | Freq: Once | INTRAMUSCULAR | Status: DC | PRN

## 2019-03-09 MED ORDER — BUPIVACAINE-EPINEPHRINE 0.25% -1:200000 IJ SOLN
INTRAMUSCULAR | Status: DC | PRN
  Administered 2019-03-09: 27 mL

## 2019-03-09 MED ORDER — CEFAZOLIN SODIUM-DEXTROSE 2-4 GM/100ML-% IV SOLN
INTRAVENOUS | Status: AC
  Filled 2019-03-09: qty 100

## 2019-03-09 MED ORDER — DEXAMETHASONE SODIUM PHOSPHATE 10 MG/ML IJ SOLN
INTRAMUSCULAR | Status: DC | PRN
  Administered 2019-03-09: 10 mg via INTRAVENOUS

## 2019-03-09 MED ORDER — DEXAMETHASONE SODIUM PHOSPHATE 10 MG/ML IJ SOLN
INTRAMUSCULAR | Status: AC
  Filled 2019-03-09: qty 1

## 2019-03-09 MED ORDER — LACTATED RINGERS IV SOLN
INTRAVENOUS | Status: AC | PRN
  Administered 2019-03-09: 1000 mL

## 2019-03-09 MED ORDER — LACTATED RINGERS IV SOLN
INTRAVENOUS | Status: DC | PRN
  Administered 2019-03-09 (×2): via INTRAVENOUS

## 2019-03-09 MED ORDER — HYDROMORPHONE HCL 1 MG/ML IJ SOLN
0.2500 mg | INTRAMUSCULAR | Status: DC | PRN
  Administered 2019-03-09: 0.5 mg via INTRAVENOUS

## 2019-03-09 MED ORDER — MIDAZOLAM HCL 5 MG/5ML IJ SOLN
INTRAMUSCULAR | Status: DC | PRN
  Administered 2019-03-09: 1 mg via INTRAVENOUS

## 2019-03-09 MED ORDER — ROCURONIUM BROMIDE 10 MG/ML (PF) SYRINGE
PREFILLED_SYRINGE | INTRAVENOUS | Status: DC | PRN
  Administered 2019-03-09: 45 mg via INTRAVENOUS
  Administered 2019-03-09: 5 mg via INTRAVENOUS

## 2019-03-09 MED ORDER — PROPOFOL 10 MG/ML IV BOLUS
INTRAVENOUS | Status: DC | PRN
  Administered 2019-03-09: 130 mg via INTRAVENOUS
  Administered 2019-03-09: 30 mg via INTRAVENOUS

## 2019-03-09 MED ORDER — LABETALOL HCL 5 MG/ML IV SOLN
INTRAVENOUS | Status: DC | PRN
  Administered 2019-03-09 (×2): 5 mg via INTRAVENOUS

## 2019-03-09 MED ORDER — LIDOCAINE 2% (20 MG/ML) 5 ML SYRINGE
INTRAMUSCULAR | Status: DC | PRN
  Administered 2019-03-09: 75 mg via INTRAVENOUS
  Administered 2019-03-09: 25 mg via INTRAVENOUS

## 2019-03-09 MED ORDER — PHENYLEPHRINE HCL (PRESSORS) 10 MG/ML IV SOLN
INTRAVENOUS | Status: DC | PRN
  Administered 2019-03-09: 80 ug via INTRAVENOUS

## 2019-03-09 SURGICAL SUPPLY — 33 items
ADH SKN CLS APL DERMABOND .7 (GAUZE/BANDAGES/DRESSINGS) ×1
APL PRP STRL LF DISP 70% ISPRP (MISCELLANEOUS) ×1
APPLIER CLIP 5 13 M/L LIGAMAX5 (MISCELLANEOUS) ×2
APR CLP MED LRG 5 ANG JAW (MISCELLANEOUS) ×1
BAG SPEC RTRVL LRG 6X4 10 (ENDOMECHANICALS) ×1
CABLE HIGH FREQUENCY MONO STRZ (ELECTRODE) ×2 IMPLANT
CATH REDDICK CHOLANGI 4FR 50CM (CATHETERS) ×2 IMPLANT
CHLORAPREP W/TINT 26 (MISCELLANEOUS) ×2 IMPLANT
CLIP APPLIE 5 13 M/L LIGAMAX5 (MISCELLANEOUS) ×1 IMPLANT
COVER MAYO STAND STRL (DRAPES) ×2 IMPLANT
COVER WAND RF STERILE (DRAPES) IMPLANT
DECANTER SPIKE VIAL GLASS SM (MISCELLANEOUS) ×2 IMPLANT
DERMABOND ADVANCED (GAUZE/BANDAGES/DRESSINGS) ×1
DERMABOND ADVANCED .7 DNX12 (GAUZE/BANDAGES/DRESSINGS) ×1 IMPLANT
DRAPE C-ARM 42X120 X-RAY (DRAPES) ×2 IMPLANT
ELECT REM PT RETURN 15FT ADLT (MISCELLANEOUS) ×2 IMPLANT
GLOVE BIO SURGEON STRL SZ7.5 (GLOVE) ×2 IMPLANT
GOWN STRL REUS W/TWL XL LVL3 (GOWN DISPOSABLE) ×6 IMPLANT
HEMOSTAT SURGICEL 4X8 (HEMOSTASIS) IMPLANT
IV CATH 14GX2 1/4 (CATHETERS) ×2 IMPLANT
KIT BASIN OR (CUSTOM PROCEDURE TRAY) ×2 IMPLANT
KIT TURNOVER KIT A (KITS) IMPLANT
POUCH SPECIMEN RETRIEVAL 10MM (ENDOMECHANICALS) ×2 IMPLANT
SCISSORS LAP 5X35 DISP (ENDOMECHANICALS) ×2 IMPLANT
SET IRRIG TUBING LAPAROSCOPIC (IRRIGATION / IRRIGATOR) ×2 IMPLANT
SET TUBE SMOKE EVAC HIGH FLOW (TUBING) ×2 IMPLANT
SLEEVE XCEL OPT CAN 5 100 (ENDOMECHANICALS) ×4 IMPLANT
SUT MNCRL AB 4-0 PS2 18 (SUTURE) ×2 IMPLANT
TOWEL OR 17X26 10 PK STRL BLUE (TOWEL DISPOSABLE) ×2 IMPLANT
TOWEL OR NON WOVEN STRL DISP B (DISPOSABLE) ×2 IMPLANT
TRAY LAPAROSCOPIC (CUSTOM PROCEDURE TRAY) ×2 IMPLANT
TROCAR BLADELESS OPT 5 100 (ENDOMECHANICALS) ×2 IMPLANT
TROCAR XCEL BLUNT TIP 100MML (ENDOMECHANICALS) ×2 IMPLANT

## 2019-03-09 NOTE — Progress Notes (Signed)
   Chart review indicates she did well after ERCP  I will f/u polyp path and coordinate w/ Dr. Therisa Doyne re: GI f/u  Gatha Mayer, MD, [facility] 03/09/2019 6:29 PM Pager [phone]

## 2019-03-09 NOTE — Op Note (Addendum)
03/06/2019 - 03/09/2019  10:31 AM  PATIENT:  Kendra Miles  68 y.o. female  PRE-OPERATIVE DIAGNOSIS:  gallstones  POST-OPERATIVE DIAGNOSIS:  gallstones  PROCEDURE:  Procedure(s): laparoscopic cholecystectomy (N/A)  SURGEON:  Surgeon(s) and Role:    * Jovita Kussmaul, MD - Primary  PHYSICIAN ASSISTANT:   ASSISTANTS: Dr. Marcello Moores   ANESTHESIA:   local and general  EBL:  5 mL   BLOOD ADMINISTERED:none  DRAINS: none   LOCAL MEDICATIONS USED:  MARCAINE     SPECIMEN:  Source of Specimen:  gallbladder  DISPOSITION OF SPECIMEN:  PATHOLOGY  COUNTS:  YES  TOURNIQUET:  * No tourniquets in log *  DICTATION: .Dragon Dictation    Procedure: After informed consent was obtained the patient was brought to the operating room and placed in the supine position on the operating room table. After adequate induction of general anesthesia the patient's abdomen was prepped with ChloraPrep allowed to dry and draped in usual sterile manner. An appropriate timeout was performed. The area below the umbilicus was infiltrated with quarter percent  Marcaine. A small incision was made with a 15 blade knife. The incision was carried down through the subcutaneous tissue bluntly with a hemostat and [employer] retractors. The linea alba was identified. The linea alba was incised with a 15 blade knife and each side was grasped with Coker clamps. The preperitoneal space was then probed with a hemostat until the peritoneum was opened and access was gained to the abdominal cavity. A 0 Vicryl pursestring stitch was placed in the fascia surrounding the opening. A Hassan cannula was then placed through the opening and anchored in place with the previously placed Vicryl purse string stitch. The abdomen was insufflated with carbon dioxide without difficulty. A laparoscope was inserted through the [facility] cannula in the right upper quadrant was inspected. Next the epigastric region was infiltrated with % Marcaine. A small  incision was made with a 15 blade knife. A 5 mm port was placed bluntly through this incision into the abdominal cavity under direct vision. Next 2 sites were chosen laterally on the right side of the abdomen for placement of 5 mm ports. Each of these areas was infiltrated with quarter percent Marcaine. Small stab incisions were made with a 15 blade knife. 5 mm ports were then placed bluntly through these incisions into the abdominal cavity under direct vision without difficulty. A blunt grasper was placed through the lateralmost 5 mm port and used to grasp the dome of the gallbladder and elevated anteriorly and superiorly. Another blunt grasper was placed through the other 5 mm port and used to retract the body and neck of the gallbladder. A dissector was placed through the epigastric port and using the electrocautery the peritoneal reflection at the gallbladder neck was opened. Blunt dissection was then carried out in this area until the gallbladder neck-cystic duct junction was readily identified and a good critical window was created. 2 clips were placed proximally on the cystic duct and one distally  and the duct was divided between the 2 sets of clips. Posterior to this the cystic artery was identified and again dissected bluntly in a circumferential manner until a good window  was created. 2 clips were placed proximally and one distally on the artery and the artery was divided between the 2 sets of clips. Next a laparoscopic hook cautery device was used to separate the gallbladder from the liver bed. Prior to completely detaching the gallbladder from the liver bed the liver bed was  inspected and several small bleeding points were coagulated with the electrocautery until the area was completely hemostatic. The gallbladder was then detached the rest of it from the liver bed without difficulty. A laparoscopic bag was inserted through the hassan port. The laparoscope was moved to the epigastric port. The  gallbladder was placed within the bag and the bag was sealed.  The bag with the gallbladder was then removed with the [facility] cannula through the infraumbilical port without difficulty. The fascial defect was then closed with the previously placed Vicryl pursestring stitch as well as with another figure-of-eight 0 Vicryl stitch. The liver bed was inspected again and found to be hemostatic. The abdomen was irrigated with copious amounts of saline until the effluent was clear. The ports were then removed under direct vision without difficulty and were found to be hemostatic. The gas was allowed to escape. The skin incisions were all closed with interrupted 4-0 Monocryl subcuticular stitches. Dermabond dressings were applied. The patient tolerated the procedure well. At the end of the case all needle sponge and instrument counts were correct. The patient was then awakened and taken to recovery in stable condition. The assistant was instrumental for retraction and visualization during the case  PLAN OF CARE: Admit to inpatient   PATIENT DISPOSITION:  PACU - hemodynamically stable.   Delay start of Pharmacological VTE agent (>24hrs) due to surgical blood loss or risk of bleeding: no

## 2019-03-09 NOTE — Anesthesia Procedure Notes (Signed)
Procedure Name: Intubation Date/Time: 03/09/2019 9:44 AM Performed by: Lissa Morales, CRNA Pre-anesthesia Checklist: Patient identified, Emergency Drugs available, Suction available and Patient being monitored Patient Re-evaluated:Patient Re-evaluated prior to induction Oxygen Delivery Method: Circle system utilized Preoxygenation: Pre-oxygenation with 100% oxygen Induction Type: IV induction Ventilation: Mask ventilation without difficulty Laryngoscope Size: Glidescope and 3 Grade View: Grade II Tube type: Oral Tube size: 6.0 mm Number of attempts: 1 Airway Equipment and Method: Stylet and Oral airway Placement Confirmation: ETT inserted through vocal cords under direct vision,  positive ETCO2 and breath sounds checked- equal and bilateral Secured at: 21 cm Tube secured with: Tape Dental Injury: Teeth and Oropharynx as per pre-operative assessment

## 2019-03-09 NOTE — Anesthesia Postprocedure Evaluation (Signed)
Anesthesia Post Note  Patient: Kendra Miles  Procedure(s) Performed: laparoscopic cholecystectomy (N/A Abdomen)     Patient location during evaluation: PACU Anesthesia Type: General Level of consciousness: sedated and patient cooperative Pain management: pain level controlled Vital Signs Assessment: post-procedure vital signs reviewed and stable Respiratory status: spontaneous breathing Cardiovascular status: stable Anesthetic complications: no    Last Vitals:  Vitals:   03/09/19 1148 03/09/19 1346  BP: 121/80 112/61  Pulse:  91  Resp:  16  Temp: 36.7 C 36.6 C  SpO2:  94%    Last Pain:  Vitals:   03/09/19 1346  TempSrc: Oral  PainSc:                  Nolon Nations

## 2019-03-09 NOTE — Transfer of Care (Signed)
Immediate Anesthesia Transfer of Care Note  Patient: Kendra Miles  Procedure(s) Performed: laparoscopic cholecystectomy (N/A Abdomen)  Patient Location: PACU  Anesthesia Type:General  Level of Consciousness: awake, alert , oriented and patient cooperative  Airway & Oxygen Therapy: Patient Spontanous Breathing and Patient connected to face mask oxygen  Post-op Assessment: Report given to RN, Post -op Vital signs reviewed and stable and Patient moving all extremities X 4  Post vital signs: stable  Last Vitals:  Vitals Value Taken Time  BP 144/85 03/09/19 1045  Temp    Pulse 77 03/09/19 1048  Resp 12 03/09/19 1048  SpO2 99 % 03/09/19 1048  Vitals shown include unvalidated device data.  Last Pain:  Vitals:   03/09/19 0437  TempSrc: Oral  PainSc:          Complications: No apparent anesthesia complications

## 2019-03-09 NOTE — Progress Notes (Signed)
PROGRESS NOTE    Katiya Fike  ZHY:[id number] DOB: [birth year] DOA: 03/06/2019 PCP: Simona Huh, NP   Brief Narrative:  HPI per Dr. Lesia Sago on 03/06/2019 Kendra Miles is a 68 y.o. female with medical history significant for hypertension, hyperlipidemia, GERD, DM, hiatal hernia presents to the ED complaining of chest discomfort for for the past 1 day.  Patient reported pain more in the epigastric region and some radiation to the back.  Reports the pain woke her up around 2 AM, was a 9/10, sharp pain.  Today patient went to see her primary care, who noticed changes on her EKG and sent her to the ED.  Patient currently rates her pain as a 4/10, denies any nausea/vomiting, diarrhea, fever/chills, cough, shortness of breath, dizziness.  No sick contacts.  ED Course: In the ED, patient vital signs stable, labs showed alkaline phosphatase elevated at 215, AST 662, ALT 457, T bili 1.6.  Troponin levels were less than and repeat was 10.  EKG showed normal sinus rhythm, with no acute ST changes.  Limited right upper quadrant ultrasound showed common bile duct measuring about 6.6 mm, with several small stones in the gallbladder, recommended an MRCP.  EDP consulted GI Dr. Penelope Coop, who recommended an MRCP and will see patient tomorrow.  Hospitalist consulted for admission.  **Interim History Gastroenterology was consulted and they took the patient for an ERCP on 03/08/2019 and there was choledocholithiasis without cholecystitis found and there is complete stone removal was accomplished by biliary sphincterotomy and balloon extraction.  Lucency in the end of the film was felt to be air or artifact and there is also a single duodenal polyp which was biopsied.  Per report there is a slight of the blood after sphincterotomy that was stopped after a stent placement to reduce the risk of postoperative ERCP pancreatitis.  Gastroenterology also recommends general surgery consultation for cholecystectomy and this  is going to be done this AM.   Assessment & Plan:   Principal Problem:   Choledocholithiasis with obstruction Active Problems:   HTN (hypertension)   DM (diabetes mellitus) ([place])   GERD (gastroesophageal reflux disease)   Polyp of stomach and duodenum  Symptomatic Cholelithiasis s/p Laparoscopic Cholecystectomy done by Dr. Marlou Starks POD0 Choledocholithiasis without obstruction or Cholecystitis s/p ERCP and removal   Abnormal LFT's, trending down  Hyperbilirubinemia -Afebrile, no leukocytosis (9.4) -Elevated alkaline phosphatase at 215, AST 662, ALT 457 on Admission -Now Alk Phos is 254, AST is 91, and ALT is 165 ? 2/2 to Passing of Stone  -T Bili went from 1.3 -> 2.9 in the setting of ERCP -Hepatitis panel negative  -Troponin negative, EKG with no acute ST changes -Limited right upper quadrant ultrasound showed common bile duct measuring about 6.6 mm, with several small stones in the gallbladder, recommended an MRCP -MRCP done and showed "Cholelithiasis. Extrahepatic common duct measures upper normal for patient age. There is a tiny 4 mm stone in the distal common bile duct.  1.1 cm well-defined mildly heterogeneous lesion in the upper spleen. This cannot be completely characterized due to substantial motion degradation on the postcontrast imaging. Features may be related to a complex cyst/pseudocyst, potentially with rim calcification." -Gastroenterology was consulted and Dr. Therisa Doyne evaluated the patient yesterday and recommends ERCP; ERCP done by Dr. Carlean Purl and showed choledocholithiasis without cholecystitis found and there is complete stone removal was accomplished by biliary sphincterotomy and balloon extraction.  Lucency in the end of the film was felt to be air or artifact and there  is also a single duodenal polyp which was biopsied.  Per report there is a slight of the blood after sphincterotomy that was stopped after a stent placement to reduce the risk of postoperative ERCP  pancreatitis.    -General surgery was also consulted for evaluation for cholecystectomy and will defer timing of this to them; They are planning on doing Surgery today and she currently had it today  -In the interim patient received IV fluids with 1 L normal saline at a rate of 75 mL's per hour (now stopped) and we will continue pain control with 2 mg of IV morphine every 4 PRN for severe pain as well as antiemetics with 4 mg p.o./IV of Zofran every 6 hours as needed for nausea -Also will continue tramadol 50 mg p.o. every 6 hours as needed for moderate pain -Preoperative antibiotic started with ciprofloxacin by general surgery -Continue to monitor and trend hepatic function panel and repeat CBC and CMP in a.m.  Hypertension -Continue PTA lisinopril 2.5 mg po Daily  -BP this AM was 144/85  Diabetes Mellitus Type 2 -HbA1c was 7.5 -C/w Sensitive SSI, Accu-Cheks, Hypoglycemic protocol -Hold Home Glipizide -CBG's ranging from 158-263; Blood Sugar on CMP this AM was 165 -Continue to Monitor and adjust Insulin Scale as Needed   Hyperlipidemia -C/w Rosuvastatin 10 mg po Daily   GERD/Acid Reflux -Continue PPI with Pantoprazole 40 mg po Daily   Obesity -Estimated body mass index is 32.81 kg/m as calculated from the following:   Height as of this encounter: '5\' 6"'  (1.676 m).   Weight as of this encounter: 92.2 kg. -Weight Loss and Dietary Counseling given   Erythrocytosis -Patient's hemoglobin/hematocrit went from 14.6/44.1-> 16.1/49.1; Repeat this AM was 13.5/41.5 and last result likely Spurious  -Continue to monitor and will consider restarting IV fluids if consistently remaining elevated -Repeat CBC in a.m.  Leukocytosis -WBC went from 8.9 to 16.4 and likely reactive to ERCP yesterday -Expect to Trend up a little bit after Surgery  -Given Preoperative Abx with Ciprofloxacin yesterday -Continue to Monitor for S/Sx of Infection -Currently she is afebrile -Repeat CBC in AM   DVT  prophylaxis: Enoxaparin 40 mg sq q24h Code Status: FULL CODE  Family Communication: No family present at bedside  Disposition Plan: Pending Further GI and Surgical Workup   Consultants:   Gastroenterology   General Surgery    Procedures: MRCP  ERCP  Findings:      The scout film was normal. Esophagus NL as best I could tell w/       duodenoscope. Large hiatal hernia, stomach otherwise normal. Small major       papilla - difficult to get underneath. wire into - injected and CBD and       PD filled. Left a wire in pancreatic duct and then cannulated CBD.       Injections showed small dstal CBD stone, mildly dilated bilioary tree       and gallbladder w./ stones. Pancreatic duct was NL. Biliary       sphincterotomy w/ wire in CBD - difficult - some char and edema - hard       to get position to limit wire contact. Was able to get that done and       balloon sweep removed stone (multiple sweeps0 with negative occlusion       cholangioogram. Papilla was oozing blood slowly and edematous. 4 Fr 3 cm       pigtail stent placed into pancreatic duct. Bleeding  stopped with this so       suspect that applied peressure to the oozing site.      Then I saw a 10-12 mm lateral wall duodenal polyp - sessile - biopsied x       4. Scope out. On end film there is a slight lucency in distal CBD that       is air or other artifact I think. Impression:               - Choledocholithiasis without cholecystitis was                            found. Complete stone removal was accomplished by                            biliary sphincterotomy and balloon extraction.                            Lucency on end film I think air or other artifact.                           - A single duodenal polyp. Biopsied.                           Slight ooze of blood after sphincterotomy - this                            stopped after I placed 4 Fr 3 cm pigtail PD stent                            to reduce risk of post-ERCP  pancreatitis   Antimicrobials:  Anti-infectives (From admission, onward)   Start     Dose/Rate Route Frequency Ordered Stop   03/09/19 0747  ceFAZolin (ANCEF) 2-4 GM/100ML-% IVPB  Status:  Discontinued    Note to Pharmacy: Marquis Buggy   : cabinet override      03/09/19 0747 03/09/19 1048   03/08/19 0800  [MAR Hold]  ciprofloxacin (CIPRO) IVPB 400 mg     (MAR Hold since Sun 03/09/2019 at 1010.Hold Reason: Transfer to a Procedural area.)   400 mg 200 mL/hr over 60 Minutes Intravenous Every 12 hours 03/08/19 0716       Subjective: Seen and examined at bedside after Surgery and she states that she was feeling a little bit sleepy and that that she was sore.  Denied nausea or vomiting.  Was wanting to rest.  No other concerns or complaints at this time and surgery has advance diet to a clear liquid diet now.  Objective: Vitals:   03/08/19 2045 03/09/19 0008 03/09/19 0437 03/09/19 1030  BP: 112/68 102/64 97/62 (!) 144/85  Pulse: 83 75 71 78  Resp: '19 17 20 15  ' Temp: 98.1 F (36.7 C) 98.2 F (36.8 C) 97.9 F (36.6 C) 98.1 F (36.7 C)  TempSrc: Oral Oral Oral   SpO2: 93% 91% 91% 98%  Weight:      Height:        Intake/Output Summary (Last 24 hours) at 03/09/2019 1054 Last data filed at 03/09/2019 1043 Gross per 24 hour  Intake 1655.11 ml  Output 5 ml  Net  1650.11 ml   Filed Weights   03/06/19 1043 03/06/19 1843 03/08/19 0710  Weight: 93 kg 92.2 kg 92.2 kg   Examination: Physical Exam:  Constitutional: Well-nourished, well-developed obese Caucasian female currently no acute distress and is resting after her surgical procedure Eyes: Lids and conjunctive are normal.  Sclera anicteric ENMT: External ears and nose appear normal.  Grossly normal hearing Neck: Appears supple no JVD Respiratory: Slightly diminished auscultation bilaterally no appreciable wheezing, rales or rhonchi.  Patient not tachypneic or using any accessory muscles of breathing she is wearing supplemental  oxygen via nasal cannula as she had come back from her surgical intervention Cardiovascular: Regular rate and rhythm.  Has no appreciable murmurs, rubs, gallops Abdomen: Soft, mildly tender to palpation, distended secondary body habitus.  Bowel sounds present.  Abdominal incisions appear clean dry intact GU: Deferred Musculoskeletal: No contractures or cyanosis.  No joint deformities in the upper and lower extremities Skin: Skin is warm and dry no appreciable rashes or lesions on skin evaluation Neurologic: Cranial nerves II through XII grossly intact no appreciable focal deficits.  Romberg sign cerebellar reflexes were not assessed Psychiatric: Has a normal mood and affect..  Intact judgment insight.  She is a little drowsy and wanting to sleep after she had just come back from her surgical intervention  Data Reviewed: I have personally reviewed following labs and imaging studies  CBC: Recent Labs  Lab 03/06/19 1105 03/07/19 0558 03/08/19 0656 03/09/19 0619  WBC 8.6 9.4 8.9 16.4*  NEUTROABS 6.5  --  5.6  --   HGB 14.9 14.6 16.1* 13.5  HCT 44.9 44.1 49.1* 41.5  MCV 91.4 92.3 93.7 92.2  PLT 271 310 301 846   Basic Metabolic Panel: Recent Labs  Lab 03/06/19 1105 03/07/19 0558 03/08/19 0656 03/09/19 0619  NA 142 141 141 140  K 3.9 3.9 3.6 4.0  CL 104 105 103 103  CO2 '28 27 28 28  ' GLUCOSE 123* 165* 136* 171*  BUN '13 11 10 17  ' CREATININE 0.75 0.71 0.87 0.90  CALCIUM 9.3 8.7* 9.0 8.9  MG  --   --  2.2 2.3  PHOS  --   --  2.9 3.2   GFR: Estimated Creatinine Clearance: 71.3 mL/min (by C-G formula based on SCr of 0.9 mg/dL). Liver Function Tests: Recent Labs  Lab 03/06/19 1105 03/07/19 0558 03/08/19 0656 03/09/19 0619  AST 662* 335* 82* 91*  ALT 457* 366* 229* 165*  ALKPHOS 215* 282* 283* 254*  BILITOT 1.6* 2.7* 1.3* 2.9*  PROT 6.7 6.2* 7.1 5.8*  ALBUMIN 4.0 3.6 4.0 3.3*   Recent Labs  Lab 03/06/19 1105 03/09/19 0619  LIPASE 32 28  AMYLASE  --  39   No results  for input(s): AMMONIA in the last 168 hours. Coagulation Profile: Recent Labs  Lab 03/06/19 1105  INR 1.0   Cardiac Enzymes: No results for input(s): CKTOTAL, CKMB, CKMBINDEX, TROPONINI in the last 168 hours. BNP (last 3 results) No results for input(s): PROBNP in the last 8760 hours. HbA1C: Recent Labs    03/07/19 0558  HGBA1C 7.5*   CBG: Recent Labs  Lab 03/08/19 1119 03/08/19 1647 03/08/19 2048 03/09/19 0757 03/09/19 1050  GLUCAP 218* 263* 228* 158* 167*   Lipid Profile: No results for input(s): CHOL, HDL, LDLCALC, TRIG, CHOLHDL, LDLDIRECT in the last 72 hours. Thyroid Function Tests: No results for input(s): TSH, T4TOTAL, FREET4, T3FREE, THYROIDAB in the last 72 hours. Anemia Panel: No results for input(s): VITAMINB12, FOLATE, FERRITIN, TIBC,  IRON, RETICCTPCT in the last 72 hours. Sepsis Labs: No results for input(s): PROCALCITON, LATICACIDVEN in the last 168 hours.  Recent Results (from the past 240 hour(s))  SARS Coronavirus 2 (CEPHEID - Performed in [place] hospital lab), Hosp Order     Status: None   Collection Time: 03/06/19  2:36 PM   Specimen: Nasopharyngeal Swab  Result Value Ref Range Status   SARS Coronavirus 2 NEGATIVE NEGATIVE Final    Comment: (NOTE) If result is NEGATIVE SARS-CoV-2 target nucleic acids are NOT DETECTED. The SARS-CoV-2 RNA is generally detectable in upper and lower  respiratory specimens during the acute phase of infection. The lowest  concentration of SARS-CoV-2 viral copies this assay can detect is 250  copies / mL. A negative result does not preclude SARS-CoV-2 infection  and should not be used as the sole basis for treatment or other  patient management decisions.  A negative result may occur with  improper specimen collection / handling, submission of specimen other  than nasopharyngeal swab, presence of viral mutation(s) within the  areas targeted by this assay, and inadequate number of viral copies  (<250 copies / mL).  A negative result must be combined with clinical  observations, patient history, and epidemiological information. If result is POSITIVE SARS-CoV-2 target nucleic acids are DETECTED. The SARS-CoV-2 RNA is generally detectable in upper and lower  respiratory specimens dur ing the acute phase of infection.  Positive  results are indicative of active infection with SARS-CoV-2.  Clinical  correlation with patient history and other diagnostic information is  necessary to determine patient infection status.  Positive results do  not rule out bacterial infection or co-infection with other viruses. If result is PRESUMPTIVE POSTIVE SARS-CoV-2 nucleic acids MAY BE PRESENT.   A presumptive positive result was obtained on the submitted specimen  and confirmed on repeat testing.  While 2019 novel coronavirus  (SARS-CoV-2) nucleic acids may be present in the submitted sample  additional confirmatory testing may be necessary for epidemiological  and / or clinical management purposes  to differentiate between  SARS-CoV-2 and other Sarbecovirus currently known to infect humans.  If clinically indicated additional testing with an alternate test  methodology [id number]) is advised. The SARS-CoV-2 RNA is generally  detectable in upper and lower respiratory sp ecimens during the acute  phase of infection. The expected result is Negative. Fact Sheet for Patients:  StrictlyIdeas.no Fact Sheet for Healthcare Providers: BankingDealers.co.za This test is not yet approved or cleared by the [place] FDA and has been authorized for detection and/or diagnosis of SARS-CoV-2 by FDA under an Emergency Use Authorization (EUA).  This EUA will remain in effect (meaning this test can be used) for the duration of the COVID-19 declaration under Section 564(b)(1) of the Act, 21 U.S.C. section 360bbb-3(b)(1), unless the authorization is terminated or revoked  sooner. Performed at [facility], [place] [address], [place], [place] [address]     Radiology Studies: Dg Ercp Biliary & Pancreatic Ducts  Result Date: 03/08/2019 CLINICAL DATA:  Cholelithiasis with suspected choledocholithiasis on MRCP EXAM: ERCP TECHNIQUE: Multiple spot images obtained with the fluoroscopic device and submitted for interpretation post-procedure. COMPARISON:  MRCP 03/06/2019 FINDINGS: A series of fluoroscopic spot images document endoscopic cannulation of the CBD. Multiple filling defects in the distal CBD on initial images. Subsequent balloon catheter passage. Cystic duct is patent. Limited opacification of visualization of intrahepatic biliary tree, which appears relatively decompressed centrally. No extravasation. IMPRESSION: Probable choledocholithiasis, with endoscopic CBD cannulation and intervention.  These images were submitted for radiologic interpretation only. Please see the procedural report for the amount of contrast and the fluoroscopy time utilized. Electronically Signed   By: Lucrezia Europe M.D.   On: 03/08/2019 10:04   Scheduled Meds:  [MAR Hold] aspirin EC  81 mg Oral Daily   [MAR Hold] cholecalciferol  1,000 Units Oral Daily   [MAR Hold] enoxaparin (LOVENOX) injection  40 mg Subcutaneous Q24H   [MAR Hold] insulin aspart  0-9 Units Subcutaneous TID WC   [MAR Hold] lisinopril  2.5 mg Oral Daily   [MAR Hold] multivitamin with minerals  1 tablet Oral Daily   [MAR Hold] omega-3 acid ethyl esters  1 g Oral Daily   [MAR Hold] pantoprazole  40 mg Oral Daily   [MAR Hold] rosuvastatin  10 mg Oral Daily   Continuous Infusions:  [MAR Hold] sodium chloride Stopped (03/08/19 2342)   [MAR Hold] ciprofloxacin 400 mg (03/09/19 0809)    LOS: 3 days   Kerney Elbe, DO [facility] PAGER is on [place]  If 7PM-7AM, please contact night-coverage [email or web address] Password [facility] 03/09/2019, 10:54 AM

## 2019-03-09 NOTE — Anesthesia Preprocedure Evaluation (Signed)
Anesthesia Evaluation  Patient identified by MRN, date of birth, ID band Patient awake    Reviewed: Allergy & Precautions, NPO status , Patient's Chart, lab work & pertinent test results  Airway Mallampati: III  TM Distance: >3 FB Neck ROM: Full    Dental  (+) Dental Advisory Given, Teeth Intact, Caps   Pulmonary neg pulmonary ROS,    Pulmonary exam normal breath sounds clear to auscultation       Cardiovascular hypertension, Pt. on medications Normal cardiovascular exam Rhythm:Regular Rate:Normal     Neuro/Psych negative neurological ROS  negative psych ROS   GI/Hepatic Neg liver ROS, hiatal hernia, GERD  ,  Endo/Other  diabetes, Type 2  Renal/GU negative Renal ROS     Musculoskeletal negative musculoskeletal ROS (+)   Abdominal (+) + obese,   Peds  Hematology negative hematology ROS (+)   Anesthesia Other Findings   Reproductive/Obstetrics negative OB ROS                             Anesthesia Physical  Anesthesia Plan  ASA: III  Anesthesia Plan: General   Post-op Pain Management:    Induction: Intravenous, Rapid sequence and Cricoid pressure planned  PONV Risk Score and Plan: 3 and Ondansetron, Dexamethasone, Midazolam and Treatment may vary due to age or medical condition  Airway Management Planned: Oral ETT and Video Laryngoscope Planned  Additional Equipment: None  Intra-op Plan:   Post-operative Plan: Extubation in OR  Informed Consent: I have reviewed the patients History and Physical, chart, labs and discussed the procedure including the risks, benefits and alternatives for the proposed anesthesia with the patient or authorized representative who has indicated his/her understanding and acceptance.     Dental advisory given  Plan Discussed with: CRNA  Anesthesia Plan Comments:         Anesthesia Quick Evaluation

## 2019-03-09 NOTE — Progress Notes (Signed)
1 Day Post-Op   Subjective/Chief Complaint: No complaints   Objective: Vital signs in last 24 hours: Temp:  [97.9 F (36.6 C)-98.4 F (36.9 C)] 97.9 F (36.6 C) (07/19 0437) Pulse Rate:  [71-100] 71 (07/19 0437) Resp:  [14-20] 20 (07/19 0437) BP: (97-162)/(62-105) 97/62 (07/19 0437) SpO2:  [91 %-97 %] 91 % (07/19 0437) Last BM Date: 03/07/19  Intake/Output from previous day: 07/18 0701 - 07/19 0700 In: 1455.1 [P.O.:240; I.V.:815.1; IV Piggyback:400] Out: 15 [Blood:15] Intake/Output this shift: No intake/output data recorded.  General appearance: alert and cooperative Resp: clear to auscultation bilaterally Cardio: regular rate and rhythm GI: soft, minimal tenderness  Lab Results:  Recent Labs    03/08/19 0656 03/09/19 0619  WBC 8.9 16.4*  HGB 16.1* 13.5  HCT 49.1* 41.5  PLT 301 306   BMET Recent Labs    03/07/19 0558 03/08/19 0656  NA 141 141  K 3.9 3.6  CL 105 103  CO2 27 28  GLUCOSE 165* 136*  BUN 11 10  CREATININE 0.71 0.87  CALCIUM 8.7* 9.0   PT/INR Recent Labs    03/06/19 1105  LABPROT 12.7  INR 1.0   ABG No results for input(s): PHART, HCO3 in the last 72 hours.  Invalid input(s): PCO2, PO2  Studies/Results: Dg Ercp Biliary & Pancreatic Ducts  Result Date: 03/08/2019 CLINICAL DATA:  Cholelithiasis with suspected choledocholithiasis on MRCP EXAM: ERCP TECHNIQUE: Multiple spot images obtained with the fluoroscopic device and submitted for interpretation post-procedure. COMPARISON:  MRCP 03/06/2019 FINDINGS: A series of fluoroscopic spot images document endoscopic cannulation of the CBD. Multiple filling defects in the distal CBD on initial images. Subsequent balloon catheter passage. Cystic duct is patent. Limited opacification of visualization of intrahepatic biliary tree, which appears relatively decompressed centrally. No extravasation. IMPRESSION: Probable choledocholithiasis, with endoscopic CBD cannulation and intervention. These images  were submitted for radiologic interpretation only. Please see the procedural report for the amount of contrast and the fluoroscopy time utilized. Electronically Signed   By: Lucrezia Europe M.D.   On: 03/08/2019 10:04    Anti-infectives: Anti-infectives (From admission, onward)   Start     Dose/Rate Route Frequency Ordered Stop   03/08/19 0800  ciprofloxacin (CIPRO) IVPB 400 mg     400 mg 200 mL/hr over 60 Minutes Intravenous Every 12 hours 03/08/19 0716        Assessment/Plan: s/p Procedure(s): ENDOSCOPIC RETROGRADE CHOLANGIOPANCREATOGRAPHY (ERCP) (N/A) SPHINCTEROTOMY REMOVAL OF STONES BIOPSY PANCREATIC STENT PLACEMENT Plan for lap chole today. Risks and benefits of the surgery as well as some of the technical aspects including cbd injury discussed with patient and she understands and wishes to proceed  LOS: 3 days    Autumn Messing III 03/09/2019

## 2019-03-10 ENCOUNTER — Encounter (HOSPITAL_COMMUNITY): Payer: Self-pay | Admitting: General Surgery

## 2019-03-10 LAB — COMPREHENSIVE METABOLIC PANEL
ALT: 152 U/L — ABNORMAL HIGH (ref 0–44)
AST: 52 U/L — ABNORMAL HIGH (ref 15–41)
Albumin: 3.4 g/dL — ABNORMAL LOW (ref 3.5–5.0)
Alkaline Phosphatase: 258 U/L — ABNORMAL HIGH (ref 38–126)
Anion gap: 11 (ref 5–15)
BUN: 15 mg/dL (ref 8–23)
CO2: 28 mmol/L (ref 22–32)
Calcium: 8.8 mg/dL — ABNORMAL LOW (ref 8.9–10.3)
Chloride: 100 mmol/L (ref 98–111)
Creatinine, Ser: 0.85 mg/dL (ref 0.44–1.00)
GFR calc Af Amer: >60 mL/min (ref >60)
GFR calc non Af Amer: >60 mL/min (ref >60)
Glucose, Bld: 202 mg/dL — ABNORMAL HIGH (ref 70–99)
Potassium: 4.2 mmol/L (ref 3.5–5.1)
Sodium: 139 mmol/L (ref 135–145)
Total Bilirubin: 0.9 mg/dL (ref 0.3–1.2)
Total Protein: 6.3 g/dL — ABNORMAL LOW (ref 6.5–8.1)

## 2019-03-10 LAB — CBC WITH DIFFERENTIAL/PLATELET
Abs Immature Granulocytes: 0.09 10*3/uL — ABNORMAL HIGH (ref 0.00–0.07)
Basophils Absolute: 0 10*3/uL (ref 0.0–0.1)
Basophils Relative: 0 %
Eosinophils Absolute: 0 10*3/uL (ref 0.0–0.5)
Eosinophils Relative: 0 %
HCT: 42.2 % (ref 36.0–46.0)
Hemoglobin: 13.3 g/dL (ref 12.0–15.0)
Immature Granulocytes: 1 %
Lymphocytes Relative: 7 %
Lymphs Abs: 1.3 10*3/uL (ref 0.7–4.0)
MCH: 30.2 pg (ref 26.0–34.0)
MCHC: 31.5 g/dL (ref 30.0–36.0)
MCV: 95.7 fL (ref 80.0–100.0)
Monocytes Absolute: 1.1 10*3/uL — ABNORMAL HIGH (ref 0.1–1.0)
Monocytes Relative: 6 %
Neutro Abs: 14.7 10*3/uL — ABNORMAL HIGH (ref 1.7–7.7)
Neutrophils Relative %: 86 %
Platelets: 298 10*3/uL (ref 150–400)
RBC: 4.41 MIL/uL (ref 3.87–5.11)
RDW: 14 % (ref 11.5–15.5)
WBC: 17.2 10*3/uL — ABNORMAL HIGH (ref 4.0–10.5)
nRBC: 0 % (ref 0.0–0.2)

## 2019-03-10 LAB — GLUCOSE, CAPILLARY
Glucose-Capillary: 137 mg/dL — ABNORMAL HIGH (ref 70–99)
Glucose-Capillary: 161 mg/dL — ABNORMAL HIGH (ref 70–99)
Glucose-Capillary: 191 mg/dL — ABNORMAL HIGH (ref 70–99)

## 2019-03-10 LAB — PHOSPHORUS: Phosphorus: 3.6 mg/dL (ref 2.5–4.6)

## 2019-03-10 LAB — MAGNESIUM: Magnesium: 2.2 mg/dL (ref 1.7–2.4)

## 2019-03-10 MED ORDER — ONDANSETRON HCL 4 MG PO TABS: 4.0000 mg | ORAL_TABLET | Freq: Four times a day (QID) | ORAL | 0 refills | Status: DC | PRN

## 2019-03-10 MED ORDER — POLYETHYLENE GLYCOL 3350 17 G PO PACK: 17.0000 g | PACK | Freq: Every day | ORAL | 0 refills | Status: DC | PRN

## 2019-03-10 MED ORDER — HYDROCODONE-ACETAMINOPHEN 5-325 MG PO TABS: 1.0000 | ORAL_TABLET | Freq: Four times a day (QID) | ORAL | 0 refills | Status: DC | PRN

## 2019-03-10 NOTE — Progress Notes (Signed)
[facility] Progress Note  1 Day Post-Op  Subjective: CC-  Feeling well today. Mild abdominal soreness. Denies n/v. Tolerating liquids. Mobilizing without issues.  Tbili trending down 0.9.  Objective: Vital signs in last 24 hours: Temp:  [97.8 F (36.6 C)-98.1 F (36.7 C)] 97.8 F (36.6 C) (07/20 0519) Pulse Rate:  [76-91] 76 (07/20 0519) Resp:  [12-21] 19 (07/20 0519) BP: (112-144)/(61-90) 113/61 (07/20 0519) SpO2:  [90 %-98 %] 90 % (07/20 0519) Last BM Date: 03/08/19  Intake/Output from previous day: 07/19 0701 - 07/20 0700 In: 1397.4 [P.O.:120; I.V.:1006.6; IV Piggyback:270.8] Out: 905 [Urine:900; Blood:5] Intake/Output this shift: No intake/output data recorded.  PE: Gen:  Alert, NAD, pleasant HEENT: EOM's intact, pupils equal and round Pulm:  Rate and effort normal Abd: Soft, NT/ND, +BS, lap incisions cdi Skin: warm and dry  Lab Results:  Recent Labs    03/09/19 0619 03/10/19 0607  WBC 16.4* 17.2*  HGB 13.5 13.3  HCT 41.5 42.2  PLT 306 298   BMET Recent Labs    03/09/19 0619 03/10/19 0607  NA 140 139  K 4.0 4.2  CL 103 100  CO2 28 28  GLUCOSE 171* 202*  BUN 17 15  CREATININE 0.90 0.85  CALCIUM 8.9 8.8*   PT/INR No results for input(s): LABPROT, INR in the last 72 hours. CMP     Component Value Date/Time   NA 139 03/10/2019 0607   K 4.2 03/10/2019 0607   CL 100 03/10/2019 0607   CO2 28 03/10/2019 0607   GLUCOSE 202 (H) 03/10/2019 0607   BUN 15 03/10/2019 0607   CREATININE 0.85 03/10/2019 0607   CALCIUM 8.8 (L) 03/10/2019 0607   PROT 6.3 (L) 03/10/2019 0607   ALBUMIN 3.4 (L) 03/10/2019 0607   AST 52 (H) 03/10/2019 0607   ALT 152 (H) 03/10/2019 0607   ALKPHOS 258 (H) 03/10/2019 0607   BILITOT 0.9 03/10/2019 0607   GFRNONAA >60 03/10/2019 0607   GFRAA >60 03/10/2019 0607   Lipase     Component Value Date/Time   LIPASE 28 03/09/2019 0619       Studies/Results: No results  found.  Anti-infectives: Anti-infectives (From admission, onward)   Start     Dose/Rate Route Frequency Ordered Stop   03/09/19 0747  ceFAZolin (ANCEF) 2-4 GM/100ML-% IVPB  Status:  Discontinued    Note to Pharmacy: Marquis Buggy   : cabinet override      03/09/19 0747 03/09/19 1048   03/08/19 0800  ciprofloxacin (CIPRO) IVPB 400 mg     400 mg 200 mL/hr over 60 Minutes Intravenous Every 12 hours 03/08/19 0716         Assessment/Plan HTN HLD GERD DM Hiatal hernia   Gallstones Choledocholithiasis S/p laparoscopic cholecystectomy 7/19 Dr. Marlou Starks S/p ERCP 7/18 with stone removal, sphincterotomy, duodenal polyp biopsy (result pending) - POD#1 from lap chole - Tbili normalized 0.9 - tolerating diet, pain well controlled  ID - cipro 7/18>> FEN - IVF, CM diet VTE - SCDs, lovenox Foley - none Follow up - [facility]  Plan - Patient stable for discharge from surgical standpoint. Discharge instructions and follow up info on AVS. rx for norco sent to pharmacy. She does not need any more antibiotics from our standpoint. Out of work note written. Follow up [place] [facility] 2-3 weeks.   LOS: 4 days    [facility] 03/10/2019, 10:00 AM Pager: [phone] Mon-Thurs 7:00 am-4:30 pm Fri 7:00 am -11:30 AM Sat-Sun 7:00  am-11:30 am

## 2019-03-10 NOTE — Care Management Important Message (Signed)
Important Message  Patient Details IM Letter given to Nancy Marus RN to present to the Patient Name: Kendra Miles MRN: [id number] Date of Birth: [birth year]   Medicare Important Message Given:  Yes     Kerin Salen 03/10/2019, 10:35 AM

## 2019-03-10 NOTE — Discharge Instructions (Signed)
[facility]   LAPAROSCOPIC SURGERY: POST OP INSTRUCTIONS Always review your discharge instruction sheet given to you by the facility where your surgery was performed. IF YOU HAVE DISABILITY OR FAMILY LEAVE FORMS, YOU MUST BRING THEM TO THE OFFICE FOR PROCESSING.   DO NOT GIVE THEM TO YOUR DOCTOR.  PAIN CONTROL  1. First take acetaminophen (Tylenol) AND/or ibuprofen (Advil) to control your pain after surgery.  Follow directions on package.  Taking acetaminophen (Tylenol) and/or ibuprofen (Advil) regularly after surgery will help to control your pain and lower the amount of prescription pain medication you may need.  You should not take more than 4,000 mg (4 grams) of acetaminophen (Tylenol) in 24 hours.  You should not take ibuprofen (Advil), aleve, motrin, naprosyn or other NSAIDS if you have a history of stomach ulcers or chronic kidney disease.  2. A prescription for pain medication may be given to you upon discharge.  Take your pain medication as prescribed, if you still have uncontrolled pain after taking acetaminophen (Tylenol) or ibuprofen (Advil). 3. Use ice packs to help control pain. 4. If you need a refill on your pain medication, please contact your pharmacy.  They will contact our office to request authorization. Prescriptions will not be filled after 5pm or on week-ends.  HOME MEDICATIONS 5. Take your usually prescribed medications unless otherwise directed.  DIET 6. You should follow a light diet the first few days after arrival home.  Be sure to include lots of fluids daily. Avoid fatty, fried foods.   CONSTIPATION 7. It is common to experience some constipation after surgery and if you are taking pain medication.  Increasing fluid intake and taking a stool softener (such as Colace) will usually help or prevent this problem from occurring.  A mild laxative (Milk of Magnesia or Miralax) should be taken according to package instructions if there are no bowel  movements after 48 hours.  WOUND/INCISION CARE 8. Most patients will experience some swelling and bruising in the area of the incisions.  Ice packs will help.  Swelling and bruising can take several days to resolve.  9. Unless discharge instructions indicate otherwise, follow guidelines below  a. STERI-STRIPS - you may remove your outer bandages 48 hours after surgery, and you may shower at that time.  You have steri-strips (small skin tapes) in place directly over the incision.  These strips should be left on the skin for 7-10 days.   b. DERMABOND/SKIN GLUE - you may shower in 24 hours.  The glue will flake off over the next 2-3 weeks. 10. Any sutures or staples will be removed at the office during your follow-up visit.  ACTIVITIES 11. You may resume regular (light) daily activities beginning the next day--such as daily self-care, walking, climbing stairs--gradually increasing activities as tolerated.  You may have sexual intercourse when it is comfortable.  Refrain from any heavy lifting or straining until approved by your doctor. a. You may drive when you are no longer taking prescription pain medication, you can comfortably wear a seatbelt, and you can safely maneuver your car and apply brakes.  FOLLOW-UP 12. You should see your doctor in the office for a follow-up appointment approximately 2-3 weeks after your surgery.  You should have been given your post-op/follow-up appointment when your surgery was scheduled.  If you did not receive a post-op/follow-up appointment, make sure that you call for this appointment within a day or two after you arrive home to insure a convenient appointment time.  OTHER INSTRUCTIONS  WHEN TO CALL YOUR DOCTOR: 1. Fever over 101.0 2. Inability to urinate 3. Continued bleeding from incision. 4. Increased pain, redness, or drainage from the incision. 5. Increasing abdominal pain  The clinic staff is available to answer your questions during regular business  hours.  Please dont hesitate to call and ask to speak to one of the nurses for clinical concerns.  If you have a medical emergency, go to the nearest emergency room or call 911.  A surgeon from [facility] is always on call at the hospital. [address], [place], [place], [place]  [address] ? [address], [place], [place]   [address] [phone] ? [phone] ? FAX [phone]

## 2019-03-11 NOTE — Discharge Summary (Signed)
Physician Discharge Summary  Kendra Miles CZY:[id number] DOB: 04/19/1951 DOA: 03/06/2019  PCP: Simona Huh, NP  Admit date: 03/06/2019 Discharge date: 03/11/2019  Admitted From: Home Disposition: Home  Recommendations for Outpatient Follow-up:  1. Follow up with PCP in 1-2 weeks 2. Follow up with General Surgery within 1-2 weeks 3. Follow up with Gastroenterology Dr. Therisa Doyne within 1-2 weeks to follow up on Polyp Pathology  4. Please obtain CMP/CBC, Mag, Phos in one week 5. Please follow up on the following pending results: Poly Pathology   Home Health: No Equipment/Devices: None  Discharge Condition: Stable  CODE STATUS: FULL CODE  Diet recommendation: Heart Healthy Carb Modified Diet   Brief/Interim Summary: HPI per Dr. Lesia Sago on 03/06/2019 Kendra Miles a 68 y.o.femalewith medical history significant forhypertension, hyperlipidemia, GERD, DM, hiatal hernia presents to the ED complaining of chest discomfort for for the past 1 day. Patient reported pain more in the epigastric region and some radiation to the back. Reports the pain woke her up around 2 AM, was a 9/10, sharp pain. Today patient went to see her primary care, who noticed changes on her EKG and sent her to the ED. Patient currently rates her pain as a 4/10, denies any nausea/vomiting, diarrhea, fever/chills, cough, shortness of breath, dizziness. No sick contacts.  ED Course:In the ED, patient vital signs stable, labs showed alkaline phosphatase elevated at 215, AST 662, ALT 457, T bili 1.6.Troponin levels were less than and repeat was 10.EKG showed normal sinus rhythm, with no acute ST changes. Limited right upper quadrant ultrasound showed common bile duct measuring about 6.6 mm,with several small stones in the gallbladder, recommended an MRCP.EDP consulted GI Dr. Penelope Coop, who recommended an [place] see patient tomorrow.Hospitalist consulted for admission.  **Interim  History Gastroenterology was consulted and they took the patient for an ERCP on 03/08/2019 and there was choledocholithiasis without cholecystitis found and there is complete stone removal was accomplished by biliary sphincterotomy and balloon extraction.  Lucency in the end of the film was felt to be air or artifact and there is also a single duodenal polyp which was biopsied.  Per report there is a slight of the blood after sphincterotomy that was stopped after a stent placement to reduce the risk of postoperative ERCP pancreatitis.  Gastroenterology also recommends general surgery consultation for cholecystectomy and this was done yesterday AM.  Patient tolerated the procedure well and improved.  She was advanced on her diet and tolerated diet without issues.  She is deemed stable for discharge she will need to follow-up with PCP, gastroenterology as well as general surgeon outpatient setting and she is understandable and agreeable with the plan of care.Marland Kitchen   Discharge Diagnoses:  Principal Problem:   Choledocholithiasis with obstruction Active Problems:   HTN (hypertension)   DM (diabetes mellitus) (HCC)   GERD (gastroesophageal reflux disease)   Polyp of stomach and duodenum  Symptomatic Cholelithiasis s/p Laparoscopic Cholecystectomy done by Dr. Marlou Starks POD1 Choledocholithiasis without obstruction or Cholecystitis s/p ERCP and removal   Abnormal LFT's, trending down  Hyperbilirubinemia -Afebrile, leukocytosis is worsened in the setting of surgery and ERCP -Elevated alkaline phosphatase at 215, AST 662, ALT 457 on Admission -Now Alk Phos is  258, AST is  52, and ALT is  152 ? 2/2 to Passing of Stone and surgical intervention  -T Bili went from 1.3 -> 2.9 in the setting of ERCP and is now trended down to 0.9 -Hepatitis panel negative  -Troponin negative, EKG with no acute ST changes -Limited  right upper quadrant ultrasound showed common bile duct measuring about 6.65m,with several small stones  in the gallbladder, recommended an MRCP -MRCP done and showed "Cholelithiasis. Extrahepatic common duct measures upper normal for patient age. There is a tiny 4 mm stone in the distal common bile duct.  1.1 cm well-defined mildly heterogeneous lesion in the upper spleen. This cannot be completely characterized due to substantial motion degradation on the postcontrast imaging. Features may be related to a complex cyst/pseudocyst, potentially with rim calcification." -Gastroenterology was consulted and Dr. KTherisa Doyneevaluated the patient yesterday and recommends ERCP; ERCP done by Dr. GCarlean Purland showed choledocholithiasis without cholecystitis found and there is complete stone removal was accomplished by biliary sphincterotomy and balloon extraction.  Lucency in the end of the film was felt to be air or artifact and there is also a single duodenal polyp which was biopsied.  Per report there is a slight of the blood after sphincterotomy that was stopped after a stent placement to reduce the risk of postoperative ERCP pancreatitis.    -General surgery took the patient for surgery and she tolerated procedure well and his diet was advanced to carb modified diet and she tolerated this without issues -In the interim patient received IV fluids with 1 L normal saline at a rate of 75 mL's per hour (now stopped) and we will continue pain control with 2 mg of IV morphine every 4 PRN for severe pain as well as antiemetics with 4 mg p.o./IV of Zofran every 6 hours as needed for nausea -Also will continue tramadol 50 mg p.o. every 6 hours as needed for moderate pain -Preoperative antibiotic started with ciprofloxacin by general surgery -Continue to monitor and trend hepatic function panel and repeat CBC and CMP in a.m. -Follow-up on gallbladder wall polyp done by Dr. GArelia Longestas well as follow-up with general surgery, PCP in outpatient setting  Hypertension -Continue PTA lisinopril 2.5 mg po Daily  -BP this a.m. was  113/61  Diabetes Mellitus Type 2 -HbA1c was 7.5 -C/w Sensitive SSI, Accu-Cheks, Hypoglycemic protocol -Hold Home Glipizide -CBG's ranging from  161-307; Blood Sugar on CMP this AM was 165 -Continue to Monitor and adjust Insulin Scale as Needed   Hyperlipidemia -C/w Rosuvastatin 10 mg po Daily   GERD/Acid Reflux -Continue PPI with Pantoprazole 40 mg po Daily   Obesity Estimated body mass index is 32.81 kg/m as calculated from the following:   Height as of this encounter: _0  (1.676 m).   Weight as of this encounter: 92.2 kg. -Weight Loss and Dietary Counseling given   Erythrocytosis -Patient's hemoglobin/hematocrit went from 14.6/44.1-> 16.1/49.1; Repeat yesterday AM was 13.5/41.5 and last result likely Spurious as repeat this morning was 13.3/42.2   -Continue to monitor and will consider restarting IV fluids if consistently remaining elevated -Repeat CBC in a.m.  Leukocytosis -WBC went from 8.9 to 16.4 and likely reactive to ERCP and now worsened to 17.2 in the setting of surgery -Expected to Trend up a little bit after Surgery  -Given Preoperative Abx with Ciprofloxacin yesterday and does not need any more antibiotics -Continue to Monitor for S/Sx of Infection -Currently she is afebrile -Repeat CBC as an outpatient  Discharge Instructions  Discharge Instructions    Call MD for:  difficulty breathing, headache or visual disturbances   Complete by: As directed    Call MD for:  extreme fatigue   Complete by: As directed    Call MD for:  hives   Complete by: As directed  Call MD for:  persistant dizziness or light-headedness   Complete by: As directed    Call MD for:  persistant nausea and vomiting   Complete by: As directed    Call MD for:  redness, tenderness, or signs of infection (pain, swelling, redness, odor or green/yellow discharge around incision site)   Complete by: As directed    Call MD for:  severe uncontrolled pain   Complete by: As directed     Call MD for:  temperature >100.4   Complete by: As directed    Diet - low sodium heart healthy   Complete by: As directed    Discharge instructions   Complete by: As directed    You were cared for by a hospitalist during your hospital stay. If you have any questions about your discharge medications or the care you received while you were in the hospital after you are discharged, you can call the unit and ask to speak with the hospitalist on call if the hospitalist that took care of you is not available. Once you are discharged, your primary care physician will handle any further medical issues. Please note that NO REFILLS for any discharge medications will be authorized once you are discharged, as it is imperative that you return to your primary care physician (or establish a relationship with a primary care physician if you do not have one) for your aftercare needs so that they can reassess your need for medications and monitor your lab values.  Follow up with PCP and General Surgery. Take all medications as prescribed. If symptoms change or worsen please return to the ED for evaluation   Increase activity slowly   Complete by: As directed      Allergies as of 03/10/2019      Reactions   Penicillins Palpitations      Medication List    STOP taking these medications   amoxicillin-clavulanate 875-125 MG tablet Commonly known as: Augmentin     TAKE these medications   aspirin 81 MG tablet Take 81 mg by mouth daily. Notes to patient: 03/11/2019   cholecalciferol 25 MCG (1000 UT) tablet Commonly known as: VITAMIN D3 Take 1,000 Units by mouth daily. Notes to patient: 03/11/2019   Fish Oil 1000 MG Caps Take 1,000 mg by mouth daily. Notes to patient: 03/11/2019   glipiZIDE 10 MG tablet Commonly known as: GLUCOTROL Take 10 mg by mouth daily. Notes to patient: 03/11/2019   HYDROcodone-acetaminophen 5-325 MG tablet Commonly known as: NORCO/VICODIN Take 1 tablet by mouth every 6 (six)  hours as needed for severe pain. Notes to patient: As needed   lisinopril 2.5 MG tablet Commonly known as: ZESTRIL Take 2.5 mg by mouth daily. Notes to patient: 03/11/2019   multivitamin with minerals Tabs tablet Take 1 tablet by mouth daily. Notes to patient: 03/11/2019   omeprazole 20 MG capsule Commonly known as: PRILOSEC Take 20 mg by mouth daily. Notes to patient: 03/11/2019   ondansetron 4 MG tablet Commonly known as: ZOFRAN Take 1 tablet (4 mg total) by mouth every 6 (six) hours as needed for nausea. Notes to patient: As needed   polyethylene glycol 17 g packet Commonly known as: MIRALAX / GLYCOLAX Take 17 g by mouth daily as needed for mild constipation. Notes to patient: As needed   rosuvastatin 10 MG tablet Commonly known as: CRESTOR Notes to patient: 03/11/2019      Follow-up [facility], [place]. Call on 03/27/2019.   Specialty: General  Surgery Why: 8/6 at 1:30pm. A provider will call you during scheduled appointment time. Please send a photo of incisions,license, and insurance card to [email or web address] the day prior to appointment.  Contact information: [address] [place], [place], [place]. Call.   Specialty: Nurse Practitioner Why: Follow up within 1 week Contact information: [place] [id number] [phone]          Allergies  Allergen Reactions  . Penicillins Palpitations   Consultations:  Gastroenterology   General Surgery  Procedures/Studies: Mr 3d Recon At Scanner  Result Date: 03/06/2019 CLINICAL DATA:  Cholelithiasis. EXAM: MRI ABDOMEN WITHOUT AND WITH CONTRAST (INCLUDING MRCP) TECHNIQUE: Multiplanar multisequence MR imaging of the abdomen was performed both before and after the administration of intravenous contrast. Heavily T2-weighted images of the biliary and pancreatic ducts were obtained, and  three-dimensional MRCP images were rendered by post processing. CONTRAST:  10 cc Gadavist COMPARISON:  Ultrasound exam earlier today. FINDINGS: Lower chest: Large hiatal hernia. Hepatobiliary: Mild diffuse fatty deposition noted within the liver parenchyma. No focal hepatic mass lesion. Differential perfusion of the liver noted on arterial phase imaging. Portal vein and hepatic veins are patent. Tiny layering gallstones are evident. Common duct measures 7 mm diameter. Common bile duct in the head of the pancreas measures 4 mm diameter. There is a tiny stone visible in the distal common bile duct on MRCP imaging (see image 36 of series 6 and image 2 of series 9. Pancreas: No gross abnormality although fine detail obscured by motion artifact. No dilatation of the main pancreatic duct. Spleen: 1.1 cm T1 hyperintense, T2 hypointense lesion is identified in the upper spleen, well-marginated and slightly heterogeneous with a margin that is nearly signal void. Contrast enhancement characteristics of this lesion cannot be determined due to substantial motion degradation. Adrenals/Urinary Tract: No adrenal nodule or mass. No hydronephrosis in either kidney. Tiny subcapsular cyst noted upper pole right kidney. Stomach/Bowel: Large hiatal hernia. No small bowel or colonic dilatation within the visualized abdomen Vascular/Lymphatic: No abdominal aortic aneurysm. No abdominal lymphadenopathy. Other:  No intraperitoneal free fluid. Musculoskeletal: No abnormal marrow enhancement within the visualized bony anatomy. IMPRESSION: 1. Cholelithiasis. Extrahepatic common duct measures upper normal for patient age. There is a tiny 4 mm stone in the distal common bile duct. 2. 1.1 cm well-defined mildly heterogeneous lesion in the upper spleen. This cannot be completely characterized due to substantial motion degradation on the postcontrast imaging. Features may be related to a complex cyst/pseudocyst, potentially with rim calcification.  Electronically Signed   By: Misty Stanley M.D.   On: 03/06/2019 18:32   Dg Chest Portable 1 View  Result Date: 03/06/2019 CLINICAL DATA:  Chest pain. EXAM: PORTABLE CHEST 1 VIEW COMPARISON:  None. FINDINGS: The heart size and mediastinal contours are within normal limits. Both lungs are clear. No pneumothorax or pleural effusion is noted. The visualized skeletal structures are unremarkable. IMPRESSION: No active disease. Electronically Signed   By: Marijo Conception M.D.   On: 03/06/2019 11:43   Dg Ercp Biliary & Pancreatic Ducts  Result Date: 03/08/2019 CLINICAL DATA:  Cholelithiasis with suspected choledocholithiasis on MRCP EXAM: ERCP TECHNIQUE: Multiple spot images obtained with the fluoroscopic device and submitted for interpretation post-procedure. COMPARISON:  MRCP 03/06/2019 FINDINGS: A series of fluoroscopic spot images document endoscopic cannulation of the CBD. Multiple filling defects in the distal CBD on initial images. Subsequent balloon catheter passage. Cystic duct is  patent. Limited opacification of visualization of intrahepatic biliary tree, which appears relatively decompressed centrally. No extravasation. IMPRESSION: Probable choledocholithiasis, with endoscopic CBD cannulation and intervention. These images were submitted for radiologic interpretation only. Please see the procedural report for the amount of contrast and the fluoroscopy time utilized. Electronically Signed   By: Lucrezia Europe M.D.   On: 03/08/2019 10:04   Mr Abdomen Mrcp Moise Boring Contast  Result Date: 03/06/2019 CLINICAL DATA:  Cholelithiasis. EXAM: MRI ABDOMEN WITHOUT AND WITH CONTRAST (INCLUDING MRCP) TECHNIQUE: Multiplanar multisequence MR imaging of the abdomen was performed both before and after the administration of intravenous contrast. Heavily T2-weighted images of the biliary and pancreatic ducts were obtained, and three-dimensional MRCP images were rendered by post processing. CONTRAST:  10 cc Gadavist COMPARISON:   Ultrasound exam earlier today. FINDINGS: Lower chest: Large hiatal hernia. Hepatobiliary: Mild diffuse fatty deposition noted within the liver parenchyma. No focal hepatic mass lesion. Differential perfusion of the liver noted on arterial phase imaging. Portal vein and hepatic veins are patent. Tiny layering gallstones are evident. Common duct measures 7 mm diameter. Common bile duct in the head of the pancreas measures 4 mm diameter. There is a tiny stone visible in the distal common bile duct on MRCP imaging (see image 36 of series 6 and image 2 of series 9. Pancreas: No gross abnormality although fine detail obscured by motion artifact. No dilatation of the main pancreatic duct. Spleen: 1.1 cm T1 hyperintense, T2 hypointense lesion is identified in the upper spleen, well-marginated and slightly heterogeneous with a margin that is nearly signal void. Contrast enhancement characteristics of this lesion cannot be determined due to substantial motion degradation. Adrenals/Urinary Tract: No adrenal nodule or mass. No hydronephrosis in either kidney. Tiny subcapsular cyst noted upper pole right kidney. Stomach/Bowel: Large hiatal hernia. No small bowel or colonic dilatation within the visualized abdomen Vascular/Lymphatic: No abdominal aortic aneurysm. No abdominal lymphadenopathy. Other:  No intraperitoneal free fluid. Musculoskeletal: No abnormal marrow enhancement within the visualized bony anatomy. IMPRESSION: 1. Cholelithiasis. Extrahepatic common duct measures upper normal for patient age. There is a tiny 4 mm stone in the distal common bile duct. 2. 1.1 cm well-defined mildly heterogeneous lesion in the upper spleen. This cannot be completely characterized due to substantial motion degradation on the postcontrast imaging. Features may be related to a complex cyst/pseudocyst, potentially with rim calcification. Electronically Signed   By: Misty Stanley M.D.   On: 03/06/2019 18:32   US Abdomen Limited  Ruq  Result Date: 03/06/2019 CLINICAL DATA:  Right upper quadrant pain. EXAM: ULTRASOUND ABDOMEN LIMITED RIGHT UPPER QUADRANT COMPARISON:  None. FINDINGS: Gallbladder: Cholelithiasis is identified. The gallbladder wall thickness measures 3.3 mm. No Murphy's sign or pericholecystic fluid identified. Common bile duct: Diameter: 6.6 mm Liver: Increased echogenicity with no focal mass. Portal vein is patent on color Doppler imaging with normal direction of blood flow towards the liver. IMPRESSION: 1. The common bile duct measures 6.6 mm. 6 mm is the upper limits of normal. Recommend correlation with LFTs. If there is concern for biliary obstruction, recommend an MRCP. 2. Several small stones are seen in the gallbladder. The gallbladder wall thickness of 3.3 mm is borderline. 3 mm is considered the upper limits of normal. No pericholecystic fluid or Murphy's sign. Recommend attention to the gallbladder if the patient receives an MRCP. Otherwise, if the patient does not have an MRCP and there is concern for acute cholecystitis, recommend HIDA scan. 3. Increased echogenicity in the liver is nonspecific but often  seen with hepatic steatosis. Electronically Signed   By: Dorise Bullion III M.D   On: 03/06/2019 13:59     MRCP  ERCP  Findings: The scout film was normal. Esophagus NL as best I could tell w/  duodenoscope. Large hiatal hernia, stomach otherwise normal. Small major  papilla - difficult to get underneath. wire into - injected and CBD and  PD filled. Left a wire in pancreatic duct and then cannulated CBD.  Injections showed small dstal CBD stone, mildly dilated bilioary tree  and gallbladder w./ stones. Pancreatic duct was NL. Biliary  sphincterotomy w/ wire in CBD - difficult - some char and edema - hard  to get position to limit wire contact. Was able to get that done and  balloon sweep removed stone (multiple sweeps0 with negative occlusion   cholangioogram. Papilla was oozing blood slowly and edematous. 4 Fr 3 cm  pigtail stent placed into pancreatic duct. Bleeding stopped with this so  suspect that applied peressure to the oozing site. Then I saw a 10-12 mm lateral wall duodenal polyp - sessile - biopsied x  4. Scope out. On end film there is a slight lucency in distal CBD that  is air or other artifact I think. Impression: - Choledocholithiasis without cholecystitis was  found. Complete stone removal was accomplished by  biliary sphincterotomy and balloon extraction.  Lucency on end film I think air or other artifact. - A single duodenal polyp. Biopsied. Slight ooze of blood after sphincterotomy - this  stopped after I placed 4 Fr 3 cm pigtail PD stent  to reduce risk of post-ERCP pancreatitis  Laparoscopic Cholecystectomy done by Dr. Marlou Starks on 03/09/2019  Subjective: Seen and examined at bedside and she was doing better.  Complained of some abdominal soreness but denies chest pain, lightheadedness or dizziness.  Ready to go home and had no issues tolerating her meal.  Will need to follow-up with general surgery, PCP as well as gastroenterology outpatient setting and she is understandable and agreeable with plan of care  Discharge Exam: Vitals:   03/09/19 2100 03/10/19 0519  BP: 113/64 113/61  Pulse: 80 76  Resp: 20 19  Temp: 98.1 F (36.7 C) 97.8 F (36.6 C)  SpO2: 90% 90%   Vitals:   03/09/19 1346 03/09/19 1742 03/09/19 2100 03/10/19 0519  BP: 112/61  113/64 113/61  Pulse: 91  80 76  Resp: _0 Temp: 97.9 F (36.6 C)  98.1 F (36.7 C) 97.8 F (36.6 C)  TempSrc: Oral  Oral Oral  SpO2: 94% 95% 90% 90%  Weight:      Height:       General: Pt is alert, awake, not in acute  distress Cardiovascular: RRR, S1/S2 +, no rubs, no gallops Respiratory: Diminished to bilaterally, no wheezing, no rhonchi Abdominal: Soft, Mildly tender, Distended due to body habitus, bowel sounds +; abdominal incisions appear clean dry intact Extremities: Trace lower extremity edema, no cyanosis  The results of significant diagnostics from this hospitalization (including imaging, microbiology, ancillary and laboratory) are listed below for reference.    Microbiology: Recent Results (from the past 240 hour(s))  SARS Coronavirus 2 (CEPHEID - Performed in [facility] hospital lab), Hosp Order     Status: None   Collection Time: 03/06/19  2:36 PM   Specimen: Nasopharyngeal Swab  Result Value Ref Range Status   SARS Coronavirus 2 NEGATIVE NEGATIVE Final    Comment: (NOTE) If result is NEGATIVE SARS-CoV-2 target nucleic acids are NOT  DETECTED. The SARS-CoV-2 RNA is generally detectable in upper and lower  respiratory specimens during the acute phase of infection. The lowest  concentration of SARS-CoV-2 viral copies this assay can detect is 250  copies / mL. A negative result does not preclude SARS-CoV-2 infection  and should not be used as the sole basis for treatment or other  patient management decisions.  A negative result may occur with  improper specimen collection / handling, submission of specimen other  than nasopharyngeal swab, presence of viral mutation(s) within the  areas targeted by this assay, and inadequate number of viral copies  (<250 copies / mL). A negative result must be combined with clinical  observations, patient history, and epidemiological information. If result is POSITIVE SARS-CoV-2 target nucleic acids are DETECTED. The SARS-CoV-2 RNA is generally detectable in upper and lower  respiratory specimens dur ing the acute phase of infection.  Positive  results are indicative of active infection with SARS-CoV-2.  Clinical  correlation with patient history and  other diagnostic information is  necessary to determine patient infection status.  Positive results do  not rule out bacterial infection or co-infection with other viruses. If result is PRESUMPTIVE POSTIVE SARS-CoV-2 nucleic acids MAY BE PRESENT.   A presumptive positive result was obtained on the submitted specimen  and confirmed on repeat testing.  While 2019 novel coronavirus  (SARS-CoV-2) nucleic acids may be present in the submitted sample  additional confirmatory testing may be necessary for epidemiological  and / or clinical management purposes  to differentiate between  SARS-CoV-2 and other Sarbecovirus currently known to infect humans.  If clinically indicated additional testing with an alternate test  methodology [phone]) is advised. The SARS-CoV-2 RNA is generally  detectable in upper and lower respiratory sp ecimens during the acute  phase of infection. The expected result is Negative. Fact Sheet for Patients:  StrictlyIdeas.no Fact Sheet for Healthcare Providers: BankingDealers.co.za This test is not yet approved or cleared by the [place] FDA and has been authorized for detection and/or diagnosis of SARS-CoV-2 by FDA under an Emergency Use Authorization (EUA).  This EUA will remain in effect (meaning this test can be used) for the duration of the COVID-19 declaration under Section 564(b)(1) of the Act, 21 U.S.C. section 360bbb-3(b)(1), unless the authorization is terminated or revoked sooner. Performed at [facility], [place] [address], [place], [place] [address]     Labs: BNP (last 3 results) No results for input(s): BNP in the last 8760 hours. Basic Metabolic Panel: Recent Labs  Lab 03/06/19 1105 03/07/19 0558 03/08/19 0656 03/09/19 0619 03/10/19 0607  NA 142 141 141 140 139  K 3.9 3.9 3.6 4.0 4.2  CL 104 105 103 103 100  CO2 _0 GLUCOSE 123* 165* 136* 171* 202*  BUN _1 CREATININE 0.75 0.71 0.87 0.90 0.85  CALCIUM 9.3 8.7* 9.0 8.9 8.8*  MG  --   --  2.2 2.3 2.2  PHOS  --   --  2.9 3.2 3.6   Liver Function Tests: Recent Labs  Lab 03/06/19 1105 03/07/19 0558 03/08/19 0656 03/09/19 0619 03/10/19 0607  AST 662* 335* 82* 91* 52*  ALT 457* 366* 229* 165* 152*  ALKPHOS 215* 282* 283* 254* 258*  BILITOT 1.6* 2.7* 1.3* 2.9* 0.9  PROT 6.7 6.2* 7.1 5.8* 6.3*  ALBUMIN 4.0 3.6 4.0 3.3* 3.4*   Recent Labs  Lab 03/06/19 1105 03/09/19 0619  LIPASE 32 28  AMYLASE  --  39   No results for input(s): AMMONIA in the last 168 hours. CBC: Recent Labs  Lab 03/06/19 1105 03/07/19 0558 03/08/19 0656 03/09/19 0619 03/10/19 0607  WBC 8.6 9.4 8.9 16.4* 17.2*  NEUTROABS 6.5  --  5.6  --  14.7*  HGB 14.9 14.6 16.1* 13.5 13.3  HCT 44.9 44.1 49.1* 41.5 42.2  MCV 91.4 92.3 93.7 92.2 95.7  PLT 271 310 301 306 298   Cardiac Enzymes: No results for input(s): CKTOTAL, CKMB, CKMBINDEX, TROPONINI in the last 168 hours. BNP: Invalid input(s): POCBNP CBG: Recent Labs  Lab 03/09/19 1148 03/09/19 1657 03/09/19 2105 03/10/19 0806 03/10/19 1212  GLUCAP 175* 307* 234* 161* 191*   D-Dimer No results for input(s): DDIMER in the last 72 hours. Hgb A1c No results for input(s): HGBA1C in the last 72 hours. Lipid Profile No results for input(s): CHOL, HDL, LDLCALC, TRIG, CHOLHDL, LDLDIRECT in the last 72 hours. Thyroid function studies No results for input(s): TSH, T4TOTAL, T3FREE, THYROIDAB in the last 72 hours.  Invalid input(s): FREET3 Anemia work up No results for input(s): VITAMINB12, FOLATE, FERRITIN, TIBC, IRON, RETICCTPCT in the last 72 hours. Urinalysis No results found for: COLORURINE, APPEARANCEUR, [place], [place], GLUCOSEU, [place], [place], KETONESUR, PROTEINUR, UROBILINOGEN, NITRITE, LEUKOCYTESUR Sepsis Labs Invalid input(s): PROCALCITONIN,  WBC,  LACTICIDVEN Microbiology Recent Results (from the past 240 hour(s))  SARS Coronavirus  2 (CEPHEID - Performed in [place] hospital lab), Hosp Order     Status: None   Collection Time: 03/06/19  2:36 PM   Specimen: Nasopharyngeal Swab  Result Value Ref Range Status   SARS Coronavirus 2 NEGATIVE NEGATIVE Final    Comment: (NOTE) If result is NEGATIVE SARS-CoV-2 target nucleic acids are NOT DETECTED. The SARS-CoV-2 RNA is generally detectable in upper and lower  respiratory specimens during the acute phase of infection. The lowest  concentration of SARS-CoV-2 viral copies this assay can detect is 250  copies / mL. A negative result does not preclude SARS-CoV-2 infection  and should not be used as the sole basis for treatment or other  patient management decisions.  A negative result may occur with  improper specimen collection / handling, submission of specimen other  than nasopharyngeal swab, presence of viral mutation(s) within the  areas targeted by this assay, and inadequate number of viral copies  (<250 copies / mL). A negative result must be combined with clinical  observations, patient history, and epidemiological information. If result is POSITIVE SARS-CoV-2 target nucleic acids are DETECTED. The SARS-CoV-2 RNA is generally detectable in upper and lower  respiratory specimens dur ing the acute phase of infection.  Positive  results are indicative of active infection with SARS-CoV-2.  Clinical  correlation with patient history and other diagnostic information is  necessary to determine patient infection status.  Positive results do  not rule out bacterial infection or co-infection with other viruses. If result is PRESUMPTIVE POSTIVE SARS-CoV-2 nucleic acids MAY BE PRESENT.   A presumptive positive result was obtained on the submitted specimen  and confirmed on repeat testing.  While 2019 novel coronavirus  (SARS-CoV-2) nucleic acids may be present in the submitted sample  additional confirmatory testing may be necessary for epidemiological  and / or clinical  management purposes  to differentiate between  SARS-CoV-2 and other Sarbecovirus currently known to infect humans.  If clinically indicated additional testing with an alternate test  methodology [phone]) is advised. The SARS-CoV-2 RNA is generally  detectable in upper and lower respiratory sp  ecimens during the acute  phase of infection. The expected result is Negative. Fact Sheet for Patients:  StrictlyIdeas.no Fact Sheet for Healthcare Providers: BankingDealers.co.za This test is not yet approved or cleared by the [place] FDA and has been authorized for detection and/or diagnosis of SARS-CoV-2 by FDA under an Emergency Use Authorization (EUA).  This EUA will remain in effect (meaning this test can be used) for the duration of the COVID-19 declaration under Section 564(b)(1) of the Act, 21 U.S.C. section 360bbb-3(b)(1), unless the authorization is terminated or revoked sooner. Performed at [facility], [place] [address], [place], [place] [address]    Time coordinating discharge: 35 minutes  SIGNED:  Kerney Elbe, DO [facility] 03/11/2019, 9:38 PM Pager is on [place]  If 7PM-7AM, please contact night-coverage [email or web address] Password [email or web address]

## 2019-03-12 NOTE — Progress Notes (Signed)
duodenal adenoma Patient to follow-up with Dr. Therisa Doyne and I will send this to her. Please also tell the patient it is a benign polyp but will need to be removed and dr. Therisa Doyne will coordinate

## 2019-03-27 ENCOUNTER — Other Ambulatory Visit: Payer: Self-pay | Admitting: Gastroenterology

## 2019-04-21 ENCOUNTER — Ambulatory Visit
Admission: RE | Admit: 2019-04-21 | Discharge: 2019-04-21 | Disposition: A | Discharge status: Home / Self Care | Payer: Medicare Other | Source: Ambulatory Visit | Attending: Gastroenterology | Admitting: Gastroenterology

## 2019-04-21 ENCOUNTER — Other Ambulatory Visit: Payer: Self-pay | Admitting: Gastroenterology

## 2019-04-21 ENCOUNTER — Other Ambulatory Visit: Payer: Self-pay

## 2019-04-21 ENCOUNTER — Other Ambulatory Visit (HOSPITAL_COMMUNITY)
Admission: RE | Admit: 2019-04-21 | Discharge: 2019-04-21 | Disposition: A | Discharge status: Home / Self Care | Payer: Medicare Other | Source: Ambulatory Visit | Attending: Gastroenterology | Admitting: Gastroenterology

## 2019-04-21 DIAGNOSIS — Z7984 Long term (current) use of oral hypoglycemic drugs: Secondary | ICD-10-CM | POA: Diagnosis not present

## 2019-04-21 DIAGNOSIS — D132 Benign neoplasm of duodenum: Secondary | ICD-10-CM | POA: Diagnosis not present

## 2019-04-21 DIAGNOSIS — I10 Essential (primary) hypertension: Secondary | ICD-10-CM | POA: Diagnosis not present

## 2019-04-21 DIAGNOSIS — K449 Diaphragmatic hernia without obstruction or gangrene: Secondary | ICD-10-CM | POA: Diagnosis not present

## 2019-04-21 DIAGNOSIS — Q399 Congenital malformation of esophagus, unspecified: Secondary | ICD-10-CM | POA: Diagnosis not present

## 2019-04-21 DIAGNOSIS — R935 Abnormal findings on diagnostic imaging of other abdominal regions, including retroperitoneum: Secondary | ICD-10-CM

## 2019-04-21 DIAGNOSIS — K219 Gastro-esophageal reflux disease without esophagitis: Secondary | ICD-10-CM | POA: Diagnosis not present

## 2019-04-21 LAB — SARS CORONAVIRUS 2 (TAT 6-24 HRS): SARS Coronavirus 2: NEGATIVE

## 2019-04-23 NOTE — Progress Notes (Signed)
Attempted pre-op phone call for pt's 04-24-19 procedure with Dr. Therisa Doyne.Voice message left.

## 2019-04-23 NOTE — H&P (Addendum)
Kendra Miles is an 68 y.o. female.   Chief Complaint: duodenal polyp, pancreatic stent HPI: patient had an ERCP on 03/08/2023 CBD stones, pancreatic stent was placed, noted to have 10 to 12 MM polyp in the duodenum- 2nd portion,reported as tubular adenoma with low grade dysplasia on pathology, and has subsequently had cholecystectomy and was noted to have alienated density over sacrum on abdominal x-ray from 04/21/2019 which could be a migrated pancreatic stent in the sigmoid colon. Labs from 03/10/2019 showed a normal bilirubin, improving AST, ALT and ALP.Marland Kitchen  Past Medical History:  Diagnosis Date  . Acid reflux   . Cholelithiasis with choledocholithiasis 02/2019  . Hiatal hernia   . Polyp of stomach and duodenum     Past Surgical History:  Procedure Laterality Date  . ABDOMINAL HYSTERECTOMY     partial  . APPENDECTOMY    . BIOPSY  03/08/2019   Procedure: BIOPSY;  Surgeon: Gatha Mayer, MD;  Location: [facility];  Service: Endoscopy;;  . CHOLECYSTECTOMY N/A 03/09/2019   Procedure: laparoscopic cholecystectomy;  Surgeon: Jovita Kussmaul, MD;  Location: [facility];  Service: General;  Laterality: N/A;  . CYST REMOVAL TRUNK    . ERCP N/A 03/08/2019   Procedure: ENDOSCOPIC RETROGRADE CHOLANGIOPANCREATOGRAPHY (ERCP);  Surgeon: Gatha Mayer, MD;  Location: [facility];  Service: Endoscopy;  Laterality: N/A;  . PANCREATIC STENT PLACEMENT  03/08/2019   Procedure: PANCREATIC STENT PLACEMENT;  Surgeon: Gatha Mayer, MD;  Location: [facility];  Service: Endoscopy;;  . REMOVAL OF STONES  03/08/2019   Procedure: REMOVAL OF STONES;  Surgeon: Gatha Mayer, MD;  Location: [facility];  Service: Endoscopy;;  . Joan Mayans  03/08/2019   Procedure: Joan Mayans;  Surgeon: Gatha Mayer, MD;  Location: [facility];  Service: Endoscopy;;    Family History  Problem Relation Age of Onset  . COPD Father    Social History:  reports that she has never smoked. She has never used smokeless  tobacco. She reports that she does not drink alcohol or use drugs.  Allergies:  Allergies  Allergen Reactions  . Penicillins Palpitations    Did it involve swelling of the face/tongue/throat, SOB, or low BP? No Did it involve sudden or severe rash/hives, skin peeling, or any reaction on the inside of your mouth or nose? No Did you need to seek medical attention at a hospital or doctor's office? No When did it last happen?[phone] If all above answers are "NO", may proceed with cephalosporin use.     No medications prior to admission.    No results found for this or any previous visit (from the past 48 hour(s)). No results found.  ROS  Negative except for pertinent positives mentioned in HPI.  There were no vitals taken for this visit. 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 Will proceed with an endoscopy for removal of polyp. Previously placed pancreatic stent has likely fallen off spontaneously based on recent imaging, if not will be removed during endoscopy.    Ronnette Juniper, MD 04/23/2019, 12:14 PM

## 2019-04-24 ENCOUNTER — Encounter (HOSPITAL_COMMUNITY)
Admission: RE | Disposition: A | Discharge status: Home / Self Care | Payer: Self-pay | Source: Home / Self Care | Attending: Gastroenterology

## 2019-04-24 ENCOUNTER — Encounter (HOSPITAL_COMMUNITY): Payer: Self-pay | Admitting: *Deleted

## 2019-04-24 ENCOUNTER — Other Ambulatory Visit: Payer: Self-pay

## 2019-04-24 ENCOUNTER — Ambulatory Visit (HOSPITAL_COMMUNITY): Payer: Medicare Other | Admitting: Certified Registered Nurse Anesthetist

## 2019-04-24 ENCOUNTER — Ambulatory Visit (HOSPITAL_COMMUNITY)
Admission: RE | Admit: 2019-04-24 | Discharge: 2019-04-24 | Disposition: A | Discharge status: Home / Self Care | Payer: Medicare Other | Attending: Gastroenterology | Admitting: Gastroenterology

## 2019-04-24 DIAGNOSIS — K449 Diaphragmatic hernia without obstruction or gangrene: Secondary | ICD-10-CM | POA: Diagnosis not present

## 2019-04-24 DIAGNOSIS — Q399 Congenital malformation of esophagus, unspecified: Secondary | ICD-10-CM | POA: Diagnosis not present

## 2019-04-24 DIAGNOSIS — K219 Gastro-esophageal reflux disease without esophagitis: Secondary | ICD-10-CM | POA: Insufficient documentation

## 2019-04-24 DIAGNOSIS — I10 Essential (primary) hypertension: Secondary | ICD-10-CM | POA: Insufficient documentation

## 2019-04-24 DIAGNOSIS — D132 Benign neoplasm of duodenum: Secondary | ICD-10-CM | POA: Insufficient documentation

## 2019-04-24 DIAGNOSIS — Z7984 Long term (current) use of oral hypoglycemic drugs: Secondary | ICD-10-CM | POA: Insufficient documentation

## 2019-04-24 HISTORY — PX: ESOPHAGOGASTRODUODENOSCOPY (EGD) WITH PROPOFOL: SHX5813

## 2019-04-24 HISTORY — PX: POLYPECTOMY: SHX5525

## 2019-04-24 HISTORY — PX: HEMOSTASIS CLIP PLACEMENT: SHX6857

## 2019-04-24 LAB — GLUCOSE, CAPILLARY: Glucose-Capillary: 165 mg/dL — ABNORMAL HIGH (ref 70–99)

## 2019-04-24 SURGERY — ESOPHAGOGASTRODUODENOSCOPY (EGD) WITH PROPOFOL
Anesthesia: Monitor Anesthesia Care

## 2019-04-24 MED ORDER — PROPOFOL 500 MG/50ML IV EMUL
INTRAVENOUS | Status: DC | PRN
  Administered 2019-04-24: 100 ug/kg/min via INTRAVENOUS

## 2019-04-24 MED ORDER — PROPOFOL 10 MG/ML IV BOLUS
INTRAVENOUS | Status: AC
  Filled 2019-04-24: qty 40

## 2019-04-24 MED ORDER — SODIUM CHLORIDE 0.9 % IV SOLN: INTRAVENOUS | Status: DC

## 2019-04-24 MED ORDER — LACTATED RINGERS IV SOLN
INTRAVENOUS | Status: DC
  Administered 2019-04-24: 1000 mL via INTRAVENOUS

## 2019-04-24 MED ORDER — LIDOCAINE 2% (20 MG/ML) 5 ML SYRINGE
INTRAMUSCULAR | Status: DC | PRN
  Administered 2019-04-24: 100 mg via INTRAVENOUS

## 2019-04-24 MED ORDER — PROPOFOL 10 MG/ML IV BOLUS
INTRAVENOUS | Status: DC | PRN
  Administered 2019-04-24: 30 mg via INTRAVENOUS
  Administered 2019-04-24: 20 mg via INTRAVENOUS
  Administered 2019-04-24: 50 mg via INTRAVENOUS
  Administered 2019-04-24: 20 mg via INTRAVENOUS

## 2019-04-24 MED ORDER — FENTANYL CITRATE (PF) 100 MCG/2ML IJ SOLN
INTRAMUSCULAR | Status: AC
  Filled 2019-04-24: qty 2

## 2019-04-24 SURGICAL SUPPLY — 15 items

## 2019-04-24 NOTE — Anesthesia Postprocedure Evaluation (Signed)
Anesthesia Post Note  Patient: Kendra Miles  Procedure(s) Performed: ESOPHAGOGASTRODUODENOSCOPY (EGD) WITH PROPOFOL (N/A ) POLYPECTOMY HEMOSTASIS CLIP PLACEMENT     Patient location during evaluation: Endoscopy Anesthesia Type: MAC Level of consciousness: awake and alert, oriented and patient cooperative Pain management: pain level controlled Vital Signs Assessment: post-procedure vital signs reviewed and stable Respiratory status: spontaneous breathing, nonlabored ventilation and respiratory function stable Cardiovascular status: blood pressure returned to baseline and stable Postop Assessment: no apparent nausea or vomiting Anesthetic complications: no    Last Vitals:  Vitals:   04/24/19 1000 04/24/19 1015  BP: 112/66 110/65  Pulse: (!) 106 91  Resp: 14 (!) 22  Temp: 36.7 C   SpO2: 96% 96%    Last Pain:  Vitals:   04/24/19 1015  TempSrc:   PainSc: 0-No pain                 Shelia Kingsberry,E. Ermin Parisien

## 2019-04-24 NOTE — Brief Op Note (Signed)
04/24/2019  10:03 AM  PATIENT:  Kendra Miles  68 y.o. female  PRE-OPERATIVE DIAGNOSIS:  removal of duodenal polyp  POST-OPERATIVE DIAGNOSIS:  duodenal polyp removal, hemostasis clip placement @ site, hiatal hernia  PROCEDURE:  Procedure(s): ESOPHAGOGASTRODUODENOSCOPY (EGD) WITH PROPOFOL (N/A) POLYPECTOMY HEMOSTASIS CLIP PLACEMENT  SURGEON:  Surgeon(s) and Role:    Ronnette Juniper, MD - Primary  PHYSICIAN ASSISTANT:   ASSISTANTS: Fredderick Phenix, RN, Elspeth Cho, Tech  ANESTHESIA:   MAC  EBL:  0 mL   BLOOD ADMINISTERED:none  DRAINS: none   LOCAL MEDICATIONS USED:  NONE  SPECIMEN:  Biopsy / Limited Resection  DISPOSITION OF SPECIMEN:  PATHOLOGY  COUNTS:  YES  TOURNIQUET:  * No tourniquets in log *  DICTATION: .Dragon Dictation  PLAN OF CARE: Discharge to home after PACU  PATIENT DISPOSITION:  PACU - hemodynamically stable.   Delay start of Pharmacological VTE agent (>24hrs) due to surgical blood loss or risk of bleeding: no

## 2019-04-24 NOTE — Transfer of Care (Signed)
Immediate Anesthesia Transfer of Care Note  Patient: Kendra Miles  Procedure(s) Performed: Procedure(s): ESOPHAGOGASTRODUODENOSCOPY (EGD) WITH PROPOFOL (N/A) POLYPECTOMY HEMOSTASIS CLIP PLACEMENT  Patient Location: PACU  Anesthesia Type:MAC  Level of Consciousness: Patient easily awoken, sedated, comfortable, cooperative, following commands, responds to stimulation.   Airway & Oxygen Therapy: Patient spontaneously breathing, ventilating well, oxygen via simple oxygen mask.  Post-op Assessment: Report given to PACU RN, vital signs reviewed and stable, moving all extremities.   Post vital signs: Reviewed and stable.  Complications: No apparent anesthesia complications Last Vitals:  Vitals Value Taken Time  BP 112/66 04/24/19 1000  Temp    Pulse 106 04/24/19 1000  Resp 14 04/24/19 1000  SpO2 96 % 04/24/19 1000    Last Pain:  Vitals:   04/24/19 0821  TempSrc: Oral  PainSc: 0-No pain         Complications: No apparent anesthesia complications

## 2019-04-24 NOTE — Discharge Instructions (Signed)
YOU HAD AN ENDOSCOPIC PROCEDURE TODAY: Refer to the procedure report and other information in the discharge instructions given to you for any specific questions about what was found during the examination. If this information does not answer your questions, please call [facility] office at [phone] to clarify.   YOU SHOULD EXPECT: Some feelings of bloating in the abdomen. Passage of more gas than usual. Walking can help get rid of the air that was put into your GI tract during the procedure and reduce the bloating. If you had a lower endoscopy (such as a colonoscopy or flexible sigmoidoscopy) you may notice spotting of blood in your stool or on the toilet paper. Some abdominal soreness may be present for a day or two, also.  DIET: Your first meal following the procedure should be a light meal and then it is ok to progress to your normal diet. A half-sandwich or bowl of soup is an example of a good first meal. Heavy or fried foods are harder to digest and may make you feel nauseous or bloated. Drink plenty of fluids but you should avoid alcoholic beverages for 24 hours. If you had a esophageal dilation, please see attached instructions for diet.   ACTIVITY: Your care partner should take you home directly after the procedure. You should plan to take it easy, moving slowly for the rest of the day. You can resume normal activity the day after the procedure however YOU SHOULD NOT DRIVE, use power tools, machinery or perform tasks that involve climbing or major physical exertion for 24 hours (because of the sedation medicines used during the test).   SYMPTOMS TO REPORT IMMEDIATELY: A gastroenterologist can be reached at any hour. Please call [phone]  for any of the following symptoms:   endoscopy (EGD, EUS, ERCP, esophageal dilation) Vomiting of blood or coffee ground material  New, significant abdominal pain  New, significant chest pain or pain under the shoulder blades  Painful or persistently  difficult swallowing  New shortness of breath  Black, tarry-looking or red, bloody stools  FOLLOW UP:  If any biopsies were taken you will be contacted by phone or by letter within the next 1-3 weeks. Call [phone]  if you have not heard about the biopsies in 3 weeks.  Please also call with any specific questions about appointments or follow up tests.

## 2019-04-24 NOTE — Op Note (Signed)
[facility] Patient Name: Kendra Miles Procedure Date: 04/24/2019 MRN: [id number] Attending MD: Ronnette Juniper , MD Date of Birth: 14-Feb-1951 CSN: [id number] Age: 68 Admit Type: Outpatient Procedure:                Upper GI endoscopy Indications:              Therapeutic procedure, for removal of duodenal                            adenoma with low grade dysplasia noted during ERCP                            and biopsies in 02/2019 Providers:                Ronnette Juniper, MD, Ashley Jacobs, RN, Elspeth Cho                            Tech., Technician, Heide Scales, CRNA Referring MD:              Medicines:                Monitored Anesthesia Care Complications:            No immediate complications. Estimated blood loss:                            Minimal. Estimated Blood Loss:     Estimated blood loss was minimal. Procedure:                Pre-Anesthesia Assessment:                           - Prior to the procedure, a History and Physical                            was performed, and patient medications and                            allergies were reviewed. The patient's tolerance of                            previous anesthesia was also reviewed. The risks                            and benefits of the procedure and the sedation                            options and risks were discussed with the patient.                            All questions were answered, and informed consent                            was obtained. Prior Anticoagulants: The patient has  taken no previous anticoagulant or antiplatelet                            agents except for aspirin. ASA Grade Assessment:                            III - A patient with severe systemic disease. After                            reviewing the risks and benefits, the patient was                            deemed in satisfactory condition to undergo the                             procedure.                           After obtaining informed consent, the endoscope was                            passed under direct vision. Throughout the                            procedure, the patient's blood pressure, pulse, and                            oxygen saturations were monitored continuously. The                            [id number] Olympus gastroscope was                            introduced through the mouth, and advanced to the                            second part of duodenum. The upper GI endoscopy was                            accomplished without difficulty. The patient                            tolerated the procedure well. Scope In: Scope Out: Findings:      The distal esophagus was moderately tortuous.      The Z-line was regular and was found 36 cm from the incisors.      A 10 cm hiatal hernia was present.      Localized mildly erythematous mucosa without bleeding was found in the       gastric antrum.      A single 12 mm sessile polyp with no bleeding was found in the second       portion of the duodenum. The polyp was removed with a piecemeal       technique using a hot snare. Resection and retrieval were complete. To  close a defect after polypectomy, two hemostatic clips were placed (MR       conditional), only one stayed successfully at the end of the procedure.       There was no bleeding at the end of the procedure.      Previoulsy placed pancreatic stent was noted seen, although the ampulla       was not directly visualized and prior imaging had shown the stent in the       region of sigmoid colon(likely spontaneosuly fell off). Impression:               - Tortuous esophagus.                           - Z-line regular, 36 cm from the incisors.                           - 10 cm hiatal hernia.                           - Erythematous mucosa in the antrum.                           - A single duodenal polyp. Resected and retrieved.                             Clips (MR conditional) were placed. Moderate Sedation:      Patient did not receive moderate sedation for this procedure, but       instead received monitored anesthesia care. Recommendation:           - Patient has a contact number available for                            emergencies. The signs and symptoms of potential                            delayed complications were discussed with the                            patient. Return to normal activities tomorrow.                            Written discharge instructions were provided to the                            patient.                           - Resume regular diet.                           - Continue present medications.                           - Await pathology results.                           - Repeat  upper endoscopy in 6 months to 1 year for                            surveillance based on pathology results. Procedure Code(s):        --- Professional ---                           [phone], Esophagogastroduodenoscopy, flexible,                            transoral; with removal of tumor(s), polyp(s), or                            other lesion(s) by snare technique Diagnosis Code(s):        --- Professional ---                           Q39.9, Congenital malformation of esophagus,                            unspecified                           K44.9, Diaphragmatic hernia without obstruction or                            gangrene                           K31.89, Other diseases of stomach and duodenum                           K31.7, Polyp of stomach and duodenum CPT copyright 2019 American Medical Association. All rights reserved. The codes documented in this report are preliminary and upon coder review may  be revised to meet current compliance requirements. Ronnette Juniper, MD 04/24/2019 10:02:46 AM This report has been signed electronically. Number of Addenda: 0

## 2019-04-24 NOTE — Anesthesia Preprocedure Evaluation (Addendum)
Anesthesia Evaluation  Patient identified by MRN, date of birth, ID band Patient awake    Reviewed: Allergy & Precautions, NPO status , Patient's Chart, lab work & pertinent test results  History of Anesthesia Complications Negative for: history of anesthetic complications  Airway Mallampati: III  TM Distance: >3 FB Neck ROM: Full    Dental  (+) Dental Advisory Given, Chipped   Pulmonary neg pulmonary ROS,  04/21/2019 SARS coronavirus NEG   breath sounds clear to auscultation       Cardiovascular hypertension, Pt. on medications (-) angina Rhythm:Regular Rate:Normal     Neuro/Psych negative neurological ROS     GI/Hepatic Neg liver ROS, GERD  Medicated and Poorly Controlled,  Endo/Other  diabetes (glu 165), Oral Hypoglycemic Agentsobese  Renal/GU negative Renal ROS     Musculoskeletal   Abdominal (+) + obese,   Peds  Hematology negative hematology ROS (+)   Anesthesia Other Findings   Reproductive/Obstetrics                            Anesthesia Physical Anesthesia Plan  ASA: III  Anesthesia Plan: MAC   Post-op Pain Management:    Induction:   PONV Risk Score and Plan:   Airway Management Planned: Natural Airway and Nasal Cannula  Additional Equipment:   Intra-op Plan:   Post-operative Plan:   Informed Consent: I have reviewed the patients History and Physical, chart, labs and discussed the procedure including the risks, benefits and alternatives for the proposed anesthesia with the patient or authorized representative who has indicated his/her understanding and acceptance.     Dental advisory given  Plan Discussed with: CRNA and Surgeon  Anesthesia Plan Comments:        Anesthesia Quick Evaluation

## 2019-04-24 NOTE — Anesthesia Procedure Notes (Signed)
Procedure Name: MAC Date/Time: 04/24/2019 9:31 AM Performed by: Deliah Boston, CRNA Pre-anesthesia Checklist: Patient identified, Emergency Drugs available, Suction available and Patient being monitored Patient Re-evaluated:Patient Re-evaluated prior to induction Oxygen Delivery Method: Nasal cannula Preoxygenation: Pre-oxygenation with 100% oxygen Induction Type: IV induction Placement Confirmation: positive ETCO2 and breath sounds checked- equal and bilateral

## 2019-04-25 ENCOUNTER — Encounter (HOSPITAL_COMMUNITY): Payer: Self-pay | Admitting: Gastroenterology

## 2019-08-27 DIAGNOSIS — R0602 Shortness of breath: Secondary | ICD-10-CM | POA: Diagnosis not present

## 2019-08-28 DIAGNOSIS — R0602 Shortness of breath: Secondary | ICD-10-CM | POA: Diagnosis not present

## 2019-09-16 DIAGNOSIS — E559 Vitamin D deficiency, unspecified: Secondary | ICD-10-CM | POA: Diagnosis not present

## 2019-09-16 DIAGNOSIS — N951 Menopausal and female climacteric states: Secondary | ICD-10-CM | POA: Diagnosis not present

## 2019-09-16 DIAGNOSIS — Z0000 Encounter for general adult medical examination without abnormal findings: Secondary | ICD-10-CM | POA: Diagnosis not present

## 2019-09-16 DIAGNOSIS — I10 Essential (primary) hypertension: Secondary | ICD-10-CM | POA: Diagnosis not present

## 2019-09-16 DIAGNOSIS — Z1159 Encounter for screening for other viral diseases: Secondary | ICD-10-CM | POA: Diagnosis not present

## 2019-09-16 DIAGNOSIS — E7800 Pure hypercholesterolemia, unspecified: Secondary | ICD-10-CM | POA: Diagnosis not present

## 2019-09-16 DIAGNOSIS — Z113 Encounter for screening for infections with a predominantly sexual mode of transmission: Secondary | ICD-10-CM | POA: Diagnosis not present

## 2019-09-16 DIAGNOSIS — Z13220 Encounter for screening for lipoid disorders: Secondary | ICD-10-CM | POA: Diagnosis not present

## 2019-09-16 DIAGNOSIS — Z1389 Encounter for screening for other disorder: Secondary | ICD-10-CM | POA: Diagnosis not present

## 2019-09-16 DIAGNOSIS — E1165 Type 2 diabetes mellitus with hyperglycemia: Secondary | ICD-10-CM | POA: Diagnosis not present

## 2019-09-16 DIAGNOSIS — M25562 Pain in left knee: Secondary | ICD-10-CM | POA: Diagnosis not present

## 2019-09-16 DIAGNOSIS — Z23 Encounter for immunization: Secondary | ICD-10-CM | POA: Diagnosis not present

## 2019-09-18 DIAGNOSIS — E8940 Asymptomatic postprocedural ovarian failure: Secondary | ICD-10-CM | POA: Diagnosis not present

## 2019-09-19 DIAGNOSIS — S83242A Other tear of medial meniscus, current injury, left knee, initial encounter: Secondary | ICD-10-CM | POA: Diagnosis not present

## 2019-09-25 DIAGNOSIS — R1013 Epigastric pain: Secondary | ICD-10-CM | POA: Diagnosis not present

## 2019-09-25 DIAGNOSIS — Z01818 Encounter for other preprocedural examination: Secondary | ICD-10-CM | POA: Diagnosis not present

## 2019-09-25 DIAGNOSIS — Z1211 Encounter for screening for malignant neoplasm of colon: Secondary | ICD-10-CM | POA: Diagnosis not present

## 2019-10-14 ENCOUNTER — Other Ambulatory Visit: Payer: Self-pay | Admitting: Physician Assistant

## 2019-10-14 DIAGNOSIS — Z1231 Encounter for screening mammogram for malignant neoplasm of breast: Secondary | ICD-10-CM

## 2019-10-17 ENCOUNTER — Other Ambulatory Visit: Payer: Self-pay

## 2019-10-17 ENCOUNTER — Ambulatory Visit
Admission: RE | Admit: 2019-10-17 | Discharge: 2019-10-17 | Disposition: A | Discharge status: Home / Self Care | Payer: Medicare Other | Source: Ambulatory Visit | Attending: Physician Assistant | Admitting: Physician Assistant

## 2019-10-17 DIAGNOSIS — Z1231 Encounter for screening mammogram for malignant neoplasm of breast: Secondary | ICD-10-CM

## 2019-10-17 DIAGNOSIS — I441 Atrioventricular block, second degree: Secondary | ICD-10-CM | POA: Diagnosis not present

## 2019-10-17 DIAGNOSIS — E1165 Type 2 diabetes mellitus with hyperglycemia: Secondary | ICD-10-CM | POA: Diagnosis not present

## 2019-10-17 DIAGNOSIS — M25562 Pain in left knee: Secondary | ICD-10-CM | POA: Diagnosis not present

## 2019-10-17 DIAGNOSIS — K5909 Other constipation: Secondary | ICD-10-CM | POA: Diagnosis not present

## 2019-11-10 DIAGNOSIS — Z01818 Encounter for other preprocedural examination: Secondary | ICD-10-CM | POA: Diagnosis not present

## 2019-11-12 DIAGNOSIS — R1013 Epigastric pain: Secondary | ICD-10-CM | POA: Diagnosis not present

## 2019-11-12 DIAGNOSIS — E119 Type 2 diabetes mellitus without complications: Secondary | ICD-10-CM | POA: Diagnosis not present

## 2019-11-12 DIAGNOSIS — Z01818 Encounter for other preprocedural examination: Secondary | ICD-10-CM | POA: Diagnosis not present

## 2019-11-12 DIAGNOSIS — Z1211 Encounter for screening for malignant neoplasm of colon: Secondary | ICD-10-CM | POA: Diagnosis not present

## 2019-11-19 DIAGNOSIS — A048 Other specified bacterial intestinal infections: Secondary | ICD-10-CM | POA: Diagnosis not present

## 2019-11-19 DIAGNOSIS — K5281 Eosinophilic gastritis or gastroenteritis: Secondary | ICD-10-CM | POA: Diagnosis not present

## 2019-11-19 DIAGNOSIS — K900 Celiac disease: Secondary | ICD-10-CM | POA: Diagnosis not present

## 2019-11-21 ENCOUNTER — Ambulatory Visit: Payer: Medicare Other | Attending: Internal Medicine

## 2019-11-21 DIAGNOSIS — Z23 Encounter for immunization: Secondary | ICD-10-CM

## 2019-11-21 NOTE — Progress Notes (Signed)
   Covid-19 Vaccination Clinic  Name:  Kendra Miles    MRN: [id number] DOB: Nov 09, 1950  11/21/2019  [name] was observed post Covid-19 immunization for 15 minutes without incident. She was provided with Vaccine Information Sheet and instruction to access the V-Safe system.   [name] was instructed to call 911 with any severe reactions post vaccine: Marland Kitchen Difficulty breathing  . Swelling of face and throat  . A fast heartbeat  . A bad rash all over body  . Dizziness and weakness   Immunizations Administered    Name Date Dose VIS Date Route   Pfizer COVID-19 Vaccine 11/21/2019  8:10 AM 0.3 mL 08/01/2019 Intramuscular   Manufacturer: [employer], [employer]   Lot: [id number]   [place]: [id number]

## 2019-11-25 DIAGNOSIS — K648 Other hemorrhoids: Secondary | ICD-10-CM | POA: Diagnosis not present

## 2019-11-25 DIAGNOSIS — K449 Diaphragmatic hernia without obstruction or gangrene: Secondary | ICD-10-CM | POA: Diagnosis not present

## 2019-11-25 DIAGNOSIS — K5730 Diverticulosis of large intestine without perforation or abscess without bleeding: Secondary | ICD-10-CM | POA: Diagnosis not present

## 2019-11-25 DIAGNOSIS — Z1211 Encounter for screening for malignant neoplasm of colon: Secondary | ICD-10-CM | POA: Diagnosis not present

## 2019-12-09 DIAGNOSIS — K224 Dyskinesia of esophagus: Secondary | ICD-10-CM | POA: Diagnosis not present

## 2019-12-09 DIAGNOSIS — K228 Other specified diseases of esophagus: Secondary | ICD-10-CM | POA: Diagnosis not present

## 2019-12-15 ENCOUNTER — Ambulatory Visit: Payer: Medicare Other | Attending: Internal Medicine

## 2019-12-15 DIAGNOSIS — Z23 Encounter for immunization: Secondary | ICD-10-CM

## 2019-12-15 DIAGNOSIS — Z1159 Encounter for screening for other viral diseases: Secondary | ICD-10-CM | POA: Diagnosis not present

## 2019-12-15 DIAGNOSIS — E1165 Type 2 diabetes mellitus with hyperglycemia: Secondary | ICD-10-CM | POA: Diagnosis not present

## 2019-12-15 DIAGNOSIS — E7800 Pure hypercholesterolemia, unspecified: Secondary | ICD-10-CM | POA: Diagnosis not present

## 2019-12-15 DIAGNOSIS — E559 Vitamin D deficiency, unspecified: Secondary | ICD-10-CM | POA: Diagnosis not present

## 2019-12-15 DIAGNOSIS — I10 Essential (primary) hypertension: Secondary | ICD-10-CM | POA: Diagnosis not present

## 2019-12-15 NOTE — Progress Notes (Signed)
   Covid-19 Vaccination Clinic  Name:  Kendra Miles    MRN: [id number] DOB: 1950/12/15  12/15/2019  Kendra Miles was observed post Covid-19 immunization for 15 minutes without incident. She was provided with Vaccine Information Sheet and instruction to access the V-Safe system.   Kendra Miles was instructed to call 911 with any severe reactions post vaccine: Marland Kitchen Difficulty breathing  . Swelling of face and throat  . A fast heartbeat  . A bad rash all over body  . Dizziness and weakness   Immunizations Administered    Name Date Dose VIS Date Route   Pfizer COVID-19 Vaccine 12/15/2019  2:29 PM 0.3 mL 10/15/2018 Intramuscular   Manufacturer: [place]   Lot: [id number]   [place]: [id number]    .co

## 2019-12-23 DIAGNOSIS — K449 Diaphragmatic hernia without obstruction or gangrene: Secondary | ICD-10-CM | POA: Diagnosis not present

## 2019-12-29 ENCOUNTER — Other Ambulatory Visit: Payer: Self-pay | Admitting: Gastroenterology

## 2019-12-29 DIAGNOSIS — K449 Diaphragmatic hernia without obstruction or gangrene: Secondary | ICD-10-CM

## 2020-01-02 DIAGNOSIS — E1165 Type 2 diabetes mellitus with hyperglycemia: Secondary | ICD-10-CM | POA: Diagnosis not present

## 2020-01-02 DIAGNOSIS — M25511 Pain in right shoulder: Secondary | ICD-10-CM | POA: Diagnosis not present

## 2020-01-02 DIAGNOSIS — J3089 Other allergic rhinitis: Secondary | ICD-10-CM | POA: Diagnosis not present

## 2020-01-02 DIAGNOSIS — M25561 Pain in right knee: Secondary | ICD-10-CM | POA: Diagnosis not present

## 2020-01-13 ENCOUNTER — Ambulatory Visit
Admission: RE | Admit: 2020-01-13 | Discharge: 2020-01-13 | Disposition: A | Discharge status: Home / Self Care | Payer: Medicare Other | Source: Ambulatory Visit | Attending: Gastroenterology | Admitting: Gastroenterology

## 2020-01-13 DIAGNOSIS — R1110 Vomiting, unspecified: Secondary | ICD-10-CM | POA: Diagnosis not present

## 2020-01-13 DIAGNOSIS — K449 Diaphragmatic hernia without obstruction or gangrene: Secondary | ICD-10-CM | POA: Diagnosis not present

## 2020-01-13 MED ORDER — IOPAMIDOL (ISOVUE-300) INJECTION 61%
100.0000 mL | Freq: Once | INTRAVENOUS | Status: AC | PRN
  Administered 2020-01-13: 100 mL via INTRAVENOUS

## 2020-02-03 DIAGNOSIS — R0602 Shortness of breath: Secondary | ICD-10-CM | POA: Diagnosis not present

## 2020-02-03 DIAGNOSIS — F329 Major depressive disorder, single episode, unspecified: Secondary | ICD-10-CM | POA: Diagnosis not present

## 2020-02-03 DIAGNOSIS — F419 Anxiety disorder, unspecified: Secondary | ICD-10-CM | POA: Diagnosis not present

## 2020-02-03 DIAGNOSIS — R942 Abnormal results of pulmonary function studies: Secondary | ICD-10-CM | POA: Diagnosis not present

## 2020-02-03 DIAGNOSIS — Z0000 Encounter for general adult medical examination without abnormal findings: Secondary | ICD-10-CM | POA: Diagnosis not present

## 2020-03-12 DIAGNOSIS — B351 Tinea unguium: Secondary | ICD-10-CM | POA: Diagnosis not present

## 2020-03-12 DIAGNOSIS — E119 Type 2 diabetes mellitus without complications: Secondary | ICD-10-CM | POA: Diagnosis not present

## 2020-03-12 DIAGNOSIS — M79675 Pain in left toe(s): Secondary | ICD-10-CM | POA: Diagnosis not present

## 2020-03-12 DIAGNOSIS — L600 Ingrowing nail: Secondary | ICD-10-CM | POA: Diagnosis not present

## 2020-03-15 DIAGNOSIS — E7800 Pure hypercholesterolemia, unspecified: Secondary | ICD-10-CM | POA: Diagnosis not present

## 2020-03-15 DIAGNOSIS — I10 Essential (primary) hypertension: Secondary | ICD-10-CM | POA: Diagnosis not present

## 2020-03-15 DIAGNOSIS — E559 Vitamin D deficiency, unspecified: Secondary | ICD-10-CM | POA: Diagnosis not present

## 2020-03-15 DIAGNOSIS — E1165 Type 2 diabetes mellitus with hyperglycemia: Secondary | ICD-10-CM | POA: Diagnosis not present

## 2020-04-14 DIAGNOSIS — L0292 Furuncle, unspecified: Secondary | ICD-10-CM | POA: Diagnosis not present

## 2020-05-28 DIAGNOSIS — R059 Cough, unspecified: Secondary | ICD-10-CM | POA: Diagnosis not present

## 2020-05-28 DIAGNOSIS — R0602 Shortness of breath: Secondary | ICD-10-CM | POA: Diagnosis not present

## 2020-05-28 DIAGNOSIS — J069 Acute upper respiratory infection, unspecified: Secondary | ICD-10-CM | POA: Diagnosis not present

## 2020-05-28 DIAGNOSIS — R519 Headache, unspecified: Secondary | ICD-10-CM | POA: Diagnosis not present

## 2020-06-21 DIAGNOSIS — E559 Vitamin D deficiency, unspecified: Secondary | ICD-10-CM | POA: Diagnosis not present

## 2020-06-21 DIAGNOSIS — Z23 Encounter for immunization: Secondary | ICD-10-CM | POA: Diagnosis not present

## 2020-06-21 DIAGNOSIS — E7800 Pure hypercholesterolemia, unspecified: Secondary | ICD-10-CM | POA: Diagnosis not present

## 2020-06-21 DIAGNOSIS — I10 Essential (primary) hypertension: Secondary | ICD-10-CM | POA: Diagnosis not present

## 2020-06-21 DIAGNOSIS — E1165 Type 2 diabetes mellitus with hyperglycemia: Secondary | ICD-10-CM | POA: Diagnosis not present

## 2020-06-21 DIAGNOSIS — M25551 Pain in right hip: Secondary | ICD-10-CM | POA: Diagnosis not present

## 2020-06-29 DIAGNOSIS — D132 Benign neoplasm of duodenum: Secondary | ICD-10-CM | POA: Diagnosis not present

## 2020-06-29 DIAGNOSIS — R1010 Upper abdominal pain, unspecified: Secondary | ICD-10-CM | POA: Diagnosis not present

## 2020-07-26 DIAGNOSIS — Z20822 Contact with and (suspected) exposure to covid-19: Secondary | ICD-10-CM | POA: Diagnosis not present

## 2020-07-26 DIAGNOSIS — J310 Chronic rhinitis: Secondary | ICD-10-CM | POA: Diagnosis not present

## 2020-07-27 DIAGNOSIS — K449 Diaphragmatic hernia without obstruction or gangrene: Secondary | ICD-10-CM | POA: Diagnosis not present

## 2020-07-30 IMAGING — MG DIGITAL SCREENING BILAT W/ TOMO W/ CAD
8 series · 8 of 24 positions shown · non-contrast
Comparison: Previous exam(s).

CLINICAL DATA: Screening.

EXAM:
DIGITAL SCREENING BILATERAL MAMMOGRAM WITH TOMO AND CAD

[R CC synth-2D]
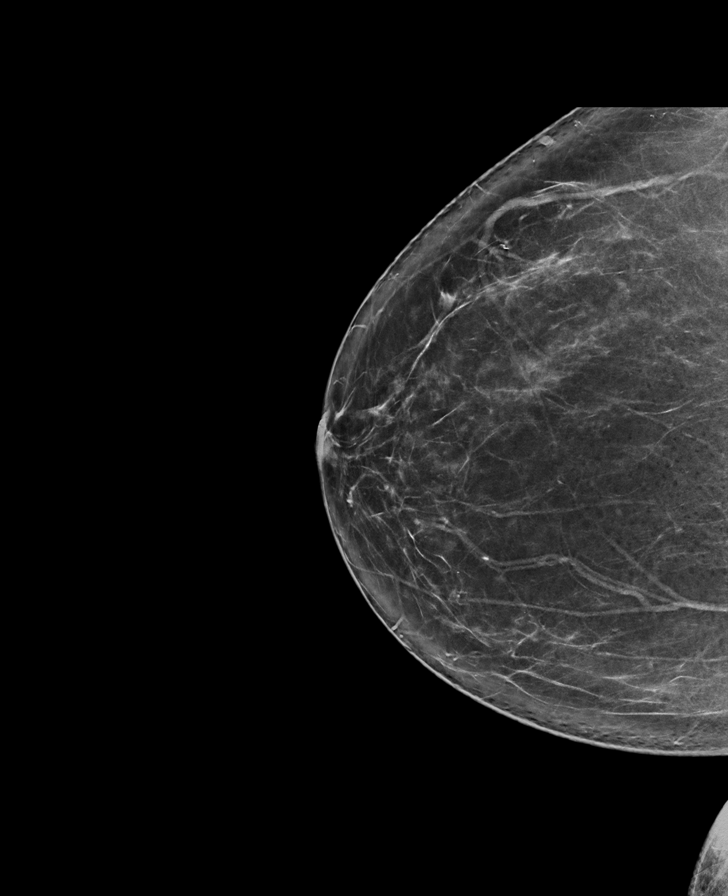

[L CC synth-2D]
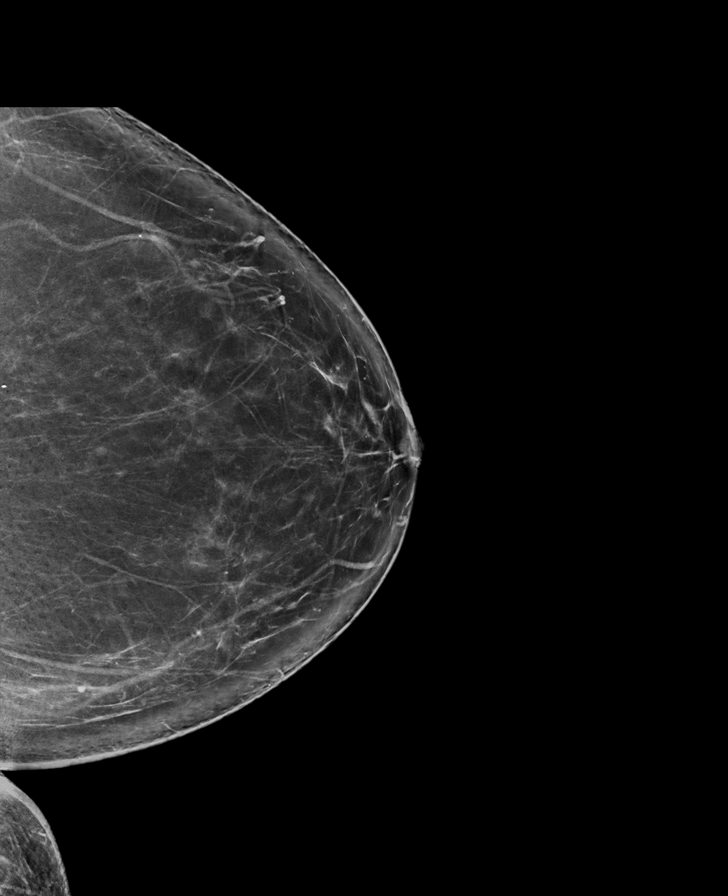

[L MLO synth-2D]
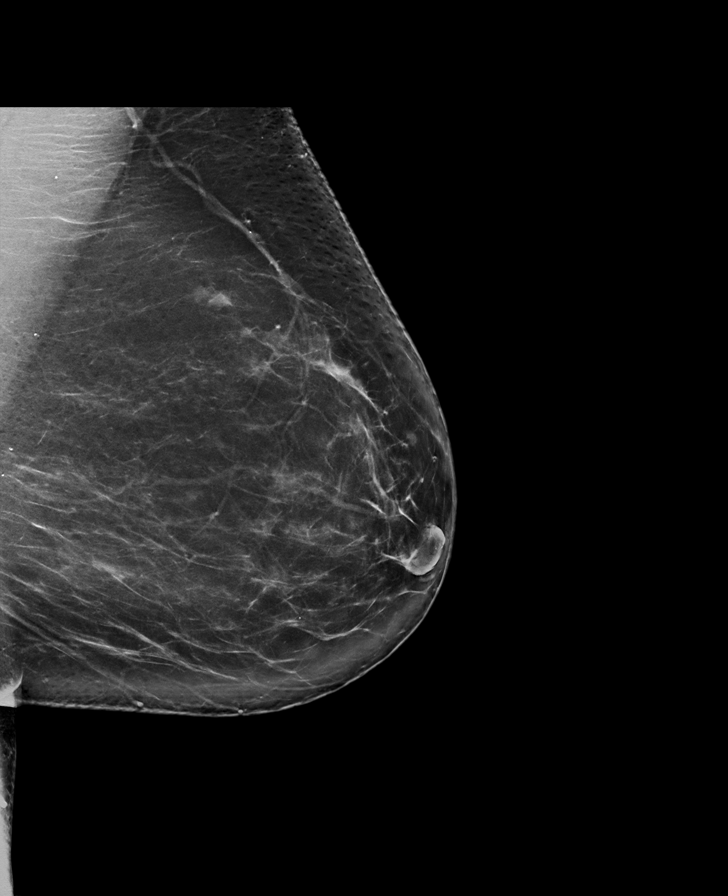

[R MLO synth-2D]
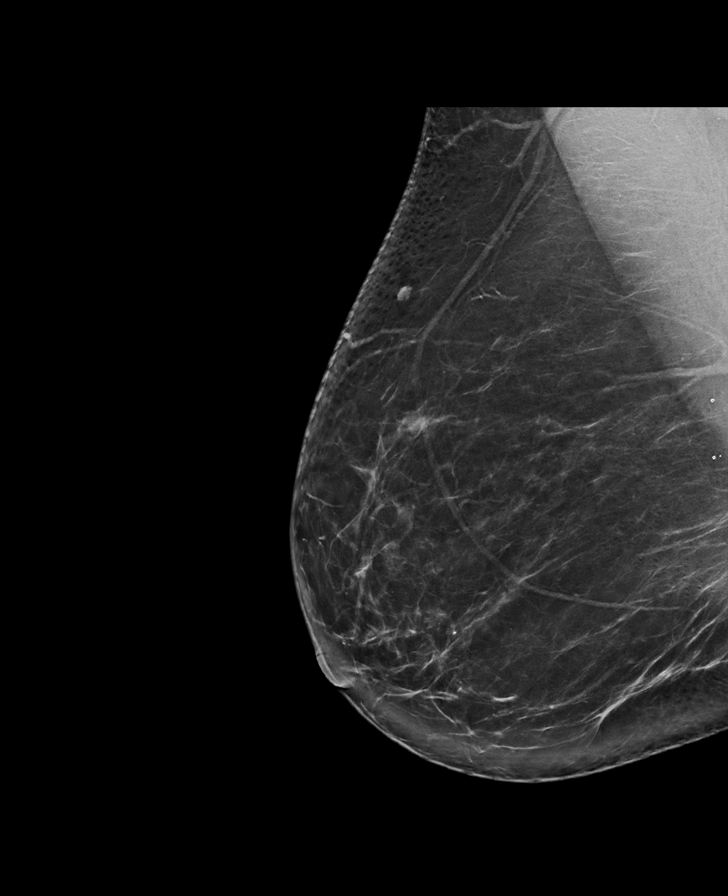

[L CC tomo · tomo slice 41/82.0]
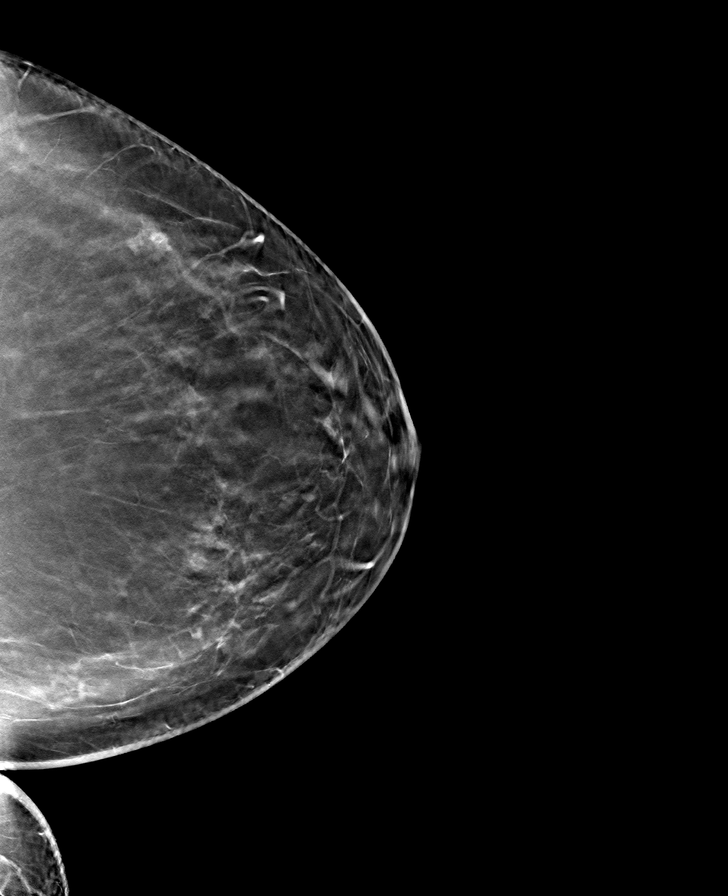

[R MLO tomo · tomo slice 47/92.0]
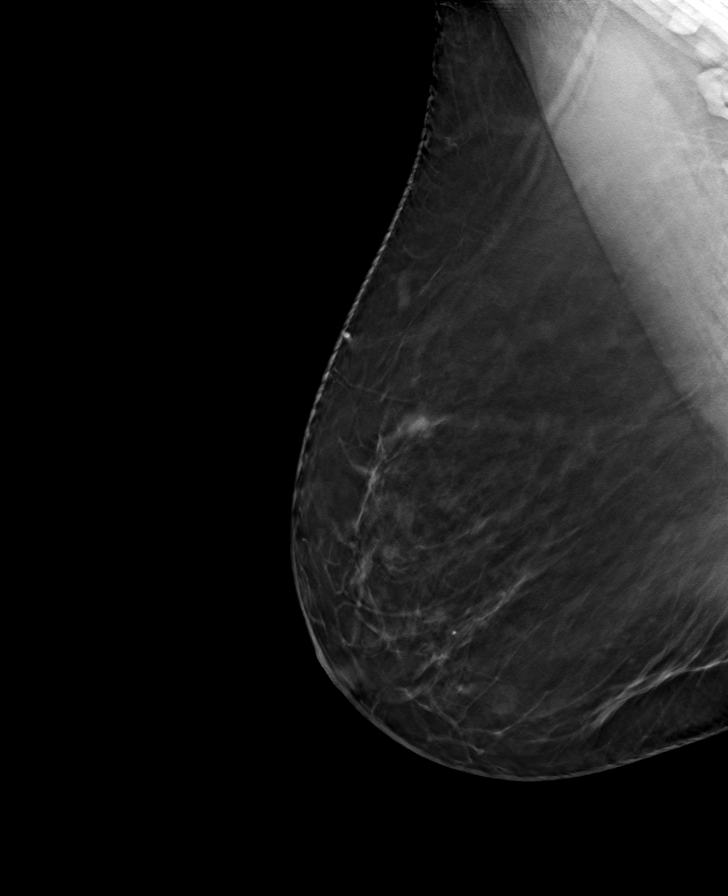

[R CC tomo · tomo slice 41/80.0]
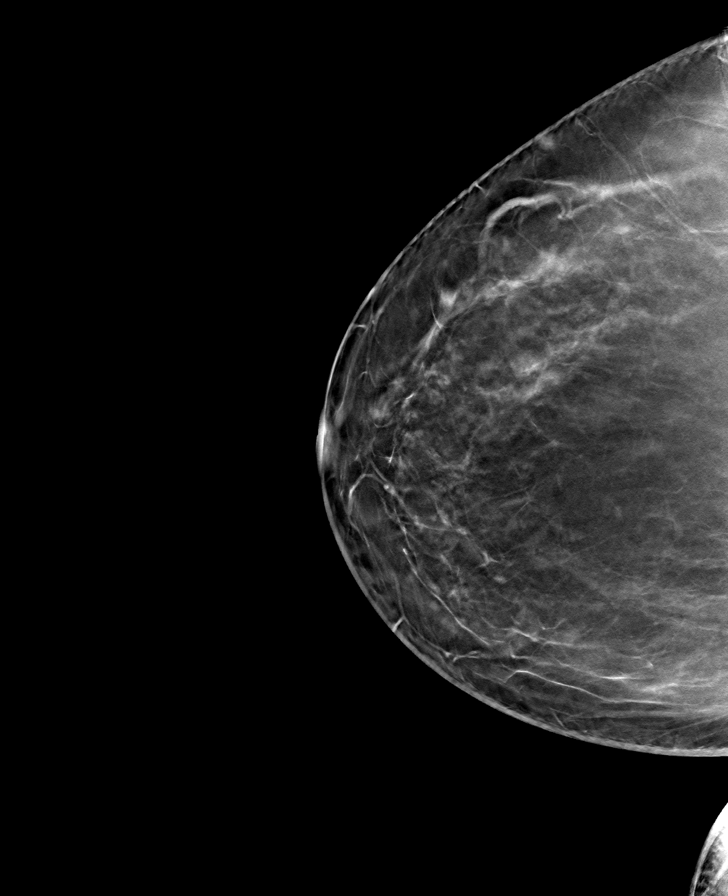

[L MLO tomo · tomo slice 45/90.0]
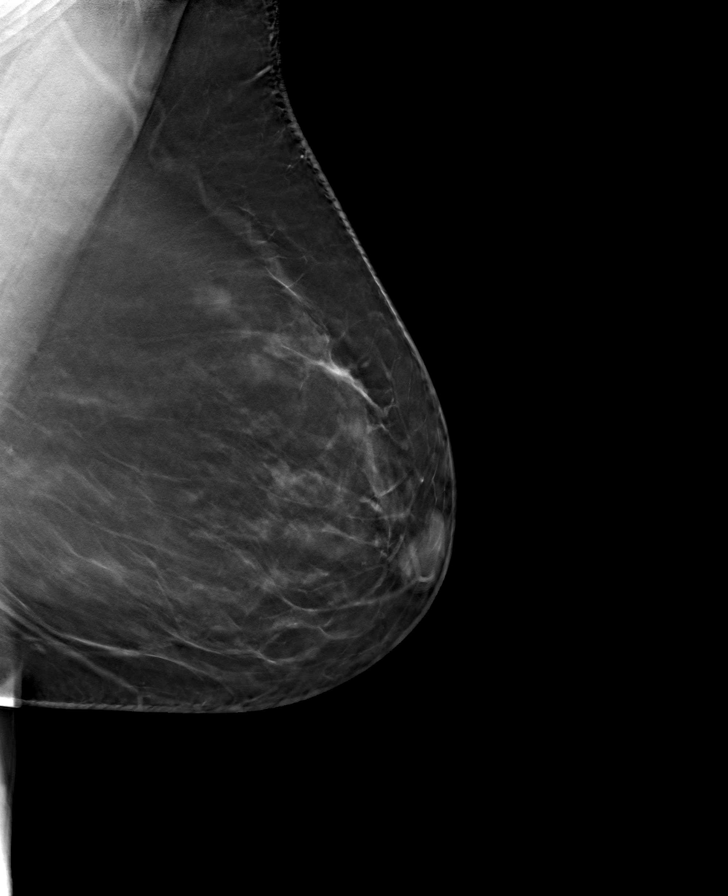

[8 of 24 positions shown; findings below may reference images not displayed]

ACR Breast Density Category b: There are scattered areas of
fibroglandular density.
FINDINGS: There are no findings suspicious for malignancy. Images were
processed with CAD.
IMPRESSION: No mammographic evidence of malignancy. A result letter of this
screening mammogram will be mailed directly to the patient.

RECOMMENDATION:
Screening mammogram in one year. (Code:[id number])

BI-RADS CATEGORY  1: Negative.

## 2020-08-30 DIAGNOSIS — M79662 Pain in left lower leg: Secondary | ICD-10-CM | POA: Diagnosis not present

## 2020-08-30 DIAGNOSIS — M79661 Pain in right lower leg: Secondary | ICD-10-CM | POA: Diagnosis not present

## 2020-08-30 DIAGNOSIS — R9431 Abnormal electrocardiogram [ECG] [EKG]: Secondary | ICD-10-CM | POA: Diagnosis not present

## 2020-08-30 DIAGNOSIS — R0602 Shortness of breath: Secondary | ICD-10-CM | POA: Diagnosis not present

## 2020-08-30 DIAGNOSIS — R0989 Other specified symptoms and signs involving the circulatory and respiratory systems: Secondary | ICD-10-CM | POA: Diagnosis not present

## 2020-09-09 DIAGNOSIS — E119 Type 2 diabetes mellitus without complications: Secondary | ICD-10-CM | POA: Diagnosis not present

## 2020-09-09 DIAGNOSIS — I10 Essential (primary) hypertension: Secondary | ICD-10-CM | POA: Diagnosis not present

## 2020-09-09 DIAGNOSIS — R0602 Shortness of breath: Secondary | ICD-10-CM | POA: Diagnosis not present

## 2020-09-09 DIAGNOSIS — E7800 Pure hypercholesterolemia, unspecified: Secondary | ICD-10-CM | POA: Diagnosis not present

## 2020-09-27 DIAGNOSIS — E7800 Pure hypercholesterolemia, unspecified: Secondary | ICD-10-CM | POA: Diagnosis not present

## 2020-09-27 DIAGNOSIS — I10 Essential (primary) hypertension: Secondary | ICD-10-CM | POA: Diagnosis not present

## 2020-09-27 DIAGNOSIS — E119 Type 2 diabetes mellitus without complications: Secondary | ICD-10-CM | POA: Diagnosis not present

## 2020-09-27 DIAGNOSIS — E559 Vitamin D deficiency, unspecified: Secondary | ICD-10-CM | POA: Diagnosis not present

## 2020-09-27 DIAGNOSIS — Z79899 Other long term (current) drug therapy: Secondary | ICD-10-CM | POA: Diagnosis not present

## 2020-09-27 DIAGNOSIS — Z0000 Encounter for general adult medical examination without abnormal findings: Secondary | ICD-10-CM | POA: Diagnosis not present

## 2020-09-27 DIAGNOSIS — R5383 Other fatigue: Secondary | ICD-10-CM | POA: Diagnosis not present

## 2020-10-04 DIAGNOSIS — M79661 Pain in right lower leg: Secondary | ICD-10-CM | POA: Diagnosis not present

## 2020-10-04 DIAGNOSIS — R0602 Shortness of breath: Secondary | ICD-10-CM | POA: Diagnosis not present

## 2020-10-04 DIAGNOSIS — M79662 Pain in left lower leg: Secondary | ICD-10-CM | POA: Diagnosis not present

## 2020-10-04 DIAGNOSIS — R0989 Other specified symptoms and signs involving the circulatory and respiratory systems: Secondary | ICD-10-CM | POA: Diagnosis not present

## 2020-10-26 DIAGNOSIS — Z23 Encounter for immunization: Secondary | ICD-10-CM | POA: Diagnosis not present

## 2020-11-02 ENCOUNTER — Other Ambulatory Visit: Payer: Self-pay

## 2020-11-02 ENCOUNTER — Ambulatory Visit (INDEPENDENT_AMBULATORY_CARE_PROVIDER_SITE_OTHER): Payer: Medicare Other | Admitting: Nurse Practitioner

## 2020-11-02 ENCOUNTER — Other Ambulatory Visit (HOSPITAL_BASED_OUTPATIENT_CLINIC_OR_DEPARTMENT_OTHER): Payer: Self-pay

## 2020-11-02 ENCOUNTER — Encounter (HOSPITAL_BASED_OUTPATIENT_CLINIC_OR_DEPARTMENT_OTHER): Payer: Self-pay | Admitting: Nurse Practitioner

## 2020-11-02 ENCOUNTER — Other Ambulatory Visit (HOSPITAL_BASED_OUTPATIENT_CLINIC_OR_DEPARTMENT_OTHER)
Admission: RE | Admit: 2020-11-02 | Discharge: 2020-11-02 | Disposition: A | Discharge status: Home / Self Care | Payer: Medicare Other | Source: Ambulatory Visit | Attending: Nurse Practitioner | Admitting: Nurse Practitioner

## 2020-11-02 VITALS — BP 104/80 | HR 69 | Temp 98.6°F | Resp 12 | Ht 66.0 in | Wt 187.0 lb

## 2020-11-02 DIAGNOSIS — Z7689 Persons encountering health services in other specified circumstances: Secondary | ICD-10-CM | POA: Diagnosis not present

## 2020-11-02 DIAGNOSIS — N951 Menopausal and female climacteric states: Secondary | ICD-10-CM

## 2020-11-02 DIAGNOSIS — G8929 Other chronic pain: Secondary | ICD-10-CM | POA: Insufficient documentation

## 2020-11-02 DIAGNOSIS — E559 Vitamin D deficiency, unspecified: Secondary | ICD-10-CM | POA: Insufficient documentation

## 2020-11-02 DIAGNOSIS — J301 Allergic rhinitis due to pollen: Secondary | ICD-10-CM

## 2020-11-02 DIAGNOSIS — M549 Dorsalgia, unspecified: Secondary | ICD-10-CM | POA: Insufficient documentation

## 2020-11-02 DIAGNOSIS — E041 Nontoxic single thyroid nodule: Secondary | ICD-10-CM

## 2020-11-02 DIAGNOSIS — I10 Essential (primary) hypertension: Secondary | ICD-10-CM

## 2020-11-02 DIAGNOSIS — E1165 Type 2 diabetes mellitus with hyperglycemia: Secondary | ICD-10-CM | POA: Diagnosis not present

## 2020-11-02 DIAGNOSIS — IMO0002 Reserved for concepts with insufficient information to code with codable children: Secondary | ICD-10-CM | POA: Insufficient documentation

## 2020-11-02 DIAGNOSIS — E782 Mixed hyperlipidemia: Secondary | ICD-10-CM | POA: Diagnosis not present

## 2020-11-02 DIAGNOSIS — Z794 Long term (current) use of insulin: Secondary | ICD-10-CM | POA: Insufficient documentation

## 2020-11-02 DIAGNOSIS — E58 Dietary calcium deficiency: Secondary | ICD-10-CM

## 2020-11-02 DIAGNOSIS — K219 Gastro-esophageal reflux disease without esophagitis: Secondary | ICD-10-CM

## 2020-11-02 DIAGNOSIS — L570 Actinic keratosis: Secondary | ICD-10-CM | POA: Diagnosis not present

## 2020-11-02 DIAGNOSIS — E042 Nontoxic multinodular goiter: Secondary | ICD-10-CM | POA: Diagnosis not present

## 2020-11-02 DIAGNOSIS — R600 Localized edema: Secondary | ICD-10-CM | POA: Insufficient documentation

## 2020-11-02 DIAGNOSIS — Z1231 Encounter for screening mammogram for malignant neoplasm of breast: Secondary | ICD-10-CM

## 2020-11-02 DIAGNOSIS — R519 Headache, unspecified: Secondary | ICD-10-CM | POA: Insufficient documentation

## 2020-11-02 DIAGNOSIS — R7989 Other specified abnormal findings of blood chemistry: Secondary | ICD-10-CM

## 2020-11-02 DIAGNOSIS — Z6830 Body mass index (BMI) 30.0-30.9, adult: Secondary | ICD-10-CM

## 2020-11-02 HISTORY — DX: Dorsalgia, unspecified: M54.9

## 2020-11-02 HISTORY — DX: Dietary calcium deficiency: E58

## 2020-11-02 HISTORY — DX: Menopausal and female climacteric states: N95.1

## 2020-11-02 HISTORY — DX: Localized edema: R60.0

## 2020-11-02 HISTORY — DX: Persons encountering health services in other specified circumstances: Z76.89

## 2020-11-02 LAB — LIPID PANEL
Cholesterol: 174 mg/dL (ref 0–200)
HDL: 37 mg/dL — ABNORMAL LOW (ref >40)
LDL Cholesterol: 99 mg/dL (ref 0–99)
Total CHOL/HDL Ratio: 4.7 RATIO
Triglycerides: 188 mg/dL — ABNORMAL HIGH (ref <150)
VLDL: 38 mg/dL (ref 0–40)

## 2020-11-02 LAB — CBC WITH DIFFERENTIAL/PLATELET
Abs Immature Granulocytes: 0.01 10*3/uL (ref 0.00–0.07)
Basophils Absolute: 0 10*3/uL (ref 0.0–0.1)
Basophils Relative: 0 %
Eosinophils Absolute: 0.2 10*3/uL (ref 0.0–0.5)
Eosinophils Relative: 3 %
HCT: 45.4 % (ref 36.0–46.0)
Hemoglobin: 15.8 g/dL — ABNORMAL HIGH (ref 12.0–15.0)
Immature Granulocytes: 0 %
Lymphocytes Relative: 27 %
Lymphs Abs: 1.9 10*3/uL (ref 0.7–4.0)
MCH: 30.4 pg (ref 26.0–34.0)
MCHC: 34.8 g/dL (ref 30.0–36.0)
MCV: 87.5 fL (ref 80.0–100.0)
Monocytes Absolute: 0.5 10*3/uL (ref 0.1–1.0)
Monocytes Relative: 7 %
Neutro Abs: 4.5 10*3/uL (ref 1.7–7.7)
Neutrophils Relative %: 63 %
Platelets: 299 10*3/uL (ref 150–400)
RBC: 5.19 MIL/uL — ABNORMAL HIGH (ref 3.87–5.11)
RDW: 12.7 % (ref 11.5–15.5)
WBC: 7.2 10*3/uL (ref 4.0–10.5)
nRBC: 0 % (ref 0.0–0.2)

## 2020-11-02 LAB — COMPREHENSIVE METABOLIC PANEL
ALT: 16 U/L (ref 0–44)
AST: 15 U/L (ref 15–41)
Albumin: 4.2 g/dL (ref 3.5–5.0)
Alkaline Phosphatase: 89 U/L (ref 38–126)
Anion gap: 9 (ref 5–15)
BUN: 15 mg/dL (ref 8–23)
CO2: 28 mmol/L (ref 22–32)
Calcium: 9.1 mg/dL (ref 8.9–10.3)
Chloride: 103 mmol/L (ref 98–111)
Creatinine, Ser: 0.73 mg/dL (ref 0.44–1.00)
GFR, Estimated: >60 mL/min (ref >60)
Glucose, Bld: 222 mg/dL — ABNORMAL HIGH (ref 70–99)
Potassium: 4.1 mmol/L (ref 3.5–5.1)
Sodium: 140 mmol/L (ref 135–145)
Total Bilirubin: 0.8 mg/dL (ref 0.3–1.2)
Total Protein: 6 g/dL — ABNORMAL LOW (ref 6.5–8.1)

## 2020-11-02 LAB — POCT URINALYSIS DIPSTICK
Bilirubin, UA: NEGATIVE
Blood, UA: NEGATIVE
Glucose, UA: POSITIVE — AB
Ketones, UA: NEGATIVE
Nitrite, UA: NEGATIVE
Protein, UA: NEGATIVE
Spec Grav, UA: 1.025 (ref 1.010–1.025)
Urobilinogen, UA: 1 E.U./dL
pH, UA: 6 (ref 5.0–8.0)

## 2020-11-02 LAB — POCT GLYCOSYLATED HEMOGLOBIN (HGB A1C): Hemoglobin A1C: 9.5 % — AB (ref 4.0–5.6)

## 2020-11-02 LAB — HEMOGLOBIN A1C
Hgb A1c MFr Bld: 9.5 % — ABNORMAL HIGH (ref 4.8–5.6)
Mean Plasma Glucose: 225.95 mg/dL

## 2020-11-02 LAB — POCT UA - MICROALBUMIN
Albumin/Creatinine Ratio, Urine, POC: <30
Creatinine, POC: 200 mg/dL
Microalbumin Ur, POC: 10 mg/L

## 2020-11-02 LAB — VITAMIN D 25 HYDROXY (VIT D DEFICIENCY, FRACTURES): Vit D, 25-Hydroxy: 41.7 ng/mL (ref 30–100)

## 2020-11-02 MED ORDER — ROSUVASTATIN CALCIUM 5 MG PO TABS
5.0000 mg | ORAL_TABLET | Freq: Every day | ORAL | 1 refills | Status: DC
  Filled 2020-11-02: qty 90, 90d supply, fill #0

## 2020-11-02 MED ORDER — ASPIRIN 81 MG PO CHEW
81.0000 mg | CHEWABLE_TABLET | Freq: Every day | ORAL | 1 refills | Status: DC
  Filled 2020-11-02: qty 90, 90d supply, fill #0

## 2020-11-02 MED ORDER — BLOOD GLUCOSE METER KIT
PACK | 99 refills | Status: AC
  Filled 2020-11-02: qty 1, 30d supply, fill #0

## 2020-11-02 MED ORDER — PAROXETINE HCL 20 MG PO TABS
20.0000 mg | ORAL_TABLET | Freq: Every day | ORAL | 1 refills | Status: DC
  Filled 2020-11-02: qty 90, 90d supply, fill #0

## 2020-11-02 MED ORDER — METFORMIN HCL 500 MG PO TABS
500.0000 mg | ORAL_TABLET | Freq: Two times a day (BID) | ORAL | 1 refills | Status: DC
  Filled 2020-11-02: qty 180, 90d supply, fill #0

## 2020-11-02 MED ORDER — NUTRADERM EX LOTN
TOPICAL_LOTION | Freq: Two times a day (BID) | CUTANEOUS | 1 refills | Status: DC | PRN
  Filled 2020-11-02: qty 240, fill #0

## 2020-11-02 MED ORDER — CALCIUM CARBONATE 1500 (600 CA) MG PO TABS
600.0000 mg | ORAL_TABLET | Freq: Every day | ORAL | 1 refills | Status: AC
  Filled 2020-11-02: qty 90, 225d supply, fill #0

## 2020-11-02 MED ORDER — FAMOTIDINE 20 MG PO TABS
20.0000 mg | ORAL_TABLET | Freq: Two times a day (BID) | ORAL | 1 refills | Status: DC
  Filled 2020-11-02: qty 180, 90d supply, fill #0

## 2020-11-02 MED ORDER — LISINOPRIL 5 MG PO TABS
5.0000 mg | ORAL_TABLET | Freq: Every day | ORAL | 1 refills | Status: DC
  Filled 2020-11-02: qty 90, 90d supply, fill #0

## 2020-11-02 MED ORDER — LORATADINE 10 MG PO TABS
10.0000 mg | ORAL_TABLET | Freq: Every day | ORAL | 1 refills | Status: DC
  Filled 2020-11-02: qty 100, 100d supply, fill #0

## 2020-11-02 NOTE — Patient Instructions (Addendum)
Thank you for choosing [place] at [facility] for your Primary Care needs. I am excited for the opportunity to partner with you to meet your health care goals. It was a pleasure meeting you today.   For your information, our office hours are Monday- Friday 8:00 AM - 5:00 PM At this time I am not in the office on Wednesdays.  If you have any needs, please feel free to send a MyChart message or call our office at [phone] and we will respond as quickly as possible.   If you had labs drawn today for your visit, you will receive notification on MyChart as soon as the labs have resulted. You will see these results before me. I ask that you please allow 72 business hours for me to view the results. I will send you a MyChart message with lab results and if there are abnormalities that we need to discuss, you will receive a phone call from the office. If you have not heard anything within 72 hours through MyChart or a phone call, please feel free to contact the office to let us know.   I typically respond to MyChart messages within 24 hours during the work week.  Most messages must be routed to me even when the message is selected directly for me. This may cause a delay in my response. Your needs are very important to me, if you need a faster response, please feel free to call the office and a message will be directed to me.  Due to the routing process, messages sent over the weekend may not get to me until Monday.   If you have non-urgent medical needs after hours, please call the office line and speak to our on-call provider. We share on-call services with another [place] office, therefore, you may receive a return call from a provider outside of this office.   If you have urgent medical needs after hours, please seek care in an Urgent Care or Emergency Room. Our building has a 24 hour emergency department on the 1st floor for your convenience.   Please do not  hesitate to reach out to [place] with concerns.   Thank you, again, for choosing me as your health care partner. I appreciate your trust and look forward to learning more about you.   [name], DNP, AGNP-c  _________________________________________________________  We will get your basic lab work today of:  CBC- complete blood count- to measure your red blood cell and white blood cells. This looks for signs of anemia and infection.   CMP- complete metabolic panel - to measure your kidney function, your liver function, your blood sugar, your electrolytes (potassium, sodium, etc). This looks for any signs of decreased function or imbalance.   Lipids- to measure your cholesterol. This looks to make sure your cholesterol medication is working well and that we don't need to make any adjustments.   Hemoglobin A1c- this looks at your average blood sugar over the past three months to make sure that your blood sugars are well controlled and we don't need to make any changes.   Vitamin D- this looks to make sure that your vitamin D levels are regulated and we don't need to make any changes to how much vitamin D you are taking, which is very important to bone health.   TSH- thyroid stimulating hormone- this looks to make sure your thyroid is functioning properly and your don't have any signs of overactive or underactive  thyroid. Having a history of nodules on the thyroid, we want to keep an eye on this for any changes.   _____________________________________________________________  I recommend that all women over 73 have the following health maintenance:  Mammogram every 1-2 years or more frequently based on results Colonoscopy every 10 years or more frequently based on results DEXA (bone density scan)- how often is based on results  I recommend the following vaccines: Pneumonia 34- covers 23 of the most common causes of pneumonia in adults. Having diabetes makes you higher risk for  complications from pneumonia.  Pneumonia 13- usually given 12 months after the other pneumonia vaccine to protect you against 13 additional common causes of pneumonia in adults.  Shingrix- protects you against Shingles which are caused from the chicken pox virus that remains in your body for your lifespan and can reactivate to create painful blisters and rash.  Flu- recommended every fall to help prevent you from the flu virus. This can be more dangerous to people with chronic conditions like diabetes and high blood pressure and older adults.  COVID-19- this helps protect you against COVID-19. I recommend that all adults over the age of 83 have the initial first 2 doses with a booster dose 6 months after the last dose. The dosing on this may change over time based on new findings from the virus, so we will keep you updated on these changes.   _______________________________________________________________  I recommend a diet low in saturated fats, salt, and carbohydrates (sugars, breads, potatoes, etc) for all patients with diabetes, high cholesterol, and high blood pressure. Your hemoglobin A1c was up to 9.5% today, but you have been off of your medication so we were expecting that. Lets plan to restart this and then we will follow-up in about 3 months to see if it is looking any better.   I recommend physical activity of at least 20 minutes every day. Taking a walk is a great way to help get in your physical activity and improve your mood and overall health. If you don't usually do physical activity, I recommend that you start slow and work to increase your time and speed. This doesn't have to be something that wears you out, but just gets you up and moving.  Other activities than can be helpful are water aerobics, especially if you have pain in your joints that causes walking to be difficult.   ________________________________________________________________ If you have any questions about any of  the recommendations, please feel free to reach out to me and I will be happy to help in any way I can.   I will review your records from [place] and we let you know if we need to change anything.   ________________________________________________________________  I have sent refills of your medication to the pharmacy downstairs. Please let me know if you have any concerns or difficulty with the medications.  ________________________________________________________________   Here are some recommendations and information on foods that are helpful for diabetes.   https://www.diabeteseducator.org/docs/default-source/living-with-diabetes/conquering-the-grocery-store-v1.pdf?sfvrsn=4">  Carbohydrate Counting for Diabetes Mellitus, Adult Carbohydrate counting is a method of keeping track of how many carbohydrates you eat. Eating carbohydrates naturally increases the amount of sugar (glucose) in the blood. Counting how many carbohydrates you eat improves your blood glucose control, which helps you manage your diabetes. It is important to know how many carbohydrates you can safely have in each meal. This is different for every person. A dietitian can help you make a meal plan and calculate how many carbohydrates you  should have at each meal and snack. What foods contain carbohydrates? Carbohydrates are found in the following foods:  Grains, such as breads and cereals.  Dried beans and soy products.  Starchy vegetables, such as potatoes, peas, and corn.  Fruit and fruit juices.  Milk and yogurt.  Sweets and snack foods, such as cake, cookies, candy, chips, and soft drinks.   How do I count carbohydrates in foods? There are two ways to count carbohydrates in food. You can read food labels or learn standard serving sizes of foods. You can use either of the methods or a combination of both. Using the Nutrition Facts label The Nutrition Facts list is included on the labels of almost all packaged  foods and beverages in the [place] It includes:  The serving size.  Information about nutrients in each serving, including the grams (g) of carbohydrate per serving. To use the Nutrition Facts:  Decide how many servings you will have.  Multiply the number of servings by the number of carbohydrates per serving.  The resulting number is the total amount of carbohydrates that you will be having. Learning the standard serving sizes of foods When you eat carbohydrate foods that are not packaged or do not include Nutrition Facts on the label, you need to measure the servings in order to count the amount of carbohydrates.  Measure the foods that you will eat with a food scale or measuring cup, if needed.  Decide how many standard-size servings you will eat.  Multiply the number of servings by 15. For foods that contain carbohydrates, one serving equals 15 g of carbohydrates. ? For example, if you eat 2 cups or 10 oz (300 g) of strawberries, you will have eaten 2 servings and 30 g of carbohydrates (2 servings x 15 g = 30 g).  For foods that have more than one food mixed, such as soups and casseroles, you must count the carbohydrates in each food that is included. The following list contains standard serving sizes of common carbohydrate-rich foods. Each of these servings has about 15 g of carbohydrates:  1 slice of bread.  1 six-inch (15 cm) tortilla.  ? cup or 2 oz (53 g) cooked rice or pasta.   cup or 3 oz (85 g) cooked or canned, drained and rinsed beans or lentils.   cup or 3 oz (85 g) starchy vegetable, such as peas, corn, or squash.   cup or 4 oz (120 g) hot cereal.   cup or 3 oz (85 g) boiled or mashed potatoes, or  or 3 oz (85 g) of a large baked potato.   cup or 4 fl oz (118 mL) fruit juice.  1 cup or 8 fl oz (237 mL) milk.  1 small or 4 oz (106 g) apple.   or 2 oz (63 g) of a medium banana.  1 cup or 5 oz (150 g) strawberries.  3 cups or 1 oz (24 g) popped  popcorn. What is an example of carbohydrate counting? To calculate the number of carbohydrates in this sample meal, follow the steps shown below. Sample meal  3 oz (85 g) chicken breast.  ? cup or 4 oz (106 g) brown rice.   cup or 3 oz (85 g) corn.  1 cup or 8 fl oz (237 mL) milk.  1 cup or 5 oz (150 g) strawberries with sugar-free whipped topping. Carbohydrate calculation 1. Identify the foods that contain carbohydrates: ? Rice. ? Corn. ? Milk. ? Strawberries. 2.  Calculate how many servings you have of each food: ? 2 servings rice. ? 1 serving corn. ? 1 serving milk. ? 1 serving strawberries. 3. Multiply each number of servings by 15 g: ? 2 servings rice x 15 g = 30 g. ? 1 serving corn x 15 g = 15 g. ? 1 serving milk x 15 g = 15 g. ? 1 serving strawberries x 15 g = 15 g. 4. Add together all of the amounts to find the total grams of carbohydrates eaten: ? 30 g + 15 g + 15 g + 15 g = 75 g of carbohydrates total. What are tips for following this plan? Shopping  Develop a meal plan and then make a shopping list.  Buy fresh and frozen vegetables, fresh and frozen fruit, dairy, eggs, beans, lentils, and whole grains.  Look at food labels. Choose foods that have more fiber and less sugar.  Avoid processed foods and foods with added sugars. Meal planning  Aim to have the same amount of carbohydrates at each meal and for each snack time.  Plan to have regular, balanced meals and snacks. Where to find more information  American Diabetes Association: www.diabetes.org  Centers for Disease Control and Prevention: http://www.wolf.info/ Summary  Carbohydrate counting is a method of keeping track of how many carbohydrates you eat.  Eating carbohydrates naturally increases the amount of sugar (glucose) in the blood.  Counting how many carbohydrates you eat improves your blood glucose control, which helps you manage your diabetes.  A dietitian can help you make a meal plan and  calculate how many carbohydrates you should have at each meal and snack. This information is not intended to replace advice given to you by your health care provider. Make sure you discuss any questions you have with your health care provider. Document Revised: 08/07/2019 Document Reviewed: 08/08/2019 Elsevier Patient Education  2021 [place].   Diabetes Mellitus and [place] care is an important part of your health, especially when you have diabetes. Diabetes may cause you to have problems because of poor blood flow (circulation) to your feet and legs, which can cause your skin to:  Become thinner and drier.  Break more easily.  Heal more slowly.  Peel and crack. You may also have nerve damage (neuropathy) in your legs and feet, causing decreased feeling in them. This means that you may not notice minor injuries to your feet that could lead to more serious problems. Noticing and addressing any potential problems [place]ia [place] is the best way to prevent future foot problems. How to care for your feet Foot hygiene  Wash your feet daily with warm water and mild soap. Do not use hot water. Then, pat your feet and the areas between your toes until they are completely dry. Do not soak your feet as this can dry your skin.  Trim your toenails straight across. Do not dig under them or around the cuticle. File the edges of your nails with an emery board or nail file.  Apply a moisturizing lotion or petroleum jelly to the skin on your feet and to dry, brittle toenails. Use lotion that does not contain alcohol and is unscented. Do not apply lotion between your toes.   Shoes and socks  Wear clean socks or stockings every day. Make sure they are not too tight. Do not wear knee-high stockings since they may decrease blood flow to your legs.  Wear shoes that fit properly and have enough cushioning. Always look in  your shoes before you put them on to be sure there are no objects inside.  To break  in new shoes, wear them for just a few hours a day. This prevents injuries on your feet. Wounds, scrapes, corns, and calluses  Check your feet daily for blisters, cuts, bruises, sores, and redness. If you cannot see the bottom of your feet, use a mirror or ask someone for help.  Do not cut corns or calluses or try to remove them with medicine.  If you find a minor scrape, cut, or break in the skin on your feet, keep it and the skin around it clean and dry. You may clean these areas with mild soap and water. Do not clean the area with peroxide, alcohol, or iodine.  If you have a wound, scrape, corn, or callus on your foot, look at it several times a day to make sure it is healing and not infected. Check for: ? Redness, swelling, or pain. ? Fluid or blood. ? Warmth. ? Pus or a bad smell.   General tips  Do not cross your legs. This may decrease blood flow to your feet.  Do not use heating pads or hot water bottles on your feet. They may burn your skin. If you have lost feeling in your feet or legs, you may not know this is happening until it is too late.  Protect your feet from hot and cold by wearing shoes, such as at the beach or on hot pavement.  Schedule a complete foot exam at least once a year (annually) or more often if you have foot problems. Report any cuts, sores, or bruises to your health care provider immediately. Where to find more information  American Diabetes Association: www.diabetes.org  Association of Diabetes Care & Education Specialists: www.diabeteseducator.org Contact a health care provider if:  You have a medical condition that increases your risk of infection and you have any cuts, sores, or bruises on your feet.  You have an injury that is not healing.  You have redness on your legs or feet.  You feel burning or tingling in your legs or feet.  You have pain or cramps in your legs and feet.  Your legs or feet are numb.  Your feet always feel  cold.  You have pain around any toenails. Get help right away if:  You have a wound, scrape, corn, or callus on your foot and: ? You have pain, swelling, or redness that gets worse. ? You have fluid or blood coming from the wound, scrape, corn, or callus. ? Your wound, scrape, corn, or callus feels warm to the touch. ? You have pus or a bad smell coming from the wound, scrape, corn, or callus. ? You have a fever. ? You have a red line going up your leg. Summary  Check your feet every day for blisters, cuts, bruises, sores, and redness.  Apply a moisturizing lotion or petroleum jelly to the skin on your feet and to dry, brittle toenails.  Wear shoes that fit properly and have enough cushioning.  If you have foot problems, report any cuts, sores, or bruises to your health care provider immediately.  Schedule a complete foot exam at least once a year (annually) or more often if you have foot problems. This information is not intended to replace advice given to you by your health care provider. Make sure you discuss any questions you have with your health care provider. Document Revised: 02/26/2020 Document Reviewed: 02/26/2020 Elsevier  Elsevier Patient Education  2021 Elsevier Inc.   

## 2020-11-02 NOTE — Assessment & Plan Note (Signed)
History of hyperlipidemia. Currently on rosuvastatin.  Refills provided today.  Will obtain labs to further evaluate effectiveness of treatment.

## 2020-11-02 NOTE — Assessment & Plan Note (Signed)
Blood pressure well controlled today at 104/80. Refills sent for lisinopril  Will monitor labs today Follow-up in 3 months.

## 2020-11-02 NOTE — Assessment & Plan Note (Signed)
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

## 2020-11-02 NOTE — Assessment & Plan Note (Signed)
History of goiter. Unsure of testing or studies performed- records requested for further review. Mild edema of the anterior neck at location of thyroid present today without any nodularity or tenderness noted on palpation.  Will obtain thyroid labs today for further review.

## 2020-11-02 NOTE — Assessment & Plan Note (Signed)
History of vitamin D deficiency. Patient previously on replacement therapy, but not currently taking.  Will obtain labs today for further evaluation and will make recommendations for changes to the plan of care as needed based on results.

## 2020-11-02 NOTE — Progress Notes (Signed)
Recommended physical activity with brisk walking 20 minutes per day at least 5 days of the week.

## 2020-11-02 NOTE — Assessment & Plan Note (Addendum)
Type 2 diabetes currently off of medication due to running out of refills and difficulty getting these sent in from previous provider.  Previous records requested today- she may need updated dilated eye exam.  Hemoglobin A1c is 9.5% today in the office showing need for increased control.  Will restart metformin 500mg  BID. Prescription sent to pharmacy.  Prescription for new blood glucometer sent to pharmacy with recommendations for at least once daily fasting monitoring and PRN monitoring for symptoms of hyper/hypoglycemia or with meals for greater understanding of food choices and blood sugar control.  Will obtain CMP and Lipids today, as well.  Urine micro in office today shows no signs of microalbuminuria. UA is positive for glucose, but this is expected with uncontrolled blood sugars.  Foot exam in office today shows no signs of neuropathy or ulcerations. Evidence of thickening toenails is present- will need close monitoring.  Recommend dietary monitoring and reduced intake of carbohydrates. Patient provided with education and recommendations in handout and discussed with her.  Plan to follow-up in 3 months for repeat A1c.  She may benefit from diabetes education if her blood glucose levels remain uncontrolled.

## 2020-11-02 NOTE — Assessment & Plan Note (Signed)
History of calcium deficiency currently on calcium replacement daily.  Refills provided on medication today.  Will obtain labs for further evaluation.  She reports she is up to date on her DEXA scan, will obtain records for review and make changes to plan of care as necessary.

## 2020-11-02 NOTE — Assessment & Plan Note (Signed)
Patient to establish care with practice.  Records release obtained today- will request previous records from PCP and speciality providers for evaluation of health history.  Physical exam and labs obtained today for baseline.

## 2020-11-02 NOTE — Assessment & Plan Note (Signed)
Long standing history of GERD well controlled with famotidine BID. It is unclear if the GI concerns she has are related to GERD symptoms that are intermittently aggravated with dietary choices are possible from reported hiatal hernia. No hernia felt on palpation today.  Will request previous notes to determine a better understanding of the hernia and discussions surrounding treatment.  Refills provided today.  If symptoms persist may consider changing to PPI for better control.  Patient to follow-up if symptoms persist.

## 2020-11-02 NOTE — Assessment & Plan Note (Signed)
Chronic allergic rhinitis. Currently on levocitirizine daily for management.  Patient reported moderate continued symptoms.  Refill provided on medication today.  Will obtain labs for further evaluation of affect on white blood counts.  Consider changing medication for better control if patient continues to have chronic symptoms. She may benefit from montelukast.  Will obtain previous notes to see what has been tried in the past.

## 2020-11-02 NOTE — Assessment & Plan Note (Signed)
Multiple areas of scattered actinic keratosis noted on the skin today. No changes to the surrounding skin, drainage, or erythema noted today.  Discussed presentation with patient and recommendation for removal with cryotherapy. Patient would prefer to wait for this at this time. Will continue to monitor and re-assess as needed.

## 2020-11-02 NOTE — Assessment & Plan Note (Signed)
Intermittent hot flashes well controlled on daily paroxetine. Patient has been on this medication for some time and reports effectiveness for post menopausal symptoms.  Refills provided today.

## 2020-11-02 NOTE — Progress Notes (Signed)
.   New Patient Office Visit  Subjective:  Patient ID: Kendra Miles, female    DOB: 1951/04/22  Age: 70 y.o. MRN: [id number]  CC:  Chief Complaint  Patient presents with  . Establish Care    HPI Kendra Miles is a pleasant 70 year old female presenting today to establish care with this practice.   She has a medical history positive for: DM2, HTN, HLD, GERD, Allergic Rhinitis, intestinal polyps, calcium deficiency, vitamin d deficiency, thyroid nodule and goiter, and back pain.  She has been seen by [facility] for primary care as well as specialty services of dermatology, podiatry, and cardiology.   She currently takes the following medications: ASA 72m per day Lisinopril 518mq AM Famotidine 2036mID Fish Oil 1200m44mAM Levocitirizine 5mg 33m Rosuvastatin 5mg q52mPatoxetine 20mg q62malcium 600mg qA19mmen's complete vitamin qAM Metformin 500mg BID62me has been out of her metformin for about a month and therefore has not been taking this as prescribed. She reports that she typically checks her blood glucose once a day, but her meter is not functioning properly and she has been unable to check her blood sugar for the past several weeks.   She reports that she had a cholecystectomy in 2020 and every now and then she will experience upper abdominal pain when she "eats the wrong thing". She tells me this is usually relieved with vomiting, but it usually takes about 12 hours for her to fully recover. She reports that she has been told she has a hiatal hernia, however, surgical intervention was not recommended. She is unsure if the vomiting is related to the hernia or not.   She endorses occasional hot flashes intermittently, but these and her mood are well controlled with daily Paxil.   She reports an EGD in 2020 with removal of non cancerous polyp from her intestines.   She is widowed and currently lives alone. She works full time at [employer]. 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 She reports enjoyment in her job.   She has 4 adult children. One son lives in the [place] area [place] the other three (2 sons and a daughter) live in [place]. [place] 9 grandchildren and 2 great grandchildren with another baby on the way. Her parents are still living and live in [place] them on a weekly basis. She visits her children about once a year in [place].   SIowaells me that her current diet is "not good". She reports that she eats out about twice a month and tries to pick healthy options, otherwise she eats her meals at home and tends to eat what she has on hand. She denies low carbohydrate, low fat, and low sodium options.  She tells me that she does not get much physical activity with the exception of walking at work. She endorses bilateral knee and hip pain chronically due to arthritis and mid back pain after working all day, which she feels also may be related to arthritis. She takes aleve intermittently to help with back, knee, and hip pain and she tells me this is effective for her.   She denies alcohol use She denies nicotine use She denies illegal substance/drug use  She endorses regular vision exams with no concerns today.  She reports a negative encounter with a dentist as a child and therefore tells me she does not obtain regular dental visits, but she is  seen if she feels there is a problem.   She is currently not sexually active. She denies concerns about STI today.   She is post-menopausal and post hysterectomy. She reports a partial hysterectomy with ovaries remaining only. She denies vaginal bleeding or spotting.   She denies changes to her bowel habits.  She denies changes to her bladder habits. She endorses recent skin changes with "rough moles" that have appeared in several areas over her skin. She denies pain, bleeding, drainage, irregular coloration or borders on the areas. She has had dermatology evaluation at her  previous PCP.   Past Medical History:  Diagnosis Date  . Acid reflux   . Cholelithiasis with choledocholithiasis 02/2019  . Hiatal hernia   . Polyp of stomach and duodenum     Past Surgical History:  Procedure Laterality Date  . ABDOMINAL HYSTERECTOMY     partial  . APPENDECTOMY    . BIOPSY  03/08/2019   Procedure: BIOPSY;  Surgeon: Gatha Mayer, MD;  Location: [facility];  Service: Endoscopy;;  . CHOLECYSTECTOMY N/A 03/09/2019   Procedure: laparoscopic cholecystectomy;  Surgeon: Jovita Kussmaul, MD;  Location: [facility];  Service: General;  Laterality: N/A;  . CYST REMOVAL TRUNK    . ERCP N/A 03/08/2019   Procedure: ENDOSCOPIC RETROGRADE CHOLANGIOPANCREATOGRAPHY (ERCP);  Surgeon: Gatha Mayer, MD;  Location: [facility];  Service: Endoscopy;  Laterality: N/A;  . ESOPHAGOGASTRODUODENOSCOPY (EGD) WITH PROPOFOL N/A 04/24/2019   Procedure: ESOPHAGOGASTRODUODENOSCOPY (EGD) WITH PROPOFOL;  Surgeon: Ronnette Juniper, MD;  Location: [facility];  Service: Gastroenterology;  Laterality: N/A;  . HEMOSTASIS CLIP PLACEMENT  04/24/2019   Procedure: HEMOSTASIS CLIP PLACEMENT;  Surgeon: Ronnette Juniper, MD;  Location: [facility];  Service: Gastroenterology;;  . PANCREATIC STENT PLACEMENT  03/08/2019   Procedure: PANCREATIC STENT PLACEMENT;  Surgeon: Gatha Mayer, MD;  Location: [facility];  Service: Endoscopy;;  . POLYPECTOMY  04/24/2019   Procedure: POLYPECTOMY;  Surgeon: Ronnette Juniper, MD;  Location: [facility];  Service: Gastroenterology;;  . REMOVAL OF STONES  03/08/2019   Procedure: REMOVAL OF STONES;  Surgeon: Gatha Mayer, MD;  Location: [facility];  Service: Endoscopy;;  . Joan Mayans  03/08/2019   Procedure: SPHINCTEROTOMY;  Surgeon: Gatha Mayer, MD;  Location: [facility];  Service: Endoscopy;;    Family History  Problem Relation Age of Onset  . COPD Father     Social History   Socioeconomic History  . Marital status: Widowed    Spouse name: Not on file  . Number of  children: 4  . Years of education: Not on file  . Highest education level: Not on file  Occupational History  . Occupation: Surveyor, quantity: [employer]    Comment: 27 years  Tobacco Use  . Smoking status: Never Smoker  . Smokeless tobacco: Never Used  Vaping Use  . Vaping Use: Never used  Substance and Sexual Activity  . Alcohol use: No  . Drug use: Never  . Sexual activity: Not Currently  Other Topics Concern  . Not on file  Social History Narrative   Lives alone.    4 adult children, 3 sons and 1 daughter. One son lives in the area, other children live in [place] with 9 grandchildren and 2 great grandchildren +1 on the way. She visits them every year.    Mother (37) and Father (66) both alive and live in [place].       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    Social Determinants of Health   Financial Resource Strain: Not on file  Food Insecurity: Not on file  Transportation Needs: No Transportation Needs  . Lack of Transportation (Medical): No  . Lack of Transportation (Non-Medical): No  Physical Activity: Insufficiently Active  . Days of Exercise per Week: 2 days  . Minutes of Exercise per Session: 10 min  Stress: Not on file  Social Connections: Not on file  Intimate Partner Violence: Not on file    ROS Review of Systems  Constitutional: Positive for fatigue. Negative for activity change, appetite change and unexpected weight change.  HENT: Positive for congestion, postnasal drip, rhinorrhea and sinus pain. Negative for ear pain, facial swelling, hearing loss, sore throat, tinnitus and trouble swallowing.   Eyes: Negative for visual disturbance.  Respiratory: Negative for cough, chest tightness and shortness of breath.   Cardiovascular: Negative for chest pain, palpitations and leg swelling.  Gastrointestinal: Negative for abdominal distention, abdominal pain, blood in stool, constipation,  diarrhea, nausea and vomiting.  Endocrine: Negative for cold intolerance, heat intolerance, polydipsia, polyphagia and polyuria.  Genitourinary: Negative for difficulty urinating, dyspareunia, dysuria, frequency, menstrual problem, vaginal bleeding, vaginal discharge and vaginal pain.  Musculoskeletal: Positive for arthralgias and back pain. Negative for gait problem, joint swelling and myalgias.  Skin: Positive for color change.  Allergic/Immunologic: Positive for environmental allergies. Negative for immunocompromised state.  Neurological: Negative for dizziness, tremors, syncope, facial asymmetry, speech difficulty, weakness, light-headedness, numbness and headaches.  Hematological: Negative for adenopathy. Does not bruise/bleed easily.  Psychiatric/Behavioral: Positive for sleep disturbance. Negative for agitation, behavioral problems, confusion, decreased concentration, dysphoric mood, self-injury and suicidal ideas. The patient is not nervous/anxious.     All ROS negative with exception of what is listed in HPI  Objective:   Today's Vitals: BP 104/80   Pulse 69   Temp 98.6 F (37 C) (Oral)   Resp 12   Ht _0  (1.676 m)   Wt 187 lb (84.8 kg)   SpO2 97%   BMI 30.18 kg/m   Physical Exam Vitals and nursing note reviewed.  Constitutional:      Appearance: Normal appearance. She is obese.  HENT:     Head: Normocephalic.     Right Ear: Tympanic membrane, ear canal and external ear normal.     Left Ear: Tympanic membrane, ear canal and external ear normal.     Nose: Congestion and rhinorrhea present.     Mouth/Throat:     Mouth: Mucous membranes are moist.     Pharynx: Oropharynx is clear. No oropharyngeal exudate or posterior oropharyngeal erythema.  Eyes:     Extraocular Movements: Extraocular movements intact.     Conjunctiva/sclera: Conjunctivae normal.     Pupils: Pupils are equal, round, and reactive to light.  Neck:     Vascular: No carotid bruit.  Cardiovascular:      Rate and Rhythm: Normal rate and regular rhythm.     Pulses: Normal pulses.     Heart sounds: Normal heart sounds. No murmur heard.   Pulmonary:     Effort: Pulmonary effort is normal. No respiratory distress.     Breath sounds: Normal breath sounds.  Abdominal:     General: Bowel sounds are normal. There is no distension.     Palpations: Abdomen is soft.     Tenderness: There is no abdominal tenderness. There is no right CVA tenderness, left CVA tenderness or guarding.  Musculoskeletal:  General: No tenderness. Normal range of motion.     Cervical back: Normal range of motion. No rigidity or tenderness.     Right lower leg: No edema.     Left lower leg: No edema.  Lymphadenopathy:     Cervical: No cervical adenopathy.  Skin:    General: Skin is warm and dry.     Capillary Refill: Capillary refill takes less than 2 seconds.  Neurological:     General: No focal deficit present.     Mental Status: She is alert and oriented to person, place, and time.     Cranial Nerves: No cranial nerve deficit.     Sensory: No sensory deficit.     Motor: No weakness.     Coordination: Coordination normal.     Gait: Gait normal.     Deep Tendon Reflexes: Reflexes normal.  Psychiatric:        Mood and Affect: Mood normal.        Behavior: Behavior normal.        Thought Content: Thought content normal.        Judgment: Judgment normal.     Assessment & Plan:   Problem List Items Addressed This Visit      Cardiovascular and Mediastinum   HTN (hypertension)    Blood pressure well controlled today at 104/80. Refills sent for lisinopril  Will monitor labs today Follow-up in 3 months.       Relevant Medications   lisinopril (ZESTRIL) 5 MG tablet   rosuvastatin (CRESTOR) 5 MG tablet   aspirin 81 MG chewable tablet   Other Relevant Orders   Comprehensive metabolic panel (Completed)   Lipid panel   POCT urinalysis dipstick (Completed)     Respiratory   Non-seasonal allergic  rhinitis due to pollen    Chronic allergic rhinitis. Currently on levocitirizine daily for management.  Patient reported moderate continued symptoms.  Refill provided on medication today.  Will obtain labs for further evaluation of affect on white blood counts.  Consider changing medication for better control if patient continues to have chronic symptoms. She may benefit from montelukast.  Will obtain previous notes to see what has been tried in the past.       Relevant Medications   loratadine (CLARITIN) 10 MG tablet     Digestive   GERD (gastroesophageal reflux disease)    Long standing history of GERD well controlled with famotidine BID. It is unclear if the GI concerns she has are related to GERD symptoms that are intermittently aggravated with dietary choices are possible from reported hiatal hernia. No hernia felt on palpation today.  Will request previous notes to determine a better understanding of the hernia and discussions surrounding treatment.  Refills provided today.  If symptoms persist may consider changing to PPI for better control.  Patient to follow-up if symptoms persist.       Relevant Medications   famotidine (PEPCID) 20 MG tablet     Endocrine   DM (diabetes mellitus) ([place])    Type 2 diabetes currently off of medication due to running out of refills and difficulty getting these sent in from previous provider.  Previous records requested today- she may need updated dilated eye exam.  Hemoglobin A1c is 9.5% today in the office showing need for increased control.  Will restart metformin $RemoveBeforeDEI'500mg'[id number]$  BID. Prescription sent to pharmacy.  Prescription for new blood glucometer sent to pharmacy with recommendations for at least once daily fasting monitoring and PRN monitoring for symptoms  of hyper/hypoglycemia or with meals for greater understanding of food choices and blood sugar control.  Will obtain CMP and Lipids today, as well.  Urine micro in office today shows no signs  of microalbuminuria. UA is positive for glucose, but this is expected with uncontrolled blood sugars.  Foot exam in office today shows no signs of neuropathy or ulcerations. Evidence of thickening toenails is present- will need close monitoring.  Recommend dietary monitoring and reduced intake of carbohydrates. Patient provided with education and recommendations in handout and discussed with her.  Plan to follow-up in 3 months for repeat A1c.  She may benefit from diabetes education if her blood glucose levels remain uncontrolled.       Relevant Medications   lisinopril (ZESTRIL) 5 MG tablet   metFORMIN (GLUCOPHAGE) 500 MG tablet   rosuvastatin (CRESTOR) 5 MG tablet   blood glucose meter kit and supplies   aspirin 81 MG chewable tablet   emollient lotion   Other Relevant Orders   Hemoglobin A1c   POCT urinalysis dipstick (Completed)   POCT HgB A1C (Completed)   POCT UA - Microalbumin (Completed)   Nontoxic multinodular goiter    History of goiter. Unsure of testing or studies performed- records requested for further review. Mild edema of the anterior neck at location of thyroid present today without any nodularity or tenderness noted on palpation.  Will obtain thyroid labs today for further review.       Thyroid nodule    See nontoxic multinodular goiter for full A&P      Relevant Orders   Thyroid Panel With TSH     Musculoskeletal and Integument   Actinic keratosis    Multiple areas of scattered actinic keratosis noted on the skin today. No changes to the surrounding skin, drainage, or erythema noted today.  Discussed presentation with patient and recommendation for removal with cryotherapy. Patient would prefer to wait for this at this time. Will continue to monitor and re-assess as needed.         Other   Hyperlipidemia    History of hyperlipidemia. Currently on rosuvastatin.  Refills provided today.  Will obtain labs to further evaluate effectiveness of treatment.        Relevant Medications   lisinopril (ZESTRIL) 5 MG tablet   rosuvastatin (CRESTOR) 5 MG tablet   aspirin 81 MG chewable tablet   Vitamin D deficiency    History of vitamin D deficiency. Patient previously on replacement therapy, but not currently taking.  Will obtain labs today for further evaluation and will make recommendations for changes to the plan of care as needed based on results.       Relevant Medications   calcium carbonate (OSCAL) 1500 (600 Ca) MG TABS tablet   Other Relevant Orders   Vitamin D (25 hydroxy)   Encounter to establish care with new doctor - Primary    Patient to establish care with practice.  Records release obtained today- will request previous records from PCP and speciality providers for evaluation of health history.  Physical exam and labs obtained today for baseline.       Relevant Orders   CBC with Differential/Platelet (Completed)   Comprehensive metabolic panel (Completed)   Hemoglobin A1c   Lipid panel   POCT urinalysis dipstick (Completed)   POCT HgB A1C (Completed)   POCT UA - Microalbumin (Completed)   Thyroid Panel With TSH   Calcium deficiency    History of calcium deficiency currently on calcium replacement daily.  Refills  provided on medication today.  Will obtain labs for further evaluation.  She reports she is up to date on her DEXA scan, will obtain records for review and make changes to plan of care as necessary.       Relevant Medications   calcium carbonate (OSCAL) 1500 (600 Ca) MG TABS tablet   Menopausal symptoms    Intermittent hot flashes well controlled on daily paroxetine. Patient has been on this medication for some time and reports effectiveness for post menopausal symptoms.  Refills provided today.       Relevant Medications   PARoxetine (PAXIL) 20 MG tablet    Other Visit Diagnoses    Body mass index (BMI) of 30.0-30.9 in adult       Elevated LFTs          Outpatient Encounter Medications as of 11/02/2020   Medication Sig  . blood glucose meter kit and supplies Testing at least once per day (morning fasting), with option to test up to 4 times per day (morning fasting and as needed for symptoms) as needed for better control. Use only the strips that come in this kit with this meter- other strips will not work properly.  Marland Kitchen emollient lotion Apply topically 2 (two) times daily as needed. Apply to legs as needed for itching and dryness.  Marland Kitchen aspirin 81 MG chewable tablet Chew 1 tablet (81 mg total) by mouth daily.  . calcium carbonate (OSCAL) 1500 (600 Ca) MG TABS tablet Take 0.4 tablets (600 mg total) by mouth daily.  . famotidine (PEPCID) 20 MG tablet Take 1 tablet (20 mg total) by mouth 2 (two) times daily.  Marland Kitchen HYDROcodone-acetaminophen (NORCO/VICODIN) 5-325 MG tablet Take 1 tablet by mouth every 6 (six) hours as needed for severe pain.  Marland Kitchen lisinopril (ZESTRIL) 5 MG tablet Take 1 tablet (5 mg total) by mouth daily.  Marland Kitchen loratadine (CLARITIN) 10 MG tablet Take 1 tablet (10 mg total) by mouth daily.  . metFORMIN (GLUCOPHAGE) 500 MG tablet Take 1 tablet (500 mg total) by mouth 2 (two) times daily with a meal.  . Multiple Vitamin (MULTIVITAMIN WITH MINERALS) TABS tablet Take 1 tablet by mouth daily. Women's 50+ Multivitamin  . Omega-3 Fatty Acids (FISH OIL) 1200 MG CAPS Take 1,200 mg by mouth daily.  Letta Pate VERIO test strip 3 (three) times daily.  Marland Kitchen PARoxetine (PAXIL) 20 MG tablet Take 1 tablet (20 mg total) by mouth daily.  . rosuvastatin (CRESTOR) 5 MG tablet Take 1 tablet (5 mg total) by mouth daily.  . [DISCONTINUED] aspirin 81 MG chewable tablet Chew 81 mg by mouth daily.  . [DISCONTINUED] calcium carbonate (OSCAL) 1500 (600 Ca) MG TABS tablet Take 600 mg by mouth daily.  . [DISCONTINUED] cholecalciferol (VITAMIN D3) 25 MCG (1000 UT) tablet Take 1,000 Units by mouth daily.  . [DISCONTINUED] famotidine (PEPCID) 20 MG tablet Take 20 mg by mouth 2 (two) times daily.  . [DISCONTINUED] glipiZIDE  (GLUCOTROL) 10 MG tablet Take 10 mg by mouth daily.  . [DISCONTINUED] lisinopril (ZESTRIL) 2.5 MG tablet Take 2.5 mg by mouth daily.  . [DISCONTINUED] lisinopril (ZESTRIL) 5 MG tablet Take 5 mg by mouth daily.  . [DISCONTINUED] loratadine (CLARITIN) 10 MG tablet Take 10 mg by mouth daily.  . [DISCONTINUED] metFORMIN (GLUCOPHAGE) 500 MG tablet Take 500 mg by mouth 2 (two) times daily.  . [DISCONTINUED] omeprazole (PRILOSEC) 20 MG capsule Take 20 mg by mouth daily.  . [DISCONTINUED] ondansetron (ZOFRAN) 4 MG tablet Take 1 tablet (4 mg  total) by mouth every 6 (six) hours as needed for nausea. (Patient not taking: Reported on 04/21/2019)  . [DISCONTINUED] PARoxetine (PAXIL) 20 MG tablet Take 20 mg by mouth daily.  . [DISCONTINUED] polyethylene glycol (MIRALAX / GLYCOLAX) 17 g packet Take 17 g by mouth daily as needed for mild constipation. (Patient not taking: Reported on 04/21/2019)  . [DISCONTINUED] rosuvastatin (CRESTOR) 10 MG tablet Take 10 mg by mouth daily.   . [DISCONTINUED] rosuvastatin (CRESTOR) 5 MG tablet Take 5 mg by mouth daily.   No facility-administered encounter medications on file as of 11/02/2020.    Follow-up: Return in about 3 months (around 02/02/2021) for DM/HLD/HTN.   New patient with detailed intake and overview of past medical history and current medical concerns along with chart review, records request and review, as well as medication review performed. Counseling on diabetes control and recommendations for health management provided.  Orma Render, NP

## 2020-11-03 ENCOUNTER — Other Ambulatory Visit (HOSPITAL_BASED_OUTPATIENT_CLINIC_OR_DEPARTMENT_OTHER): Payer: Self-pay | Admitting: Nurse Practitioner

## 2020-11-03 DIAGNOSIS — R141 Gas pain: Secondary | ICD-10-CM | POA: Insufficient documentation

## 2020-11-03 DIAGNOSIS — E1165 Type 2 diabetes mellitus with hyperglycemia: Secondary | ICD-10-CM

## 2020-11-03 DIAGNOSIS — E781 Pure hyperglyceridemia: Secondary | ICD-10-CM

## 2020-11-03 HISTORY — DX: Gas pain: R14.1

## 2020-11-03 LAB — THYROID PANEL WITH TSH
Free Thyroxine Index: 1.6 (ref 1.2–4.9)
T3 Uptake Ratio: 26 % (ref 24–39)
T4, Total: 6.1 ug/dL (ref 4.5–12.0)
TSH: 1.58 u[IU]/mL (ref 0.450–4.500)

## 2020-11-03 NOTE — Progress Notes (Signed)
No signs of proteinuria and kidney function looks good. Glucose levels in urine high, but expected since she has been off of her metformin for several weeks. Restarted Metformin yesterday. Will recheck urine and blood glucose at visit in 6 weeks.   A1c at 9.5- metformin restarted yesterday. Goal <7. Follow-up scheduled for 6 weeks. Will plan to recheck A1c in 3 months for accurate reading. Spot check of glucose and urine at next visit.   Vitamin D levels look good. No need to add additional Vitamin D at this time. Continue multivitamin daily.   Triglycerides elevated- most likely secondary to uncontrolled blood sugar. Metformin restarted. Will plan to recheck in 3 months.   Protein levels are a little low, but consistent with history. We will continue to monitor this. Consider adding additional protein to diet.   Blood counts look good. She has historically been higher with Hgb and RBC consistent with levels now.   No further changes. Will re-evaluate in 6 weeks with plan for labs in 3 months.

## 2020-11-03 NOTE — Progress Notes (Signed)
Thyroid function normal. No concerns with previous diagnosis of thyroid nodules or goiter at this time. Will plan to continue to monitor.

## 2020-11-04 ENCOUNTER — Other Ambulatory Visit (HOSPITAL_BASED_OUTPATIENT_CLINIC_OR_DEPARTMENT_OTHER): Payer: Self-pay

## 2020-11-05 ENCOUNTER — Other Ambulatory Visit (HOSPITAL_BASED_OUTPATIENT_CLINIC_OR_DEPARTMENT_OTHER): Payer: Self-pay

## 2020-11-05 ENCOUNTER — Telehealth (HOSPITAL_BASED_OUTPATIENT_CLINIC_OR_DEPARTMENT_OTHER): Payer: Self-pay

## 2020-11-05 NOTE — Telephone Encounter (Signed)
-----   Message from Orma Render, NP sent at 11/03/2020 11:41 AM EDT ----- No signs of proteinuria and kidney function looks good. Glucose levels in urine high, but expected since she has been off of her metformin for several weeks. Restarted Metformin yesterday. Will recheck urine and blood glucose at visit in 6 weeks.   A1c at 9.5- metformin restarted yesterday. Goal <7. Follow-up scheduled for 6 weeks. Will plan to recheck A1c in 3 months for accurate reading. Spot check of glucose and urine at next visit.   Vitamin D levels look good. No need to add additional Vitamin D at this time. Continue multivitamin daily.   Triglycerides elevated- most likely secondary to uncontrolled blood sugar. Metformin restarted. Will plan to recheck in 3 months.   Protein levels are a little low, but consistent with history. We will continue to monitor this. Consider adding additional protein to diet.   Blood counts look good. She has historically been higher with Hgb and RBC consistent with levels now.   No further changes. Will re-evaluate in 6 weeks with plan for labs in 3 months.

## 2020-11-05 NOTE — Telephone Encounter (Signed)
Left message stating that lab results had been released via MyChart for patient to view Instructed patient call back with any questions or concerns or if patient cannot access lab results

## 2020-11-11 NOTE — Telephone Encounter (Signed)
Attempted to call patient 3 times for lab results and have left several voicemails Pt has not returned calls or viewed results on MyChart. Will send a letter to patient

## 2020-11-22 ENCOUNTER — Ambulatory Visit (HOSPITAL_BASED_OUTPATIENT_CLINIC_OR_DEPARTMENT_OTHER)
Admission: RE | Admit: 2020-11-22 | Discharge: 2020-11-22 | Disposition: A | Discharge status: Home / Self Care | Payer: Medicare Other | Source: Ambulatory Visit | Attending: Nurse Practitioner | Admitting: Nurse Practitioner

## 2020-11-22 ENCOUNTER — Other Ambulatory Visit: Payer: Self-pay

## 2020-11-22 DIAGNOSIS — Z1231 Encounter for screening mammogram for malignant neoplasm of breast: Secondary | ICD-10-CM | POA: Insufficient documentation

## 2020-11-23 ENCOUNTER — Ambulatory Visit (HOSPITAL_BASED_OUTPATIENT_CLINIC_OR_DEPARTMENT_OTHER): Payer: Medicare Other | Admitting: Radiology

## 2020-11-24 ENCOUNTER — Encounter (HOSPITAL_BASED_OUTPATIENT_CLINIC_OR_DEPARTMENT_OTHER): Payer: Self-pay

## 2020-11-24 ENCOUNTER — Encounter (HOSPITAL_BASED_OUTPATIENT_CLINIC_OR_DEPARTMENT_OTHER): Payer: Self-pay | Admitting: Nurse Practitioner

## 2020-11-24 NOTE — Progress Notes (Signed)
Mammogram was negative for concerning signs or evidence of cancer. Plan to repeat in 1 year for normal screening mammogram.

## 2020-11-25 ENCOUNTER — Telehealth (HOSPITAL_BASED_OUTPATIENT_CLINIC_OR_DEPARTMENT_OTHER): Payer: Self-pay

## 2020-11-25 NOTE — Telephone Encounter (Signed)
Results released by [name], AGNP.  Called patient to inform her of negative mammogram results. Instructed patient to contact the office with any questions or concerns.

## 2020-11-25 NOTE — Telephone Encounter (Signed)
-----   Message from Orma Render, NP sent at 11/24/2020  2:05 PM EDT ----- Mammogram was negative for concerning signs or evidence of cancer. Plan to repeat in 1 year for normal screening mammogram.

## 2021-01-17 ENCOUNTER — Encounter (HOSPITAL_BASED_OUTPATIENT_CLINIC_OR_DEPARTMENT_OTHER): Payer: Self-pay | Admitting: Nurse Practitioner

## 2021-01-18 MED ORDER — ONDANSETRON HCL 4 MG PO TABS: 4.0000 mg | ORAL_TABLET | Freq: Three times a day (TID) | ORAL | 3 refills | Status: DC | PRN

## 2021-01-27 ENCOUNTER — Other Ambulatory Visit (HOSPITAL_BASED_OUTPATIENT_CLINIC_OR_DEPARTMENT_OTHER): Payer: Self-pay | Admitting: Nurse Practitioner

## 2021-01-27 DIAGNOSIS — K219 Gastro-esophageal reflux disease without esophagitis: Secondary | ICD-10-CM

## 2021-02-03 ENCOUNTER — Other Ambulatory Visit: Payer: Self-pay

## 2021-02-03 ENCOUNTER — Ambulatory Visit (INDEPENDENT_AMBULATORY_CARE_PROVIDER_SITE_OTHER): Payer: Medicare Other | Admitting: Nurse Practitioner

## 2021-02-03 ENCOUNTER — Encounter (HOSPITAL_BASED_OUTPATIENT_CLINIC_OR_DEPARTMENT_OTHER): Payer: Self-pay | Admitting: Nurse Practitioner

## 2021-02-03 VITALS — BP 136/78 | HR 69 | Ht 65.0 in | Wt 187.6 lb

## 2021-02-03 DIAGNOSIS — E782 Mixed hyperlipidemia: Secondary | ICD-10-CM

## 2021-02-03 DIAGNOSIS — N951 Menopausal and female climacteric states: Secondary | ICD-10-CM | POA: Diagnosis not present

## 2021-02-03 DIAGNOSIS — Z794 Long term (current) use of insulin: Secondary | ICD-10-CM | POA: Diagnosis not present

## 2021-02-03 DIAGNOSIS — K922 Gastrointestinal hemorrhage, unspecified: Secondary | ICD-10-CM

## 2021-02-03 DIAGNOSIS — I10 Essential (primary) hypertension: Secondary | ICD-10-CM

## 2021-02-03 DIAGNOSIS — J301 Allergic rhinitis due to pollen: Secondary | ICD-10-CM

## 2021-02-03 DIAGNOSIS — E1165 Type 2 diabetes mellitus with hyperglycemia: Secondary | ICD-10-CM | POA: Diagnosis not present

## 2021-02-03 HISTORY — DX: Gastrointestinal hemorrhage, unspecified: K92.2

## 2021-02-03 LAB — CBC WITH DIFFERENTIAL/PLATELET
Basophils Absolute: 0.1 10*3/uL (ref 0.0–0.2)
Basos: 1 %
EOS (ABSOLUTE): 0.3 10*3/uL (ref 0.0–0.4)
Eos: 4 %
Hematocrit: 45.6 % (ref 34.0–46.6)
Hemoglobin: 15.1 g/dL (ref 11.1–15.9)
Immature Grans (Abs): 0 10*3/uL (ref 0.0–0.1)
Immature Granulocytes: 0 %
Lymphocytes Absolute: 2.1 10*3/uL (ref 0.7–3.1)
Lymphs: 25 %
MCH: 30.6 pg (ref 26.6–33.0)
MCHC: 33.1 g/dL (ref 31.5–35.7)
MCV: 93 fL (ref 79–97)
Monocytes Absolute: 0.5 10*3/uL (ref 0.1–0.9)
Monocytes: 6 %
Neutrophils Absolute: 5.4 10*3/uL (ref 1.4–7.0)
Neutrophils: 64 %
Platelets: 275 10*3/uL (ref 150–450)
RBC: 4.93 x10E6/uL (ref 3.77–5.28)
RDW: 13.1 % (ref 11.7–15.4)
WBC: 8.4 10*3/uL (ref 3.4–10.8)

## 2021-02-03 LAB — COMPREHENSIVE METABOLIC PANEL
ALT: 14 IU/L (ref 0–32)
AST: 11 IU/L (ref 0–40)
Albumin/Globulin Ratio: 2.2 (ref 1.2–2.2)
Albumin: 4.1 g/dL (ref 3.8–4.8)
Alkaline Phosphatase: 94 IU/L (ref 44–121)
BUN/Creatinine Ratio: 14 (ref 12–28)
BUN: 11 mg/dL (ref 8–27)
Bilirubin Total: 0.5 mg/dL (ref 0.0–1.2)
CO2: 26 mmol/L (ref 20–29)
Calcium: 9 mg/dL (ref 8.7–10.3)
Chloride: 107 mmol/L — ABNORMAL HIGH (ref 96–106)
Creatinine, Ser: 0.78 mg/dL (ref 0.57–1.00)
Globulin, Total: 1.9 g/dL (ref 1.5–4.5)
Glucose: 176 mg/dL — ABNORMAL HIGH (ref 65–99)
Potassium: 4.3 mmol/L (ref 3.5–5.2)
Sodium: 146 mmol/L — ABNORMAL HIGH (ref 134–144)
Total Protein: 6 g/dL (ref 6.0–8.5)
eGFR: 83 mL/min/{1.73_m2} (ref >59)

## 2021-02-03 LAB — POCT GLYCOSYLATED HEMOGLOBIN (HGB A1C): HbA1c POC (<> result, manual entry): 9.4 % (ref 4.0–5.6)

## 2021-02-03 MED ORDER — LORATADINE 10 MG PO TABS: 10.0000 mg | ORAL_TABLET | Freq: Every day | ORAL | 3 refills | Status: DC

## 2021-02-03 MED ORDER — ROSUVASTATIN CALCIUM 5 MG PO TABS: 5.0000 mg | ORAL_TABLET | Freq: Every day | ORAL | 3 refills | Status: DC

## 2021-02-03 MED ORDER — SUCRALFATE 1 G PO TABS: 1.0000 g | ORAL_TABLET | Freq: Four times a day (QID) | ORAL | 0 refills | Status: DC

## 2021-02-03 MED ORDER — LISINOPRIL 5 MG PO TABS
5.0000 mg | ORAL_TABLET | Freq: Every day | ORAL | 3 refills | Status: DC
Start: 2021-02-03 — End: 2021-05-06

## 2021-02-03 MED ORDER — PAROXETINE HCL 30 MG PO TABS: 30.0000 mg | ORAL_TABLET | Freq: Every day | ORAL | 3 refills | Status: DC

## 2021-02-03 MED ORDER — METFORMIN HCL 500 MG PO TABS: 500.0000 mg | ORAL_TABLET | Freq: Two times a day (BID) | ORAL | 3 refills | Status: DC

## 2021-02-03 MED ORDER — PANTOPRAZOLE SODIUM 40 MG PO TBEC: 40.0000 mg | DELAYED_RELEASE_TABLET | Freq: Two times a day (BID) | ORAL | 3 refills | Status: DC

## 2021-02-03 NOTE — Assessment & Plan Note (Signed)
Off medication for a few weeks d/t running out- will plan to check lipids in 3 months at next visit Recommend diet monitoring and daily walking of at least 20 minutes.

## 2021-02-03 NOTE — Assessment & Plan Note (Signed)
BP slightly elevated at start of visit- repeat BP 136/78 No changes to plan of care today- will follow in 3 months

## 2021-02-03 NOTE — Assessment & Plan Note (Signed)
Increased hot flashes Will trial increase of paroxetine to 30mg  for better control

## 2021-02-03 NOTE — Assessment & Plan Note (Signed)
A1c in office today CMP and CBC done Metformin 500mg  BID Not checking BG- has misplaced her meter Will make changes to plan based on labs F/U in 3 months

## 2021-02-03 NOTE — Patient Instructions (Addendum)
PLAN: Hold Aspirin until seen by GI doctor for evaluation Continue to monitor your blood sugars in the morning before you eat. Continue to take your medications as prescribed. Continue to monitor your diet and avoid foods high in fat, sugar, salt, and carbohydrates You want no more than 185-200 grams of carbohydrates per day No more than 1/2 of your daily calories should be in carbohydrates The attached paper has a list of foods high in carbohydrates Bad fats are typically solid at room temperature, good fats are found in vegetable oil and olive oil Ideally, you want no more 1500mg  of sodium (salt) per day I have sent a referral to the GI doctor for you to be seen and evaluated.  They will call you to schedule an appointment If you have not heard from them by next week, please let me know We are going to change the Famotidine (Pepcid) to something called Pantoprazole (Protonix) which is a stronger acid reducer and can help if there is an ulcer.  I am adding a medication called Sucralfate (Carafate) to be taken 4 times a day to help with the vomiting blood. If there is an ulcer, this will help heal it. You will only take this for 30 days until the GI doctor wants you to take it longer.  We are increasing your Paxil dose to 30mg  per day. You can take 1.5 tabs of your old prescription and when you pick up the new one, it will just be one tab a day.  If you have any new symptoms or start throwing up blood again, let me know immediately. If this is large amounts of blood or you are dizzy or feel lightheaded,  I want you to go to the emergency room.

## 2021-02-03 NOTE — Progress Notes (Signed)
Established Patient Office Visit  Subjective:  Patient ID: Kendra Miles, female    DOB: February 21, 1951  Age: 70 y.o. MRN: [id number]  CC:  Chief Complaint  Patient presents with   Follow-up   Diabetes    HPI Kendra Miles presents for F/U for DM, HTN, HLD.  At her last visit she had been off of Diabetes medications for several weeks. A1c was at 9.5%. Glucose levels in urine were also elevated, but no signs of proteinuria or decline in kidney function.  Triglycerides were elevated.  Currently taking: Metformin 500mg  BID Lisinopril 5mg   Rosuvastatin 5mg - has been out for a couple of weeks ASA 81mg   Famotidine 20mg  - has been out for a couple of weeks  DIABETES Taking medications as prescribed: yes Glucose Monitoring: no- Unable to find glucometer  Accucheck frequency: Not Checking  Fasting glucose: Hypoglycemic episodes:no Polydipsia/polyuria: no Visual changes: no Chest pain: no Paresthesias: no Blood Pressure Monitoring: not checking Retinal Examination: Up to Date Foot Exam:  Done today Pneumovax:  Done in October 2021- [facility] on [address] Influenza: Up to Date Aspirin: yes Shingrix: Done- [facility] on [address]  Last eye exam in February: [facility],  [place]  Hematemesis Endorses nausea with vomiting blood intermittently for the past 3 years- worse over the past year when not taking famotidine Taking famotidine 20mg  daily for prevention- ran out a few weeks ago. Since running out of famotidine, endorses emesis with initial bright red coloring followed by deep red coloring on "a couple" of occassions She endorses "no warning" for the emesis She denies abdominal pain, chest pain, epigastric pain, dizziness, weakness, or palpitations. She denies black or tarry stools.  She takes 81mg  ASA daily and use of Ibuprofen 800mg  "about 2 times per week" for joint pain.  Has a history of EGD in 04/2019 when hospitalized for gallbladder issues. Reports a  polyp was removed.  Denies etoh or cigarette use.  Nothing makes it better, being without famotidine makes it worse.  Heat Intolerance She endorses over the past two weeks she feels "Hot all the time" even when in air conditioning She denies known flushing No new medications Staying well hydrated Has been taking paxil for vasomotor symptoms and feels that possibly the dose needs increased   Past Medical History:  Diagnosis Date   Acid reflux    Cholelithiasis with choledocholithiasis 02/2019   Diabetes ([place])    Hiatal hernia    Hyperlipidemia    Polyp of stomach and duodenum    Lab Results  Component Value Date   HGBA1C 9.5 (A) 11/02/2020   Lab Results  Component Value Date   CHOL 174 11/02/2020   HDL 37 (L) 11/02/2020   LDLCALC 99 11/02/2020   TRIG 188 (H) 11/02/2020   CHOLHDL 4.7 11/02/2020     ROS Review of Systems All review of systems negative except what is listed in the HPI    Objective:    Physical Exam Vitals and nursing note reviewed.  Constitutional:      General: She is not in acute distress.    Appearance: Normal appearance. She is obese. She is not ill-appearing or diaphoretic.  HENT:     Head: Normocephalic and atraumatic.  Eyes:     Extraocular Movements: Extraocular movements intact.     Conjunctiva/sclera: Conjunctivae normal.     Pupils: Pupils are equal, round, and reactive to light.  Neck:     Vascular: No carotid bruit.  Cardiovascular:  Rate and Rhythm: Normal rate and regular rhythm.     Pulses: Normal pulses.     Heart sounds: Normal heart sounds.  Pulmonary:     Effort: Pulmonary effort is normal.     Breath sounds: Normal breath sounds.  Abdominal:     General: Abdomen is flat. Bowel sounds are normal. There is no distension.     Palpations: Abdomen is soft.     Tenderness: There is no abdominal tenderness. There is no right CVA tenderness, left CVA tenderness, guarding or rebound.  Musculoskeletal:        General:  Normal range of motion.     Cervical back: Neck supple. No rigidity.     Right lower leg: No edema.     Left lower leg: No edema.  Lymphadenopathy:     Cervical: No cervical adenopathy.  Skin:    General: Skin is warm and dry.     Capillary Refill: Capillary refill takes less than 2 seconds.  Neurological:     General: No focal deficit present.     Mental Status: She is alert and oriented to person, place, and time.  Psychiatric:        Mood and Affect: Mood normal.        Behavior: Behavior normal.        Thought Content: Thought content normal.        Judgment: Judgment normal.    BP 136/78   Pulse 69   Ht 5\' 5"  (1.651 m)   Wt 187 lb 9.6 oz (85.1 kg)   SpO2 95%   BMI 31.22 kg/m  Wt Readings from Last 3 Encounters:  02/03/21 187 lb 9.6 oz (85.1 kg)  11/02/20 187 lb (84.8 kg)  04/24/19 205 lb (93 kg)   Lab Results  Component Value Date   TSH 1.580 11/02/2020   Lab Results  Component Value Date   WBC 7.2 11/02/2020   HGB 15.8 (H) 11/02/2020   HCT 45.4 11/02/2020   MCV 87.5 11/02/2020   PLT 299 11/02/2020   Lab Results  Component Value Date   NA 140 11/02/2020   K 4.1 11/02/2020   CO2 28 11/02/2020   GLUCOSE 222 (H) 11/02/2020   BUN 15 11/02/2020   CREATININE 0.73 11/02/2020   BILITOT 0.8 11/02/2020   ALKPHOS 89 11/02/2020   AST 15 11/02/2020   ALT 16 11/02/2020   PROT 6.0 (L) 11/02/2020   ALBUMIN 4.2 11/02/2020   CALCIUM 9.1 11/02/2020   ANIONGAP 9 11/02/2020   Lab Results  Component Value Date   CHOL 174 11/02/2020   Lab Results  Component Value Date   HDL 37 (L) 11/02/2020   Lab Results  Component Value Date   LDLCALC 99 11/02/2020   Lab Results  Component Value Date   TRIG 188 (H) 11/02/2020   Lab Results  Component Value Date   CHOLHDL 4.7 11/02/2020   Lab Results  Component Value Date   HGBA1C 9.5 (A) 11/02/2020      Assessment & Plan:   Problem List Items Addressed This Visit     Primary hypertension    BP slightly  elevated at start of visit- repeat BP 136/78 No changes to plan of care today- will follow in 3 months       Relevant Medications   lisinopril (ZESTRIL) 5 MG tablet   rosuvastatin (CRESTOR) 5 MG tablet   Other Relevant Orders   Comprehensive metabolic panel   Type 2 diabetes mellitus with hyperglycemia, with  long-term current use of insulin (HCC) - Primary    A1c in office today CMP and CBC done Metformin 500mg  BID Not checking BG- has misplaced her meter Will make changes to plan based on labs F/U in 3 months       Relevant Medications   lisinopril (ZESTRIL) 5 MG tablet   metFORMIN (GLUCOPHAGE) 500 MG tablet   rosuvastatin (CRESTOR) 5 MG tablet   Other Relevant Orders   POCT glycosylated hemoglobin (Hb A1C)   CBC with Differential/Platelet   Comprehensive metabolic panel   Mixed hyperlipidemia    Off medication for a few weeks d/t running out- will plan to check lipids in 3 months at next visit Recommend diet monitoring and daily walking of at least 20 minutes.         Relevant Medications   lisinopril (ZESTRIL) 5 MG tablet   rosuvastatin (CRESTOR) 5 MG tablet   Other Relevant Orders   POCT glycosylated hemoglobin (Hb A1C)   Comprehensive metabolic panel   Menopausal symptoms    Increased hot flashes Will trial increase of paroxetine to 30mg  for better control        Relevant Medications   PARoxetine (PAXIL) 30 MG tablet   Upper GI bleed    Sx and presentation consistent with suspected gastric ulcer BRB emesis with dark red emesis following. Chart review reveals duodenal polyp in past and hiatal hernia. Recommend stop daily ASA 81mg - hold NSAIDs (ibuprofen/aleve) Urgent referral to GI sent to previous provider Start pantoprazole 40mg  BID and carafate 1g QID Will likely need repeat EDG for further evaluation Instructions on when to seek emergency care provided Will monitor CBC today       Relevant Medications   pantoprazole (PROTONIX) 40 MG tablet    sucralfate (CARAFATE) 1 g tablet   Other Relevant Orders   Ambulatory referral to Gastroenterology   CBC with Differential/Platelet   Non-seasonal allergic rhinitis due to pollen   Relevant Medications   loratadine (CLARITIN) 10 MG tablet    Meds ordered this encounter  Medications   lisinopril (ZESTRIL) 5 MG tablet    Sig: Take 1 tablet (5 mg total) by mouth daily.    Dispense:  90 tablet    Refill:  3   loratadine (CLARITIN) 10 MG tablet    Sig: Take 1 tablet (10 mg total) by mouth daily.    Dispense:  100 tablet    Refill:  3   metFORMIN (GLUCOPHAGE) 500 MG tablet    Sig: Take 1 tablet (500 mg total) by mouth 2 (two) times daily with a meal.    Dispense:  180 tablet    Refill:  3   PARoxetine (PAXIL) 30 MG tablet    Sig: Take 1 tablet (30 mg total) by mouth daily.    Dispense:  90 tablet    Refill:  3   rosuvastatin (CRESTOR) 5 MG tablet    Sig: Take 1 tablet (5 mg total) by mouth daily.    Dispense:  90 tablet    Refill:  3   pantoprazole (PROTONIX) 40 MG tablet    Sig: Take 1 tablet (40 mg total) by mouth 2 (two) times daily. After the first 30 days, decrease to once a day or as directed by GI    Dispense:  60 tablet    Refill:  3   sucralfate (CARAFATE) 1 g tablet    Sig: Take 1 tablet (1 g total) by mouth 4 (four) times daily.    Dispense:  120 tablet    Refill:  0    Follow-up: Return in about 3 months (around 05/06/2021) for DM/HTN.    Orma Render, NP

## 2021-02-03 NOTE — Assessment & Plan Note (Signed)
Sx and presentation consistent with suspected gastric ulcer BRB emesis with dark red emesis following. Chart review reveals duodenal polyp in past and hiatal hernia. Recommend stop daily ASA 81mg - hold NSAIDs (ibuprofen/aleve) Urgent referral to GI sent to previous provider Start pantoprazole 40mg  BID and carafate 1g QID Will likely need repeat EDG for further evaluation Instructions on when to seek emergency care provided Will monitor CBC today

## 2021-02-04 ENCOUNTER — Encounter (HOSPITAL_BASED_OUTPATIENT_CLINIC_OR_DEPARTMENT_OTHER): Payer: Self-pay | Admitting: Nurse Practitioner

## 2021-02-04 ENCOUNTER — Other Ambulatory Visit (HOSPITAL_BASED_OUTPATIENT_CLINIC_OR_DEPARTMENT_OTHER): Payer: Self-pay | Admitting: Nurse Practitioner

## 2021-02-04 DIAGNOSIS — D132 Benign neoplasm of duodenum: Secondary | ICD-10-CM | POA: Diagnosis not present

## 2021-02-04 DIAGNOSIS — I499 Cardiac arrhythmia, unspecified: Secondary | ICD-10-CM | POA: Diagnosis not present

## 2021-02-04 DIAGNOSIS — R1013 Epigastric pain: Secondary | ICD-10-CM | POA: Diagnosis not present

## 2021-02-04 DIAGNOSIS — K920 Hematemesis: Secondary | ICD-10-CM | POA: Diagnosis not present

## 2021-02-04 MED ORDER — LORATADINE 10 MG PO TABS: 10.0000 mg | ORAL_TABLET | Freq: Every day | ORAL | 3 refills | Status: AC

## 2021-02-04 NOTE — Addendum Note (Signed)
Addended by: Florita Nitsch, Clarise Cruz E on: 02/04/2021 06:37 PM   Modules accepted: Orders

## 2021-02-07 NOTE — Progress Notes (Signed)
Sodium and chloride levels elevated. Recommend decreased sodium intake and increased water consumption to help reduce levels.   Please call patient to schedule lab redraw of CMP in 4 weeks.

## 2021-02-08 ENCOUNTER — Telehealth (HOSPITAL_BASED_OUTPATIENT_CLINIC_OR_DEPARTMENT_OTHER): Payer: Self-pay

## 2021-02-08 NOTE — Telephone Encounter (Signed)
-----   Message from Orma Render, NP sent at 02/07/2021  7:33 PM EDT ----- Sodium and chloride levels elevated. Recommend decreased sodium intake and increased water consumption to help reduce levels.   Please call patient to schedule lab redraw of CMP in 4 weeks.

## 2021-02-08 NOTE — Telephone Encounter (Signed)
Results released by [name], AGNP and reviewed by patient via [place] patient to contact the office with any questions or concerns.  Patient has an appointment for labs on 03/08/21 @ 8:20 am.

## 2021-03-04 ENCOUNTER — Other Ambulatory Visit (HOSPITAL_BASED_OUTPATIENT_CLINIC_OR_DEPARTMENT_OTHER): Payer: Self-pay | Admitting: Nurse Practitioner

## 2021-03-04 DIAGNOSIS — K922 Gastrointestinal hemorrhage, unspecified: Secondary | ICD-10-CM

## 2021-03-08 ENCOUNTER — Other Ambulatory Visit: Payer: Self-pay

## 2021-03-08 ENCOUNTER — Ambulatory Visit (HOSPITAL_BASED_OUTPATIENT_CLINIC_OR_DEPARTMENT_OTHER): Payer: Medicare Other | Admitting: Nurse Practitioner

## 2021-03-08 DIAGNOSIS — E781 Pure hyperglyceridemia: Secondary | ICD-10-CM

## 2021-03-08 DIAGNOSIS — E1165 Type 2 diabetes mellitus with hyperglycemia: Secondary | ICD-10-CM

## 2021-03-09 LAB — TRIGLYCERIDES: Triglycerides: 182 mg/dL — ABNORMAL HIGH (ref 0–149)

## 2021-03-18 ENCOUNTER — Telehealth (HOSPITAL_BASED_OUTPATIENT_CLINIC_OR_DEPARTMENT_OTHER): Payer: Self-pay

## 2021-03-18 DIAGNOSIS — Z794 Long term (current) use of insulin: Secondary | ICD-10-CM

## 2021-03-18 DIAGNOSIS — K3189 Other diseases of stomach and duodenum: Secondary | ICD-10-CM | POA: Diagnosis not present

## 2021-03-18 DIAGNOSIS — E782 Mixed hyperlipidemia: Secondary | ICD-10-CM

## 2021-03-18 DIAGNOSIS — K449 Diaphragmatic hernia without obstruction or gangrene: Secondary | ICD-10-CM | POA: Diagnosis not present

## 2021-03-18 DIAGNOSIS — E1165 Type 2 diabetes mellitus with hyperglycemia: Secondary | ICD-10-CM

## 2021-03-18 DIAGNOSIS — K2930 Chronic superficial gastritis without bleeding: Secondary | ICD-10-CM | POA: Diagnosis not present

## 2021-03-18 DIAGNOSIS — K319 Disease of stomach and duodenum, unspecified: Secondary | ICD-10-CM | POA: Diagnosis not present

## 2021-03-18 DIAGNOSIS — K920 Hematemesis: Secondary | ICD-10-CM | POA: Diagnosis not present

## 2021-03-18 MED ORDER — ROSUVASTATIN CALCIUM 10 MG PO TABS
10.0000 mg | ORAL_TABLET | Freq: Every day | ORAL | 3 refills | Status: DC
Start: 2021-03-18 — End: 2021-11-03

## 2021-03-18 NOTE — Telephone Encounter (Signed)
-----   Message from Orma Render, NP sent at 03/18/2021  7:50 AM EDT ----- Pls call pt:  Triglycerides remain elevated. Recommend increasing rosuvastatin from '5mg'$  to '10mg'$  daily to help control this.   OK to sent prescription for rosuvastatin '10mg'$  qHS with refills x 6yr  Please schedule nurse visit for labs in 3 months to recheck lipid panel and CMP.

## 2021-03-18 NOTE — Progress Notes (Signed)
Pls call pt:  Triglycerides remain elevated. Recommend increasing rosuvastatin from '5mg'$  to '10mg'$  daily to help control this.   OK to sent prescription for rosuvastatin '10mg'$  qHS with refills x 41yr  Please schedule nurse visit for labs in 3 months to recheck lipid panel and CMP.

## 2021-03-18 NOTE — Telephone Encounter (Signed)
Patient is aware and agreeable to lab results and recommendation Confirmed preferred pharmacy for patient and will send Crest[facility] in

## 2021-03-22 DIAGNOSIS — K319 Disease of stomach and duodenum, unspecified: Secondary | ICD-10-CM | POA: Diagnosis not present

## 2021-04-21 ENCOUNTER — Encounter (HOSPITAL_BASED_OUTPATIENT_CLINIC_OR_DEPARTMENT_OTHER): Payer: Self-pay | Admitting: Nurse Practitioner

## 2021-04-28 ENCOUNTER — Other Ambulatory Visit (HOSPITAL_BASED_OUTPATIENT_CLINIC_OR_DEPARTMENT_OTHER): Payer: Self-pay | Admitting: Nurse Practitioner

## 2021-04-28 DIAGNOSIS — K922 Gastrointestinal hemorrhage, unspecified: Secondary | ICD-10-CM

## 2021-04-29 DIAGNOSIS — K449 Diaphragmatic hernia without obstruction or gangrene: Secondary | ICD-10-CM | POA: Diagnosis not present

## 2021-05-06 ENCOUNTER — Ambulatory Visit (HOSPITAL_BASED_OUTPATIENT_CLINIC_OR_DEPARTMENT_OTHER)
Admission: RE | Admit: 2021-05-06 | Discharge: 2021-05-06 | Disposition: A | Discharge status: Home / Self Care | Payer: Medicare Other | Source: Ambulatory Visit | Attending: Nurse Practitioner | Admitting: Nurse Practitioner

## 2021-05-06 ENCOUNTER — Encounter (HOSPITAL_BASED_OUTPATIENT_CLINIC_OR_DEPARTMENT_OTHER): Payer: Self-pay | Admitting: Nurse Practitioner

## 2021-05-06 ENCOUNTER — Ambulatory Visit (INDEPENDENT_AMBULATORY_CARE_PROVIDER_SITE_OTHER): Payer: Medicare Other | Admitting: Nurse Practitioner

## 2021-05-06 ENCOUNTER — Other Ambulatory Visit: Payer: Self-pay

## 2021-05-06 VITALS — BP 177/78 | HR 72 | Ht 66.0 in | Wt 185.0 lb

## 2021-05-06 DIAGNOSIS — M25552 Pain in left hip: Secondary | ICD-10-CM

## 2021-05-06 DIAGNOSIS — E1165 Type 2 diabetes mellitus with hyperglycemia: Secondary | ICD-10-CM

## 2021-05-06 DIAGNOSIS — E782 Mixed hyperlipidemia: Secondary | ICD-10-CM

## 2021-05-06 DIAGNOSIS — M160 Bilateral primary osteoarthritis of hip: Secondary | ICD-10-CM | POA: Insufficient documentation

## 2021-05-06 DIAGNOSIS — W19XXXA Unspecified fall, initial encounter: Secondary | ICD-10-CM | POA: Insufficient documentation

## 2021-05-06 DIAGNOSIS — M8588 Other specified disorders of bone density and structure, other site: Secondary | ICD-10-CM | POA: Insufficient documentation

## 2021-05-06 DIAGNOSIS — I10 Essential (primary) hypertension: Secondary | ICD-10-CM

## 2021-05-06 DIAGNOSIS — S79912A Unspecified injury of left hip, initial encounter: Secondary | ICD-10-CM | POA: Diagnosis present

## 2021-05-06 DIAGNOSIS — Z794 Long term (current) use of insulin: Secondary | ICD-10-CM | POA: Diagnosis not present

## 2021-05-06 HISTORY — DX: Unspecified fall, initial encounter: W19.XXXA

## 2021-05-06 LAB — POCT GLYCOSYLATED HEMOGLOBIN (HGB A1C): Hemoglobin A1C: 9.9 % — AB (ref 4.0–5.6)

## 2021-05-06 MED ORDER — GLIPIZIDE 5 MG PO TABS: 5.0000 mg | ORAL_TABLET | Freq: Two times a day (BID) | ORAL | 1 refills | Status: DC

## 2021-05-06 MED ORDER — MELOXICAM 7.5 MG PO TABS: 7.5000 mg | ORAL_TABLET | Freq: Every day | ORAL | 1 refills | Status: DC

## 2021-05-06 MED ORDER — ONDANSETRON HCL 4 MG PO TABS: 4.0000 mg | ORAL_TABLET | Freq: Three times a day (TID) | ORAL | 3 refills | Status: DC | PRN

## 2021-05-06 MED ORDER — METFORMIN HCL 500 MG PO TABS: 500.0000 mg | ORAL_TABLET | Freq: Two times a day (BID) | ORAL | 1 refills | Status: DC

## 2021-05-06 MED ORDER — LISINOPRIL 10 MG PO TABS: 10.0000 mg | ORAL_TABLET | Freq: Every day | ORAL | 1 refills | Status: DC

## 2021-05-06 NOTE — Assessment & Plan Note (Signed)
Fall on hard surface with residual pain in hip and groin.  No obvious abnormalities or deformities today Given post menopausal status and stature- will send for x-ray to ensure there are no fractures present Recommend ice and heat 20 minutes a day and meloxicam for pain Will made determination of next steps once imaging results have been received.

## 2021-05-06 NOTE — Progress Notes (Signed)
Established Patient Office Visit  Subjective:  Patient ID: Kendra Miles, female    DOB: 1951/08/06  Age: 70 y.o. MRN: [id number]  CC:  Chief Complaint  Patient presents with   Follow-up   Diabetes   Hypertension    HPI Kendra Miles presents for f/u for HTN, DM, and HLD  DIABETES Metformin 537m BID Medication Side Effects: none Taking medications as prescribed:  Yes  Glucose Monitoring:  occassionally   Average Fasting BG: 200's Blood Pressure Monitoring:  not checking  Hypoglycemic episodes:no Polydipsia/polyuria: no Visual changes: no Chest pain: no Paresthesias: yes Diabetic Diet: no Exercise: no  Retinal Examination: Up to Date Foot Exam: Due Today Urine Microalbumin: Up to Date Pneumovax: Up to Date Influenza: Due Now COVID:Up to Date  Last A1c: Current A1c:  Lab Results  Component Value Date   HGBA1C 9.9 (A) 05/06/2021   Next A1c due:  HYPERTENSION / HYPERLIPIDEMIA Current Medications and Doses: Satisfied with current treatment? yes Duration of hypertension: chronic Medication compliance: good compliance BP monitoring frequency: not checking BP range: Not checking BP medication side effects: no Cholesterol medication side effects: no Cholesterol supplements: none Duration of hyperlipidemia: chronic Aspirin: yes Recent stressors: yes Recurrent headaches: no Visual changes: no Palpitations: no Dyspnea: no Chest pain: no Lower extremity edema: no Dizzy/lightheaded: no  FALL Recent fall landing on left hip about 3 weeks ago Was not seen for fall Reports pain in her left hip and groin No decreased ROM, ecchymosis, edema,  or disfigurement of joint Endorses pain when laying on affected side or sitting with weight on that hip No pain with walking, bending, or lifting  Outpatient Medications Prior to Visit  Medication Sig Dispense Refill   aspirin 81 MG chewable tablet Chew 1 tablet (81 mg total) by mouth daily. 90 tablet 1   blood  glucose meter kit and supplies Testing at least once per day (morning fasting), with option to test up to 4 times per day (morning fasting and as needed for symptoms) as needed for better control. Use only the strips that come in this kit with this meter- other strips will not work properly. 1 each 99   calcium carbonate (OSCAL) 1500 (600 Ca) MG TABS tablet Take 0.4 tablets (600 mg total) by mouth daily. 90 tablet 1   emollient lotion Apply topically 2 (two) times daily as needed. Apply to legs as needed for itching and dryness. 240 mL 1   loratadine (CLARITIN) 10 MG tablet Take 1 tablet (10 mg total) by mouth daily. 90 tablet 3   Multiple Vitamin (MULTIVITAMIN WITH MINERALS) TABS tablet Take 1 tablet by mouth daily. Women's 50+ Multivitamin     Omega-3 Fatty Acids (FISH OIL) 1200 MG CAPS Take 1,200 mg by mouth daily.     pantoprazole (PROTONIX) 40 MG tablet TAKE 1 TABLET BY MOUTH TWICE A DAY. AFTER FIRST 30 DAYS,DECREASE TO ONCE DAILY OR AS DIRECTED BY GI 180 tablet 1   PARoxetine (PAXIL) 30 MG tablet Take 1 tablet (30 mg total) by mouth daily. 90 tablet 3   rosuvastatin (CRESTOR) 10 MG tablet Take 1 tablet (10 mg total) by mouth daily. 90 tablet 3   sucralfate (CARAFATE) 1 g tablet TAKE 1 TABLET BY MOUTH 4 TIMES DAILY. 120 tablet 0   lisinopril (ZESTRIL) 5 MG tablet Take 1 tablet (5 mg total) by mouth daily. 90 tablet 3   metFORMIN (GLUCOPHAGE) 500 MG tablet Take 1 tablet (500 mg total) by mouth 2 (two) times daily with  a meal. 180 tablet 3   ondansetron (ZOFRAN) 4 MG tablet Take 1 tablet (4 mg total) by mouth every 8 (eight) hours as needed for nausea or vomiting. 30 tablet 3   No facility-administered medications prior to visit.    Allergies  Allergen Reactions   Ciprofloxacin Other (See Comments)    Stomach cramps Stomach cramps   Guaifenesin Er Other (See Comments)   Penicillin G Other (See Comments)   Penicillins Palpitations    Did it involve swelling of the face/tongue/throat, SOB,  or low BP? No Did it involve sudden or severe rash/hives, skin peeling, or any reaction on the inside of your mouth or nose? No Did you need to seek medical attention at a hospital or doctor's office? No When did it last happen? [phone]     If all above answers are "NO", may proceed with cephalosporin use.     ROS Review of Systems All review of systems negative except what is listed in the HPI    Objective:    Physical Exam Vitals and nursing note reviewed.  Constitutional:      Appearance: Normal appearance. She is obese.  HENT:     Head: Normocephalic.  Eyes:     Extraocular Movements: Extraocular movements intact.     Conjunctiva/sclera: Conjunctivae normal.     Pupils: Pupils are equal, round, and reactive to light.  Neck:     Vascular: No carotid bruit.  Cardiovascular:     Rate and Rhythm: Normal rate and regular rhythm.     Pulses: Normal pulses.     Heart sounds: Normal heart sounds.  Pulmonary:     Effort: Pulmonary effort is normal.     Breath sounds: Normal breath sounds.  Abdominal:     General: Abdomen is flat. Bowel sounds are normal.     Palpations: Abdomen is soft.  Musculoskeletal:        General: Tenderness and signs of injury present.     Cervical back: Normal range of motion.     Right hip: Normal.     Left hip: Tenderness and bony tenderness present. No deformity. Normal range of motion. Normal strength.     Right lower leg: No edema.     Left lower leg: No edema.       Legs:  Skin:    General: Skin is warm and dry.     Capillary Refill: Capillary refill takes less than 2 seconds.  Neurological:     General: No focal deficit present.     Mental Status: She is alert and oriented to person, place, and time.  Psychiatric:        Mood and Affect: Mood normal.        Behavior: Behavior normal.        Thought Content: Thought content normal.        Judgment: Judgment normal.    BP (!) 177/78   Pulse 72   Ht '5\' 6"'  (1.676 m)   Wt 185 lb  (83.9 kg)   SpO2 95%   BMI 29.86 kg/m  Wt Readings from Last 3 Encounters:  05/06/21 185 lb (83.9 kg)  02/03/21 187 lb 9.6 oz (85.1 kg)  11/02/20 187 lb (84.8 kg)     Health Maintenance Due  Topic Date Due   OPHTHALMOLOGY EXAM  Never done   Zoster Vaccines- Shingrix (1 of 2) Never done   COLONOSCOPY (Pts 45-62yr Insurance coverage will need to be confirmed)  Never done   DEXA  SCAN  Never done   COVID-19 Vaccine (3 - Pfizer risk series) 01/12/2020    There are no preventive care reminders to display for this patient.  Lab Results  Component Value Date   TSH 1.580 11/02/2020   Lab Results  Component Value Date   WBC 8.4 02/03/2021   HGB 15.1 02/03/2021   HCT 45.6 02/03/2021   MCV 93 02/03/2021   PLT 275 02/03/2021   Lab Results  Component Value Date   NA 146 (H) 02/03/2021   K 4.3 02/03/2021   CO2 26 02/03/2021   GLUCOSE 176 (H) 02/03/2021   BUN 11 02/03/2021   CREATININE 0.78 02/03/2021   BILITOT 0.5 02/03/2021   ALKPHOS 94 02/03/2021   AST 11 02/03/2021   ALT 14 02/03/2021   PROT 6.0 02/03/2021   ALBUMIN 4.1 02/03/2021   CALCIUM 9.0 02/03/2021   ANIONGAP 9 11/02/2020   EGFR 83 02/03/2021   Lab Results  Component Value Date   CHOL 174 11/02/2020   Lab Results  Component Value Date   HDL 37 (L) 11/02/2020   Lab Results  Component Value Date   LDLCALC 99 11/02/2020   Lab Results  Component Value Date   TRIG 182 (H) 03/08/2021   Lab Results  Component Value Date   CHOLHDL 4.7 11/02/2020   Lab Results  Component Value Date   HGBA1C 9.9 (A) 05/06/2021      Assessment & Plan:   Problem List Items Addressed This Visit     Primary hypertension    BP very elevated today Increase lisinopril to 47m per day and monitor at home Increase physical activity and monitor diet to avoid saturated fats, sodium, and carbohydrates Will recheck with labs in 3 months      Relevant Medications   lisinopril (ZESTRIL) 10 MG tablet   Other Relevant  Orders   Comprehensive metabolic panel   Type 2 diabetes mellitus with hyperglycemia, with long-term current use of insulin ([facility] - Primary    Diabetes not well controlled. A1 9.9% in office today, increased from last visit Reports drinking sweet tea, eating biscuits, and eating ice cream Recommend diabetic nutrition counseling, but patient declines today Recommend diet less than 1200 calories per day with no more than 150g of carbs throughout the day Avoid foods with sugar, breads, pasta, and potatoes. Given increase in sugars- will increase metformin to 10024mBID and add glipizide 56m74mID Monitor BG every AM and record readings F/U in 3 months for recheck  If BG drops below 70- notify office immediately.       Relevant Medications   ondansetron (ZOFRAN) 4 MG tablet   metFORMIN (GLUCOPHAGE) 500 MG tablet   glipiZIDE (GLUCOTROL) 5 MG tablet   lisinopril (ZESTRIL) 10 MG tablet   Other Relevant Orders   [facility] DIABETES FOOT EXAM (Completed)   POCT glycosylated hemoglobin (Hb A1C) (Completed)   Mixed hyperlipidemia    Stable- continue meds Will plan to recheck lipids in 3 months      Relevant Medications   lisinopril (ZESTRIL) 10 MG tablet   Fall    Fall on hard surface with residual pain in hip and groin.  No obvious abnormalities or deformities today Given post menopausal status and stature- will send for x-ray to ensure there are no fractures present Recommend ice and heat 20 minutes a day and meloxicam for pain Will made determination of next steps once imaging results have been received.       Relevant Medications  meloxicam (MOBIC) 7.5 MG tablet    Meds ordered this encounter  Medications   ondansetron (ZOFRAN) 4 MG tablet    Sig: Take 1 tablet (4 mg total) by mouth every 8 (eight) hours as needed for nausea or vomiting.    Dispense:  30 tablet    Refill:  3   metFORMIN (GLUCOPHAGE) 500 MG tablet    Sig: Take 1 tablet (500 mg total) by mouth 2 (two) times daily with a  meal.    Dispense:  180 tablet    Refill:  1   glipiZIDE (GLUCOTROL) 5 MG tablet    Sig: Take 1 tablet (5 mg total) by mouth 2 (two) times daily before a meal.    Dispense:  180 tablet    Refill:  1   lisinopril (ZESTRIL) 10 MG tablet    Sig: Take 1 tablet (10 mg total) by mouth daily.    Dispense:  90 tablet    Refill:  1   meloxicam (MOBIC) 7.5 MG tablet    Sig: Take 1 tablet (7.5 mg total) by mouth daily.    Dispense:  30 tablet    Refill:  1    Follow-up: Return in about 3 months (around 08/05/2021) for DM, HTN.    Orma Render, NP

## 2021-05-06 NOTE — Assessment & Plan Note (Signed)
Stable- continue meds Will plan to recheck lipids in 3 months

## 2021-05-06 NOTE — Patient Instructions (Signed)
Recommendations from today: - Continue metformin '500mg'$  twice a day - Start glipizide '5mg'$  twice a day with meals - Increase lisinopril to '10mg'$  (take 2 of the '5mg'$  tablets until you finish the current prescription- your new prescription will be the '10mg'$  dose and you will only need to take 1)  Diabetes and High Blood Pressure I recommend that you check your blood sugar at least once every morning before eating and write down this number to bring with you to your follow-up visits so we can go over them.  If your blood sugar is higher than 150 fasting for three readings, please let me know so we can adjust your medications.  It is always a good idea to also check your blood pressure in the mornings, as well, as diabetes and high blood pressure can go hand in hand.  Your goal blood pressure is less than 140/90.  If your blood pressure is higher than your goal for three readings in a row, please let me know so we can adjust your medications.  I recommend daily physical activity of at least 20 minutes to help improve your heart health and help your body use the excess sugar. I recommend no more than 1200-1500 calories per day with no more than 150 grams of carbohydrates. Avoid sweetened drinks and sugary foods.  High Cholesterol I recommend you limit your intake of foods with high cholesterol.  Fats that are solid at room temperature have more cholesterol than fats and oils that are liquid at room temperature.  Foods such as fatty meat, fried foods, and baked goods tend to be higher in cholesterol.

## 2021-05-06 NOTE — Assessment & Plan Note (Signed)
Diabetes not well controlled. A1 9.9% in office today, increased from last visit Reports drinking sweet tea, eating biscuits, and eating ice cream Recommend diabetic nutrition counseling, but patient declines today Recommend diet less than 1200 calories per day with no more than 150g of carbs throughout the day Avoid foods with sugar, breads, pasta, and potatoes. Given increase in sugars- will increase metformin to '1000mg'$  BID and add glipizide '5mg'$  BID Monitor BG every AM and record readings F/U in 3 months for recheck  If BG drops below 70- notify office immediately.

## 2021-05-06 NOTE — Assessment & Plan Note (Signed)
BP very elevated today Increase lisinopril to '10mg'$  per day and monitor at home Increase physical activity and monitor diet to avoid saturated fats, sodium, and carbohydrates Will recheck with labs in 3 months

## 2021-05-07 LAB — COMPREHENSIVE METABOLIC PANEL
ALT: 14 IU/L (ref 0–32)
AST: 14 IU/L (ref 0–40)
Albumin/Globulin Ratio: 2.2 (ref 1.2–2.2)
Albumin: 4.1 g/dL (ref 3.8–4.8)
Alkaline Phosphatase: 112 IU/L (ref 44–121)
BUN/Creatinine Ratio: 14 (ref 12–28)
BUN: 11 mg/dL (ref 8–27)
Bilirubin Total: 0.7 mg/dL (ref 0.0–1.2)
CO2: 26 mmol/L (ref 20–29)
Calcium: 9.5 mg/dL (ref 8.7–10.3)
Chloride: 101 mmol/L (ref 96–106)
Creatinine, Ser: 0.81 mg/dL (ref 0.57–1.00)
Globulin, Total: 1.9 g/dL (ref 1.5–4.5)
Glucose: 183 mg/dL — ABNORMAL HIGH (ref 65–99)
Potassium: 4.2 mmol/L (ref 3.5–5.2)
Sodium: 141 mmol/L (ref 134–144)
Total Protein: 6 g/dL (ref 6.0–8.5)
eGFR: 79 mL/min/{1.73_m2} (ref >59)

## 2021-05-09 NOTE — Progress Notes (Signed)
Langley Gauss,   Your hip x-ray doesn't show any damage to the bone or the soft tissues! This is great news- it's likely just very angry about the fall. I would continue to use ice for 20 minutes at a time and try gentle stretching to keep the muscles moving. There is evidence of arthritis and thinning of the bones, but this is not an acute finding. Some of the pain could be arthritis related and the fall just irritated it.   Let me know if this doesn't get any better! SaraBeth

## 2021-05-09 NOTE — Progress Notes (Signed)
Denies,   Your kidney and liver function look good. Your blood sugar was elevated, but that is expected. No changes to plan of care at this time. Hopefully we can get this blood sugar under better control for you quickly!  SaraBeth

## 2021-05-11 DIAGNOSIS — R112 Nausea with vomiting, unspecified: Secondary | ICD-10-CM | POA: Diagnosis not present

## 2021-05-11 DIAGNOSIS — K449 Diaphragmatic hernia without obstruction or gangrene: Secondary | ICD-10-CM | POA: Diagnosis not present

## 2021-05-13 ENCOUNTER — Telehealth (HOSPITAL_BASED_OUTPATIENT_CLINIC_OR_DEPARTMENT_OTHER): Payer: Self-pay

## 2021-05-13 NOTE — Telephone Encounter (Signed)
Results reviewed by patient via [place].  Seen on 05/10/2021  4:00 PM Instructed patient to contact the office with any questions or concerns.

## 2021-05-13 NOTE — Telephone Encounter (Signed)
-----   Message from Orma Render, NP sent at 05/09/2021  9:34 AM EDT ----- Denies,   Your kidney and liver function look good. Your blood sugar was elevated, but that is expected. No changes to plan of care at this time. Hopefully we can get this blood sugar under better control for you quickly!  Kendra Miles

## 2021-05-13 NOTE — Telephone Encounter (Signed)
-----   Message from Orma Render, NP sent at 05/09/2021  9:34 AM EDT ----- Denies,   Your kidney and liver function look good. Your blood sugar was elevated, but that is expected. No changes to plan of care at this time. Hopefully we can get this blood sugar under better control for you quickly!  SaraBeth

## 2021-05-13 NOTE — Telephone Encounter (Signed)
Results reviewed by patient via [place].  Seen on 05/10/2021  4:00 PM Instructed patient to contact the office with any questions or concerns.

## 2021-05-25 ENCOUNTER — Ambulatory Visit: Payer: Self-pay | Admitting: Surgery

## 2021-05-25 NOTE — Patient Instructions (Addendum)
DUE TO COVID-19 ONLY ONE VISITOR IS ALLOWED TO COME WITH YOU AND STAY IN THE WAITING ROOM ONLY DURING PRE OP AND PROCEDURE.   **NO VISITORS ARE ALLOWED IN THE SHORT STAY AREA OR RECOVERY ROOM!!**  IF YOU WILL BE ADMITTED INTO THE HOSPITAL YOU ARE ALLOWED ONLY TWO SUPPORT PEOPLE DURING VISITATION HOURS ONLY (7 AM -8PM)    Up to two visitors ages 55+ are allowed at one in a patient's room.  The visitors may rotate out with other people throughout the day.  Additionally, up to two children between the ages of 7 and 22 are allowed and do not count toward the number of allowed visitors.  Children within this age range must be accompanied by an adult visitor.  One adult visitor may remain with the patient overnight and must be in the room by 8 PM.  COVID SWAB TESTING MUST BE COMPLETED ON:  06-15-21, Between the hours of 8 and 3  **MUST PRESENT COMPLETED FORM AT TESTING SITE**    [facility] (backside of the building)  You are not required to quarantine, however you are required to wear a well-fitted mask when you are out and around people not in your household.  Hand Hygiene often Do NOT share personal items Notify your provider if you are in close contact with someone who has COVID or you develop fever 100.4 or greater, new onset of sneezing, cough, sore throat, shortness of breath or body aches.        Your procedure is scheduled on: Friday, 06-17-21   Report to [facility] Main  Entrance    Report to admitting at 10:15 AM   Call this number if you have problems the morning of surgery [phone]   Do not eat food :After Midnight.   May have liquids until 9:30 AM day of surgery  CLEAR LIQUID DIET  Foods Allowed                                                                     Foods Excluded  Water, Black Coffee (no milk/no creamer) and tea, regular and decaf                              liquids that you cannot  Plain Jell-O in any flavor  (No red)                          see through such as: Fruit ices (not with fruit pulp)                                 milk, soups, orange juice  Iced Popsicles (No red)                                    All solid food                             Apple juices Sports drinks like Gatorade (  No red) Lightly seasoned clear broth or consume(fat free) Sugar    Oral Hygiene is also important to reduce your risk of infection.                                    Remember - BRUSH YOUR TEETH THE MORNING OF SURGERY WITH YOUR REGULAR TOOTHPASTE   Do NOT smoke after Midnight  Take these medicines the morning of surgery with A SIP OF WATER:  Crestor, Paxil, Pantoprazole, Loratadine  How to Manage Your Diabetes Before and After Surgery  Why is it important to control my blood sugar before and after surgery? Improving blood sugar levels before and after surgery helps healing and can limit problems. A way of improving blood sugar control is eating a healthy diet by:  Eating less sugar and carbohydrates  Increasing activity/exercise  Talking with your doctor about reaching your blood sugar goals High blood sugars (greater than 180 mg/dL) can raise your risk of infections and slow your recovery, so you will need to focus on controlling your diabetes during the weeks before surgery. Make sure that the doctor who takes care of your diabetes knows about your planned surgery including the date and location.  How do I manage my blood sugar before surgery? Check your blood sugar at least 4 times a day, starting 2 days before surgery, to make sure that the level is not too high or low. Check your blood sugar the morning of your surgery when you wake up and every 2 hours until you get to the Short Stay unit. If your blood sugar is less than 70 mg/dL, you will need to treat for low blood sugar: Do not take insulin. Treat a low blood sugar (less than 70 mg/dL) with  cup of clear juice (cranberry or apple), 4 glucose  tablets, OR glucose gel. Recheck blood sugar in 15 minutes after treatment (to make sure it is greater than 70 mg/dL). If your blood sugar is not greater than 70 mg/dL on recheck, call [phone] for further instructions. Report your blood sugar to the short stay nurse when you get to Short Stay.  If you are admitted to the hospital after surgery: Your blood sugar will be checked by the staff and you will probably be given insulin after surgery (instead of oral diabetes medicines) to make sure you have good blood sugar levels. The goal for blood sugar control after surgery is 80-180 mg/dL.   WHAT DO I DO ABOUT MY DIABETES MEDICATION?  Do not take oral diabetes medicines (pills) the morning of surgery.  THE DAY BEFORE SURGERY:  Take Glipizide in the morning, do not take evening dose     THE MORNING OF SURGERY:  Do not take glipizide.  Reviewed and Endorsed by [facility] Patient Education Committee, August 2015                     Stop all vitamins and herbal supplements a week before surgery             You may not have any metal on your body including hair pins, jewelry, and body piercing             Do not wear make-up, lotions, powders, perfumes or deodorant  Do not wear nail polish including gel and S&S, artificial/acrylic nails, or any other type of covering on natural nails including finger and toenails. If  you have artificial nails, gel coating, etc. that needs to be removed by a nail salon please have this removed prior to surgery or surgery may need to be canceled/ delayed if the surgeon/ anesthesia feels like they are unable to be safely monitored.   Do not shave  48 hours prior to surgery.             Do not bring valuables to the hospital. [place].   Contacts, dentures or bridgework may not be worn into surgery.   Bring small overnight bag day of surgery.   Please read over the following fact sheets you were given: IF YOU HAVE  QUESTIONS ABOUT YOUR PRE OP INSTRUCTIONS PLEASE CALL [facility] - Preparing for Surgery Before surgery, you can play an important role.  Because skin is not sterile, your skin needs to be as free of germs as possible.  You can reduce the number of germs on your skin by washing with CHG (chlorahexidine gluconate) soap before surgery.  CHG is an antiseptic cleaner which kills germs and bonds with the skin to continue killing germs even after washing. Please DO NOT use if you have an allergy to CHG or antibacterial soaps.  If your skin becomes reddened/irritated stop using the CHG and inform your nurse when you arrive at Short Stay. Do not shave (including legs and underarms) for at least 48 hours prior to the first CHG shower.  You may shave your face/neck. Please follow these instructions carefully:  1.  Shower with CHG Soap the night before surgery and the  morning of Surgery.  2.  If you choose to wash your hair, wash your hair first as usual with your  normal  shampoo.  3.  After you shampoo, rinse your hair and body thoroughly to remove the  shampoo.                           4.  Use CHG as you would any other liquid soap.  You can apply chg directly  to the skin and wash                       Gently with a scrungie or clean washcloth.  5.  Apply the CHG Soap to your body ONLY FROM THE NECK DOWN.   Do not use on face/ open                           Wound or open sores. Avoid contact with eyes, ears mouth and genitals (private parts).                       Wash face,  Genitals (private parts) with your normal soap.             6.  Wash thoroughly, paying special attention to the area where your surgery  will be performed.  7.  Thoroughly rinse your body with warm water from the neck down.  8.  DO NOT shower/wash with your normal soap after using and rinsing off  the CHG Soap.                9.  Pat yourself dry with a clean towel.  10.  Wear clean  pajamas.            11.  Place clean sheets on your bed the night of your first shower and do not  sleep with pets. Day of Surgery : Do not apply any lotions/deodorants the morning of surgery.  Please wear clean clothes to the hospital/surgery center.  FAILURE TO FOLLOW THESE INSTRUCTIONS MAY RESULT IN THE CANCELLATION OF YOUR SURGERY PATIENT SIGNATURE_________________________________  NURSE SIGNATURE__________________________________  ________________________________________________________________________

## 2021-05-25 NOTE — Progress Notes (Signed)
Please place orders in epic pt. Is scheduled for preop 

## 2021-05-25 NOTE — Progress Notes (Signed)
PCP - Jacolyn Reedy, NP  LOV 05-06-21 epic Cardiologist -   PPM/ICD -  Device Orders -  Rep Notified -   Chest x-ray -  EKG -  Stress Test -  ECHO -  Cardiac Cath -   Sleep Study -  CPAP -   Fasting Blood Sugar -  Checks Blood Sugar _____ times a day  Blood Thinner Instructions: Aspirin Instructions:  ERAS Protcol - PRE-SURGERY Ensure or G2-   COVID TEST- 06-15-21 COVID vaccine -  Activity-- Anesthesia review: HgbA1c 9.9 /05-06-21 epic  Patient denies shortness of breath, fever, cough and chest pain at PAT appointment   All instructions explained to the patient, with a verbal understanding of the material. Patient agrees to go over the instructions while at home for a better understanding. Patient also instructed to self quarantine after being tested for COVID-19. The opportunity to ask questions was provided.

## 2021-05-29 NOTE — Progress Notes (Signed)
Cardiology Office Note:    Date:  05/30/2021   ID:  Kendra Miles, DOB June 20, 1951, MRN [id number]  PCP:  Orma Render, NP   [facility] Providers Cardiologist:  Werner Lean, MD     Referring MD: Kendra Juniper, MD   CC:  Preoperative visit  Hernia Repair General Dr. Louanna Miles  History of Present Illness:    Kendra Miles is a 70 y.o. female with a hx of HTN with DM, HLD with DM, prior thryoid disease, RBBB who presents for evaluation 05/30/21.  Patient notes that she is feeling good.  Has had no chest pain, chest pressure, chest tightness, chest stinging.   Patient exertion notable for working at [employer] and feels no symptoms.  No shortness of breat but does notes some DOE.  No PND or orthopnea.  No weight gain, or abdominal swelling.  Notes she has had multiple twisted ankles and has chronically swollen ankles; this has improved with compression stockings. Notes near syncope only with hot flashes. Notes no palpitations or funny heart beats.   Was asymptomatic when PCP heard irregular heart beat.   Patient reports prior cardiac testing including having a stress test done 10/04/20 that was negative.   Past Medical History:  Diagnosis Date   Acid reflux    Arthritis    Cholelithiasis with choledocholithiasis 02/2019   Diabetes ([place])    Headache    Hiatal hernia    Hyperlipidemia    Pneumonia    Polyp of stomach and duodenum     Past Surgical History:  Procedure Laterality Date   ABDOMINAL HYSTERECTOMY     partial   APPENDECTOMY     BIOPSY  03/08/2019   Procedure: BIOPSY;  Surgeon: Kendra Mayer, MD;  Location: [facility];  Service: Endoscopy;;   CHOLECYSTECTOMY N/A 03/09/2019   Procedure: laparoscopic cholecystectomy;  Surgeon: Kendra Kussmaul, MD;  Location: [facility];  Service: General;  Laterality: N/A;   CYST REMOVAL TRUNK     ERCP N/A 03/08/2019   Procedure: ENDOSCOPIC RETROGRADE CHOLANGIOPANCREATOGRAPHY (ERCP);  Surgeon: Kendra Mayer,  MD;  Location: [facility];  Service: Endoscopy;  Laterality: N/A;   ESOPHAGOGASTRODUODENOSCOPY (EGD) WITH PROPOFOL N/A 04/24/2019   Procedure: ESOPHAGOGASTRODUODENOSCOPY (EGD) WITH PROPOFOL;  Surgeon: Kendra Juniper, MD;  Location: [facility];  Service: Gastroenterology;  Laterality: N/A;   HEMOSTASIS CLIP PLACEMENT  04/24/2019   Procedure: HEMOSTASIS CLIP PLACEMENT;  Surgeon: Kendra Juniper, MD;  Location: [facility];  Service: Gastroenterology;;   PANCREATIC STENT PLACEMENT  03/08/2019   Procedure: PANCREATIC STENT PLACEMENT;  Surgeon: Kendra Mayer, MD;  Location: [facility];  Service: Endoscopy;;   POLYPECTOMY  04/24/2019   Procedure: POLYPECTOMY;  Surgeon: Kendra Juniper, MD;  Location: [facility];  Service: Gastroenterology;;   REMOVAL OF STONES  03/08/2019   Procedure: REMOVAL OF STONES;  Surgeon: Kendra Mayer, MD;  Location: [facility];  Service: Endoscopy;;   SPHINCTEROTOMY  03/08/2019   Procedure: Kendra Miles;  Surgeon: Kendra Mayer, MD;  Location: [facility];  Service: Endoscopy;;    Current Medications: Current Meds  Medication Sig   blood glucose meter kit and supplies Testing at least once per day (morning fasting), with option to test up to 4 times per day (morning fasting and as needed for symptoms) as needed for better control. Use only the strips that come in this kit with this meter- other strips will not work properly.   calcium carbonate (OSCAL) 1500 (600 Ca) MG TABS tablet Take  0.4 tablets (600 mg total) by mouth daily.   emollient lotion Apply topically 2 (two) times daily as needed. Apply to legs as needed for itching and dryness.   glipiZIDE (GLUCOTROL) 5 MG tablet Take 1 tablet (5 mg total) by mouth 2 (two) times daily before a meal.   lisinopril (ZESTRIL) 10 MG tablet Take 1 tablet (10 mg total) by mouth daily.   loratadine (CLARITIN) 10 MG tablet Take 1 tablet (10 mg total) by mouth daily.   meloxicam (MOBIC) 7.5 MG tablet Take 1 tablet (7.5 mg total) by  mouth daily.   Multiple Vitamin (MULTIVITAMIN WITH MINERALS) TABS tablet Take 1 tablet by mouth daily. Women's 50+ Multivitamin   Omega-3 Fatty Acids (FISH OIL PO) Take 2,000 mg by mouth daily.   ondansetron (ZOFRAN) 4 MG tablet Take 1 tablet (4 mg total) by mouth every 8 (eight) hours as needed for nausea or vomiting.   pantoprazole (PROTONIX) 40 MG tablet TAKE 1 TABLET BY MOUTH TWICE A DAY. AFTER FIRST 30 DAYS,DECREASE TO ONCE DAILY OR AS DIRECTED BY GI   PARoxetine (PAXIL) 30 MG tablet Take 1 tablet (30 mg total) by mouth daily.   rosuvastatin (CRESTOR) 10 MG tablet Take 1 tablet (10 mg total) by mouth daily.   rosuvastatin (CRESTOR) 5 MG tablet Take 5 mg by mouth daily.     Allergies:   Ciprofloxacin, Guaifenesin er, and Penicillins   Social History   Socioeconomic History   Marital status: Widowed    Spouse name: Not on file   Number of children: 4   Years of education: Not on file   Highest education level: Not on file  Occupational History   Occupation: Surveyor, quantity: [employer]    Comment: 27 years  Tobacco Use   Smoking status: Never   Smokeless tobacco: Never  Vaping Use   Vaping Use: Never used  Substance and Sexual Activity   Alcohol use: No   Drug use: Never   Sexual activity: Not Currently  Other Topics Concern   Not on file  Social History Narrative   Lives alone.    4 adult children, 3 sons and 1 daughter. One son lives in the area, other children live in [place] with 9 grandchildren and 2 great grandchildren +1 on the way. She visits them every year.    Mother (53) and Father (28) both alive and live in [place].       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    Social Determinants of Health   Financial Resource Strain: Not on file  Food Insecurity: Not on file  Transportation Needs: No Transportation Needs   Lack of Transportation (Medical): No   Lack of Transportation (Non-Medical):  No  Physical Activity: Insufficiently Active   Days of Exercise per Week: 2 days   Minutes of Exercise per Session: 10 min  Stress: Not on file  Social Connections: Not on file    Social: Works as a Scientist, water quality at [employer]  Family History: The patient's family history includes COPD in her father; Memory loss in her mother.  ROS:   Please see the history of present illness.     All other systems reviewed and are negative.  EKGs/Labs/Other Studies Reviewed:    The following studies were reviewed today:  EKG:  EKG is  ordered today.  The ekg ordered today demonstrates  05/30/21: SR rate RBBB QTc  502; JTC 430 05/30/21: SR Rate RBBB with occasional PACs  Recent Labs: 11/02/2020: TSH 1.580 05/06/2021: ALT 14 05/30/2021: BUN 21; Creatinine, Ser 0.79; Hemoglobin 15.0; Platelets 279; Potassium 4.6; Sodium 140  Recent Lipid Panel    Component Value Date/Time   CHOL 174 11/02/2020 0923   TRIG 182 (H) 03/08/2021 0835   HDL 37 (L) 11/02/2020 0923   CHOLHDL 4.7 11/02/2020 0923   VLDL 38 11/02/2020 0923   LDLCALC 99 11/02/2020 0923    Physical Exam:    VS:  BP 132/80 (BP Location: Right Arm, Patient Position: Sitting, Cuff Size: Normal)   Pulse 77   Ht '5\' 6"'  (1.676 m)   Wt 186 lb (84.4 kg)   SpO2 93%   BMI 30.02 kg/m     Wt Readings from Last 3 Encounters:  05/30/21 186 lb (84.4 kg)  05/30/21 185 lb 6 oz (84.1 kg)  05/06/21 185 lb (83.9 kg)    GEN:  Well nourished, well developed in no acute distress HEENT: Normal NECK: No JVD; No carotid bruits LYMPHATICS: No lymphadenopathy CARDIAC: IRIR no murmurs, rubs, gallops RESPIRATORY:  Clear to auscultation without rales, wheezing or rhonchi  ABDOMEN: Soft, non-tender, distended MUSCULOSKELETAL:  non-pitting edema; No deformity  SKIN: Warm and dry NEUROLOGIC:  Alert and oriented x 3 PSYCHIATRIC:  Normal affect   ASSESSMENT:    1. DOE (dyspnea on exertion)   2. RBBB   3. PAC (premature atrial contraction)   4. Hypertension  associated with diabetes ([place])   5. Hyperlipidemia associated with type 2 diabetes mellitus (HCC)    PLAN:    DOE RBBB and PACs (asymptomatic) HTN with DM HLD with DM - will get BMP and BNP; if this is WNL that reasonable for surgery (as below) and will send results to Dr. Chauncey Cruel - will get echo, unless issues with lab work as above, should not preclude surgery - continues meds -patient is not on ASA for primary prevention - PACs will likely not cause issues with her surgery (hernia  Preoperative Risk Assessment - The Revised Cardiac Risk Index = 0 which equates to 0.4%: very low riskv of perioperative myocardial infarction, pulmonary edema, ventricular fibrillation, cardiac arrest, or complete heart block.  - DASI score of 13 associated with 4.4 functional mets - No further cardiac testing is recommended prior to surgery.  - Our service is available as needed in the peri-operative period.    Will plan for five months follow up unless new symptoms or abnormal test results warranting change in plan  Would be reasonable for  APP Follow up   Medication Adjustments/Labs and Tests Ordered: Current medicines are reviewed at length with the patient today.  Concerns regarding medicines are outlined above.  Orders Placed This Encounter  Procedures   Basic metabolic panel   Pro b natriuretic peptide (BNP)   EKG 12-Lead   ECHOCARDIOGRAM COMPLETE    No orders of the defined types were placed in this encounter.   Patient Instructions  Medication Instructions:  Your physician recommends that you continue on your current medications as directed. Please refer to the Current Medication list given to you today.  *If you need a refill on your cardiac medications before your next appointment, please call your pharmacy*   Lab Work: TODAY: BNP, MP If you have labs (blood work) drawn today and your tests are completely normal, you will receive your results only by: [place] (if you have  MyChart) OR A paper copy in the mail If  you have any lab test that is abnormal or we need to change your treatment, we will call you to review the results.   Testing/Procedures: Your physician has requested that you have an echocardiogram. Echocardiography is a painless test that uses sound waves to create images of your heart. It provides your doctor with information about the size and shape of your heart and how well your heart's chambers and valves are working. This procedure takes approximately one hour. There are no restrictions for this procedure.    Follow-Up: At [facility], you and your health needs are our priority.  As part of our continuing mission to provide you with exceptional heart care, we have created designated Provider Care Teams.  These Care Teams include your primary Cardiologist (physician) and Advanced Practice Providers (APPs -  Physician Assistants and Nurse Practitioners) who all work together to provide you with the care you need, when you need it.    Your next appointment:   5 month(s)  The format for your next appointment:   In Person  Provider:   You may see Rudean Haskell, MD or one of the following Advanced Practice Providers on your designated Care Team:   Melina Copa, PA-C Ermalinda Barrios, PA-C        Signed, Werner Lean, MD  05/30/2021 4:32 PM    [place]

## 2021-05-30 ENCOUNTER — Encounter (HOSPITAL_COMMUNITY): Payer: Self-pay

## 2021-05-30 ENCOUNTER — Encounter (HOSPITAL_COMMUNITY)
Admission: RE | Admit: 2021-05-30 | Discharge: 2021-05-30 | Disposition: A | Discharge status: Home / Self Care | Payer: Medicare Other | Source: Ambulatory Visit | Attending: Surgery | Admitting: Surgery

## 2021-05-30 ENCOUNTER — Ambulatory Visit (INDEPENDENT_AMBULATORY_CARE_PROVIDER_SITE_OTHER): Payer: Medicare Other | Admitting: Internal Medicine

## 2021-05-30 ENCOUNTER — Other Ambulatory Visit: Payer: Self-pay

## 2021-05-30 ENCOUNTER — Encounter: Payer: Self-pay | Admitting: Internal Medicine

## 2021-05-30 VITALS — BP 132/80 | HR 77 | Ht 66.0 in | Wt 186.0 lb

## 2021-05-30 DIAGNOSIS — I491 Atrial premature depolarization: Secondary | ICD-10-CM

## 2021-05-30 DIAGNOSIS — I4510 Unspecified right bundle-branch block: Secondary | ICD-10-CM | POA: Insufficient documentation

## 2021-05-30 DIAGNOSIS — I152 Hypertension secondary to endocrine disorders: Secondary | ICD-10-CM

## 2021-05-30 DIAGNOSIS — Z01818 Encounter for other preprocedural examination: Secondary | ICD-10-CM | POA: Insufficient documentation

## 2021-05-30 DIAGNOSIS — R0609 Other forms of dyspnea: Secondary | ICD-10-CM | POA: Insufficient documentation

## 2021-05-30 DIAGNOSIS — E785 Hyperlipidemia, unspecified: Secondary | ICD-10-CM | POA: Diagnosis not present

## 2021-05-30 DIAGNOSIS — E1159 Type 2 diabetes mellitus with other circulatory complications: Secondary | ICD-10-CM

## 2021-05-30 DIAGNOSIS — E1169 Type 2 diabetes mellitus with other specified complication: Secondary | ICD-10-CM

## 2021-05-30 HISTORY — DX: Pneumonia, unspecified organism: J18.9

## 2021-05-30 HISTORY — DX: Unspecified osteoarthritis, unspecified site: M19.90

## 2021-05-30 HISTORY — DX: Headache, unspecified: R51.9

## 2021-05-30 LAB — CBC
HCT: 45 % (ref 36.0–46.0)
Hemoglobin: 15 g/dL (ref 12.0–15.0)
MCH: 29.8 pg (ref 26.0–34.0)
MCHC: 33.3 g/dL (ref 30.0–36.0)
MCV: 89.3 fL (ref 80.0–100.0)
Platelets: 279 10*3/uL (ref 150–400)
RBC: 5.04 MIL/uL (ref 3.87–5.11)
RDW: 13.4 % (ref 11.5–15.5)
WBC: 7.6 10*3/uL (ref 4.0–10.5)
nRBC: 0 % (ref 0.0–0.2)

## 2021-05-30 LAB — GLUCOSE, CAPILLARY: Glucose-Capillary: 173 mg/dL — ABNORMAL HIGH (ref 70–99)

## 2021-05-30 LAB — BASIC METABOLIC PANEL
Anion gap: 7 (ref 5–15)
BUN: 21 mg/dL (ref 8–23)
CO2: 27 mmol/L (ref 22–32)
Calcium: 9.4 mg/dL (ref 8.9–10.3)
Chloride: 106 mmol/L (ref 98–111)
Creatinine, Ser: 0.79 mg/dL (ref 0.44–1.00)
GFR, Estimated: >60 mL/min (ref >60)
Glucose, Bld: 194 mg/dL — ABNORMAL HIGH (ref 70–99)
Potassium: 4.6 mmol/L (ref 3.5–5.1)
Sodium: 140 mmol/L (ref 135–145)

## 2021-05-30 NOTE — Patient Instructions (Signed)
Medication Instructions:  Your physician recommends that you continue on your current medications as directed. Please refer to the Current Medication list given to you today.  *If you need a refill on your cardiac medications before your next appointment, please call your pharmacy*   Lab Work: TODAY: BNP, MP If you have labs (blood work) drawn today and your tests are completely normal, you will receive your results only by: [place] (if you have MyChart) OR A paper copy in the mail If you have any lab test that is abnormal or we need to change your treatment, we will call you to review the results.   Testing/Procedures: Your physician has requested that you have an echocardiogram. Echocardiography is a painless test that uses sound waves to create images of your heart. It provides your doctor with information about the size and shape of your heart and how well your heart's chambers and valves are working. This procedure takes approximately one hour. There are no restrictions for this procedure.    Follow-Up: At [facility], you and your health needs are our priority.  As part of our continuing mission to provide you with exceptional heart care, we have created designated Provider Care Teams.  These Care Teams include your primary Cardiologist (physician) and Advanced Practice Providers (APPs -  Physician Assistants and Nurse Practitioners) who all work together to provide you with the care you need, when you need it.    Your next appointment:   5 month(s)  The format for your next appointment:   In Person  Provider:   You may see Rudean Haskell, MD or one of the following Advanced Practice Providers on your designated Care Team:   Melina Copa, PA-C Ermalinda Barrios, PA-C

## 2021-05-30 NOTE — Addendum Note (Signed)
Addended by: Precious Gilding on: 05/30/2021 04:36 PM   Modules accepted: Orders

## 2021-05-30 NOTE — Progress Notes (Addendum)
COVID swab appointment: 06-15-21  COVID Vaccine Completed:  Yes x3  Date COVID Vaccine completed:   Has received booster: Yes x1 COVID vaccine manufacturer: [place]     Date of COVID positive in last 90 days:  N/A  PCP - Jacolyn Reedy, NP Cardiologist - [place] previous and due to see and see Dr. Gasper Sells on 05/30/21.  Requested notes from [place] clearance in note dated 05-30-21 by Dr. Gasper Sells  Chest x-ray - N/A EKG - 05-30-21 Epic Stress Test - 10-04-20 at [facility] - N/A Cardiac Cath - N/A Pacemaker/ICD device last checked: Spinal Cord Stimulator:  Sleep Study - N/A CPAP -   Fasting Blood Sugar - Does not check at home.   Checks Blood Sugar _____ times a day  Blood Thinner Instructions:   Aspirin Instructions: ASA 81 mg, stopped in June Last Dose:  Activity level:  Can go up a flight of stairs and perform activities of daily living without stopping and without symptoms of chest pain or shortness of breath.    Anesthesia review:  Followed by cardiology for abnormal EKG.  HTN, RBBB  A1c 9.9  Patient denies shortness of breath, fever, cough and chest pain at PAT appointment   Patient verbalized understanding of instructions that were given to them at the PAT appointment. Patient was also instructed that they will need to review over the PAT instructions again at home before surgery.

## 2021-05-31 LAB — PRO B NATRIURETIC PEPTIDE: NT-Pro BNP: 81 pg/mL (ref 0–301)

## 2021-06-06 ENCOUNTER — Encounter (HOSPITAL_BASED_OUTPATIENT_CLINIC_OR_DEPARTMENT_OTHER): Payer: Self-pay | Admitting: Nurse Practitioner

## 2021-06-07 ENCOUNTER — Encounter (HOSPITAL_COMMUNITY): Payer: Self-pay | Admitting: Physician Assistant

## 2021-06-08 ENCOUNTER — Ambulatory Visit (HOSPITAL_COMMUNITY): Payer: Medicare Other | Attending: Cardiology

## 2021-06-08 ENCOUNTER — Encounter (HOSPITAL_COMMUNITY): Payer: Self-pay

## 2021-06-09 ENCOUNTER — Encounter (HOSPITAL_COMMUNITY): Payer: Self-pay | Admitting: Internal Medicine

## 2021-06-10 ENCOUNTER — Inpatient Hospital Stay (HOSPITAL_COMMUNITY)
Admission: EM | Admit: 2021-06-10 | Discharge: 2021-06-15 | DRG: 327 | Disposition: A | Discharge status: Home / Self Care | Payer: Medicare Other | Attending: Surgery | Admitting: Surgery

## 2021-06-10 ENCOUNTER — Emergency Department (HOSPITAL_COMMUNITY): Payer: Medicare Other

## 2021-06-10 ENCOUNTER — Ambulatory Visit (INDEPENDENT_AMBULATORY_CARE_PROVIDER_SITE_OTHER): Payer: Medicare Other | Admitting: Nurse Practitioner

## 2021-06-10 ENCOUNTER — Encounter (HOSPITAL_BASED_OUTPATIENT_CLINIC_OR_DEPARTMENT_OTHER): Payer: Self-pay | Admitting: Nurse Practitioner

## 2021-06-10 ENCOUNTER — Other Ambulatory Visit: Payer: Self-pay

## 2021-06-10 ENCOUNTER — Encounter (HOSPITAL_COMMUNITY): Payer: Self-pay

## 2021-06-10 VITALS — Ht 66.0 in | Wt 190.0 lb

## 2021-06-10 DIAGNOSIS — Z79899 Other long term (current) drug therapy: Secondary | ICD-10-CM | POA: Diagnosis not present

## 2021-06-10 DIAGNOSIS — I152 Hypertension secondary to endocrine disorders: Secondary | ICD-10-CM | POA: Diagnosis present

## 2021-06-10 DIAGNOSIS — R1115 Cyclical vomiting syndrome unrelated to migraine: Secondary | ICD-10-CM

## 2021-06-10 DIAGNOSIS — K440 Diaphragmatic hernia with obstruction, without gangrene: Principal | ICD-10-CM | POA: Diagnosis present

## 2021-06-10 DIAGNOSIS — Z7984 Long term (current) use of oral hypoglycemic drugs: Secondary | ICD-10-CM | POA: Diagnosis not present

## 2021-06-10 DIAGNOSIS — Z791 Long term (current) use of non-steroidal anti-inflammatories (NSAID): Secondary | ICD-10-CM

## 2021-06-10 DIAGNOSIS — Z888 Allergy status to other drugs, medicaments and biological substances status: Secondary | ICD-10-CM | POA: Diagnosis not present

## 2021-06-10 DIAGNOSIS — Z7982 Long term (current) use of aspirin: Secondary | ICD-10-CM

## 2021-06-10 DIAGNOSIS — K449 Diaphragmatic hernia without obstruction or gangrene: Secondary | ICD-10-CM | POA: Diagnosis not present

## 2021-06-10 DIAGNOSIS — R935 Abnormal findings on diagnostic imaging of other abdominal regions, including retroperitoneum: Secondary | ICD-10-CM | POA: Diagnosis not present

## 2021-06-10 DIAGNOSIS — Z20822 Contact with and (suspected) exposure to covid-19: Secondary | ICD-10-CM | POA: Diagnosis present

## 2021-06-10 DIAGNOSIS — Z880 Allergy status to penicillin: Secondary | ICD-10-CM | POA: Diagnosis not present

## 2021-06-10 DIAGNOSIS — K3189 Other diseases of stomach and duodenum: Secondary | ICD-10-CM | POA: Diagnosis not present

## 2021-06-10 DIAGNOSIS — E785 Hyperlipidemia, unspecified: Secondary | ICD-10-CM | POA: Diagnosis present

## 2021-06-10 DIAGNOSIS — K311 Adult hypertrophic pyloric stenosis: Secondary | ICD-10-CM | POA: Diagnosis present

## 2021-06-10 DIAGNOSIS — E119 Type 2 diabetes mellitus without complications: Secondary | ICD-10-CM | POA: Diagnosis not present

## 2021-06-10 DIAGNOSIS — E1169 Type 2 diabetes mellitus with other specified complication: Secondary | ICD-10-CM | POA: Diagnosis present

## 2021-06-10 DIAGNOSIS — R112 Nausea with vomiting, unspecified: Secondary | ICD-10-CM | POA: Diagnosis not present

## 2021-06-10 DIAGNOSIS — Z881 Allergy status to other antibiotic agents status: Secondary | ICD-10-CM

## 2021-06-10 DIAGNOSIS — K219 Gastro-esophageal reflux disease without esophagitis: Secondary | ICD-10-CM | POA: Diagnosis present

## 2021-06-10 DIAGNOSIS — K56609 Unspecified intestinal obstruction, unspecified as to partial versus complete obstruction: Secondary | ICD-10-CM | POA: Diagnosis not present

## 2021-06-10 DIAGNOSIS — Z794 Long term (current) use of insulin: Secondary | ICD-10-CM | POA: Diagnosis not present

## 2021-06-10 DIAGNOSIS — E1165 Type 2 diabetes mellitus with hyperglycemia: Secondary | ICD-10-CM | POA: Diagnosis not present

## 2021-06-10 DIAGNOSIS — R1110 Vomiting, unspecified: Secondary | ICD-10-CM | POA: Diagnosis not present

## 2021-06-10 DIAGNOSIS — R000 Tachycardia, unspecified: Secondary | ICD-10-CM | POA: Diagnosis not present

## 2021-06-10 DIAGNOSIS — K562 Volvulus: Secondary | ICD-10-CM | POA: Diagnosis not present

## 2021-06-10 DIAGNOSIS — R1084 Generalized abdominal pain: Secondary | ICD-10-CM | POA: Diagnosis not present

## 2021-06-10 HISTORY — DX: Other diseases of stomach and duodenum: K31.89

## 2021-06-10 HISTORY — DX: Cyclical vomiting syndrome unrelated to migraine: R11.15

## 2021-06-10 LAB — COMPREHENSIVE METABOLIC PANEL
ALT: 18 U/L (ref 0–44)
AST: 24 U/L (ref 15–41)
Albumin: 5 g/dL (ref 3.5–5.0)
Alkaline Phosphatase: 97 U/L (ref 38–126)
Anion gap: 19 — ABNORMAL HIGH (ref 5–15)
BUN: 39 mg/dL — ABNORMAL HIGH (ref 8–23)
CO2: 24 mmol/L (ref 22–32)
Calcium: 9.7 mg/dL (ref 8.9–10.3)
Chloride: 95 mmol/L — ABNORMAL LOW (ref 98–111)
Creatinine, Ser: 0.88 mg/dL (ref 0.44–1.00)
GFR, Estimated: >60 mL/min (ref >60)
Glucose, Bld: 304 mg/dL — ABNORMAL HIGH (ref 70–99)
Potassium: 3.7 mmol/L (ref 3.5–5.1)
Sodium: 138 mmol/L (ref 135–145)
Total Bilirubin: 1.6 mg/dL — ABNORMAL HIGH (ref 0.3–1.2)
Total Protein: 8.5 g/dL — ABNORMAL HIGH (ref 6.5–8.1)

## 2021-06-10 LAB — CBC WITH DIFFERENTIAL/PLATELET
Abs Immature Granulocytes: 0.14 10*3/uL — ABNORMAL HIGH (ref 0.00–0.07)
Basophils Absolute: 0 10*3/uL (ref 0.0–0.1)
Basophils Relative: 0 %
Eosinophils Absolute: 0 10*3/uL (ref 0.0–0.5)
Eosinophils Relative: 0 %
HCT: 55.5 % — ABNORMAL HIGH (ref 36.0–46.0)
Hemoglobin: 18.8 g/dL — ABNORMAL HIGH (ref 12.0–15.0)
Immature Granulocytes: 1 %
Lymphocytes Relative: 8 %
Lymphs Abs: 1.8 10*3/uL (ref 0.7–4.0)
MCH: 29.6 pg (ref 26.0–34.0)
MCHC: 33.9 g/dL (ref 30.0–36.0)
MCV: 87.3 fL (ref 80.0–100.0)
Monocytes Absolute: 1.4 10*3/uL — ABNORMAL HIGH (ref 0.1–1.0)
Monocytes Relative: 6 %
Neutro Abs: 19.9 10*3/uL — ABNORMAL HIGH (ref 1.7–7.7)
Neutrophils Relative %: 85 %
Platelets: 408 10*3/uL — ABNORMAL HIGH (ref 150–400)
RBC: 6.36 MIL/uL — ABNORMAL HIGH (ref 3.87–5.11)
RDW: 13.5 % (ref 11.5–15.5)
WBC: 23.2 10*3/uL — ABNORMAL HIGH (ref 4.0–10.5)
nRBC: 0 % (ref 0.0–0.2)

## 2021-06-10 LAB — URINALYSIS, ROUTINE W REFLEX MICROSCOPIC
Bilirubin Urine: NEGATIVE
Glucose, UA: >=500 mg/dL — AB
Ketones, ur: 80 mg/dL — AB
Nitrite: NEGATIVE
Protein, ur: 100 mg/dL — AB
Specific Gravity, Urine: 1.03 (ref 1.005–1.030)
WBC, UA: >50 WBC/hpf — ABNORMAL HIGH (ref 0–5)
pH: 5 (ref 5.0–8.0)

## 2021-06-10 LAB — RESP PANEL BY RT-PCR (FLU A&B, COVID) ARPGX2
Influenza A by PCR: NEGATIVE
Influenza B by PCR: NEGATIVE
SARS Coronavirus 2 by RT PCR: NEGATIVE

## 2021-06-10 LAB — LIPASE, BLOOD: Lipase: 17 U/L (ref 11–51)

## 2021-06-10 MED ORDER — SODIUM CHLORIDE 0.9 % IV BOLUS
1000.0000 mL | Freq: Once | INTRAVENOUS | Status: AC
  Administered 2021-06-10: 1000 mL via INTRAVENOUS

## 2021-06-10 MED ORDER — ONDANSETRON HCL 4 MG/2ML IJ SOLN
4.0000 mg | Freq: Once | INTRAMUSCULAR | Status: AC
  Administered 2021-06-10: 4 mg via INTRAVENOUS
  Filled 2021-06-10: qty 2

## 2021-06-10 MED ORDER — HYDROMORPHONE HCL 1 MG/ML IJ SOLN
1.0000 mg | Freq: Once | INTRAMUSCULAR | Status: AC
Start: 2021-06-10 — End: 2021-06-10
  Administered 2021-06-10: 1 mg via INTRAVENOUS
  Filled 2021-06-10: qty 1

## 2021-06-10 MED ORDER — PANTOPRAZOLE 80MG IVPB - SIMPLE MED
80.0000 mg | Freq: Once | INTRAVENOUS | Status: AC
  Administered 2021-06-10: 80 mg via INTRAVENOUS
  Filled 2021-06-10: qty 80

## 2021-06-10 MED ORDER — INSULIN ASPART 100 UNIT/ML IJ SOLN
0.0000 [IU] | Freq: Three times a day (TID) | INTRAMUSCULAR | Status: DC
  Administered 2021-06-11: 3 [IU] via SUBCUTANEOUS
  Administered 2021-06-11 (×2): 5 [IU] via SUBCUTANEOUS
  Administered 2021-06-12 (×3): 3 [IU] via SUBCUTANEOUS
  Administered 2021-06-13: 5 [IU] via SUBCUTANEOUS
  Administered 2021-06-14 (×3): 2 [IU] via SUBCUTANEOUS
  Filled 2021-06-10: qty 0.15

## 2021-06-10 MED ORDER — ONDANSETRON HCL 4 MG/2ML IJ SOLN
4.0000 mg | Freq: Four times a day (QID) | INTRAMUSCULAR | Status: DC | PRN
  Administered 2021-06-10 – 2021-06-13 (×5): 4 mg via INTRAVENOUS
  Filled 2021-06-10 (×5): qty 2

## 2021-06-10 MED ORDER — IOHEXOL 350 MG/ML SOLN
80.0000 mL | Freq: Once | INTRAVENOUS | Status: AC | PRN
  Administered 2021-06-10: 80 mL via INTRAVENOUS

## 2021-06-10 MED ORDER — PANTOPRAZOLE SODIUM 40 MG IV SOLR
40.0000 mg | Freq: Two times a day (BID) | INTRAVENOUS | Status: DC
  Administered 2021-06-14: 40 mg via INTRAVENOUS
  Filled 2021-06-10: qty 40

## 2021-06-10 MED ORDER — ENOXAPARIN SODIUM 40 MG/0.4ML IJ SOSY
40.0000 mg | PREFILLED_SYRINGE | INTRAMUSCULAR | Status: DC
  Administered 2021-06-10 – 2021-06-14 (×5): 40 mg via SUBCUTANEOUS
  Filled 2021-06-10 (×5): qty 0.4

## 2021-06-10 MED ORDER — PANTOPRAZOLE INFUSION (NEW) - SIMPLE MED
8.0000 mg/h | INTRAVENOUS | Status: AC
  Administered 2021-06-10 – 2021-06-11 (×2): 8 mg/h via INTRAVENOUS
  Filled 2021-06-10 (×2): qty 100
  Filled 2021-06-10 (×2): qty 80

## 2021-06-10 MED ORDER — SODIUM CHLORIDE 0.9 % IV SOLN
INTRAVENOUS | Status: DC
Start: 2021-06-10 — End: 2021-06-15

## 2021-06-10 MED ORDER — DEXTROSE-NACL 5-0.9 % IV SOLN
INTRAVENOUS | Status: DC
Start: 2021-06-10 — End: 2021-06-13

## 2021-06-10 MED ORDER — HYDROMORPHONE HCL 1 MG/ML IJ SOLN
1.0000 mg | INTRAMUSCULAR | Status: DC | PRN
  Administered 2021-06-10 – 2021-06-11 (×2): 2 mg via INTRAVENOUS
  Administered 2021-06-11 – 2021-06-12 (×3): 1 mg via INTRAVENOUS
  Filled 2021-06-10: qty 2
  Filled 2021-06-10: qty 1
  Filled 2021-06-10: qty 2
  Filled 2021-06-10 (×2): qty 1
  Filled 2021-06-10: qty 2

## 2021-06-10 MED ORDER — ONDANSETRON 4 MG PO TBDP: 4.0000 mg | ORAL_TABLET | Freq: Four times a day (QID) | ORAL | Status: DC | PRN

## 2021-06-10 NOTE — ED Provider Notes (Signed)
[place] DEPT Provider Note   CSN: [id number] Arrival date & time: 06/10/21  1430     History Chief Complaint  Patient presents with   Emesis    Kendra Miles is a 70 y.o. female.  Patient with a known history of large hiatal hernia.  Patient scheduled for surgical repair of that on October 28.  Patient's had vomiting every day since Tuesday.  Yesterday the vomit was very dark to black in color.  This is happened before.  Today it is just green.  Patient not able to keep anything down.  Associated with epigastric abdominal pain.  Past medical history significant otherwise for diabetes acid reflux hyperlipidemia.  Patient's had her gallbladder removed and had her appendix removed.      Past Medical History:  Diagnosis Date   Acid reflux    Arthritis    Cholelithiasis with choledocholithiasis 02/2019   Diabetes ([place])    Headache    Hiatal hernia    Hyperlipidemia    Pneumonia    Polyp of stomach and duodenum     Patient Active Problem List   Diagnosis Date Noted   Emesis, persistent 06/10/2021   DOE (dyspnea on exertion) 05/30/2021   RBBB 05/30/2021   PAC (premature atrial contraction) 05/30/2021   Fall 05/06/2021   Upper GI bleed 02/03/2021   Flatulence, eructation and gas pain 11/03/2020   Back pain 11/02/2020   Chronic headaches 11/02/2020   Lower extremity edema 11/02/2020   Prolapsed bladder 11/02/2020   Vitamin D deficiency 11/02/2020   Encounter to establish care with new doctor 11/02/2020   Actinic keratosis 11/02/2020   Non-seasonal allergic rhinitis due to pollen 11/02/2020   Calcium deficiency 11/02/2020   Menopausal symptoms 11/02/2020   Polyp of stomach and duodenum    Choledocholithiasis with obstruction 03/06/2019   Hypertension associated with diabetes ([place]) 03/06/2019   Type 2 diabetes mellitus with hyperglycemia, with long-term current use of insulin ([place]) 03/06/2019   GERD (gastroesophageal reflux disease)  03/06/2019   Hyperlipidemia associated with type 2 diabetes mellitus ([place]) 04/17/2011   Thyroid nodule 04/17/2011   Nontoxic multinodular goiter 12/03/2007    Past Surgical History:  Procedure Laterality Date   ABDOMINAL HYSTERECTOMY     partial   APPENDECTOMY     BIOPSY  03/08/2019   Procedure: BIOPSY;  Surgeon: Gatha Mayer, MD;  Location: [facility];  Service: Endoscopy;;   CHOLECYSTECTOMY N/A 03/09/2019   Procedure: laparoscopic cholecystectomy;  Surgeon: Jovita Kussmaul, MD;  [facility]: [facility];  Service: General;  Laterality: N/A;   CYST REMOVAL TRUNK     ERCP N/A 03/08/2019   Procedure: ENDOSCOPIC RETROGRADE CHOLANGIOPANCREATOGRAPHY (ERCP);  Surgeon: Gatha Mayer, MD;  Location: [facility];  Service: Endoscopy;  Laterality: N/A;   ESOPHAGOGASTRODUODENOSCOPY (EGD) WITH PROPOFOL N/A 04/24/2019   Procedure: ESOPHAGOGASTRODUODENOSCOPY (EGD) WITH PROPOFOL;  Surgeon: Ronnette Juniper, MD;  [facility];  Service: Gastroenterology;  Laterality: N/A;   HEMOSTASIS CLIP PLACEMENT  04/24/2019   Procedure: HEMOSTASIS CLIP PLACEMENT;  Surgeon: Ronnette Juniper, MD;  [facility];  Service: Gastroenterology;;   PANCREATIC STENT PLACEMENT  03/08/2019   Procedure: PANCREATIC STENT PLACEMENT;  Surgeon: Gatha Mayer, MD;  [facility];  Service: Endoscopy;;   POLYPECTOMY  04/24/2019   Procedure: POLYPECTOMY;  Surgeon: Ronnette Juniper, MD;  [facility];  Service: Gastroenterology;;   REMOVAL OF STONES  03/08/2019   Procedure: REMOVAL OF STONES;  Surgeon: Gatha Mayer, MD;  [facility];  Service: Endoscopy;;   SPHINCTEROTOMY  03/08/2019   Procedure: SPHINCTEROTOMY;  Surgeon: Gatha Mayer, MD;  Location: [facility];  Service: Endoscopy;;     OB History   No obstetric history on file.     Family History  Problem Relation Age of Onset   Memory loss Mother    COPD Father     Social History   Tobacco Use   Smoking status: Never   Smokeless  tobacco: Never  Vaping Use   Vaping Use: Never used  Substance Use Topics   Alcohol use: No   Drug use: Never    Home Medications Prior to Admission medications   Medication Sig Start Date End Date Taking? Authorizing Provider  glipiZIDE (GLUCOTROL) 5 MG tablet Take 1 tablet (5 mg total) by mouth 2 (two) times daily before a meal. 05/06/21  Yes Early, Coralee Pesa, NP  lisinopril (ZESTRIL) 10 MG tablet Take 1 tablet (10 mg total) by mouth daily. 05/06/21  Yes Early, Coralee Pesa, NP  loratadine (CLARITIN) 10 MG tablet Take 1 tablet (10 mg total) by mouth daily. 02/04/21  Yes Early, Coralee Pesa, NP  meloxicam (MOBIC) 7.5 MG tablet Take 1 tablet (7.5 mg total) by mouth daily. 05/06/21  Yes Early, Coralee Pesa, NP  Multiple Vitamin (MULTIVITAMIN WITH MINERALS) TABS tablet Take 1 tablet by mouth daily. Women's 50+ Multivitamin   Yes [provider]  Omega-3 Fatty Acids (FISH OIL PO) Take 2,000 mg by mouth daily.   Yes [provider]  ondansetron (ZOFRAN) 4 MG tablet Take 1 tablet (4 mg total) by mouth every 8 (eight) hours as needed for nausea or vomiting. 05/06/21  Yes Early, Coralee Pesa, NP  pantoprazole (PROTONIX) 40 MG tablet TAKE 1 TABLET BY MOUTH TWICE A DAY. AFTER FIRST 30 DAYS,DECREASE TO ONCE DAILY OR AS DIRECTED BY GI 04/28/21  Yes Early, Coralee Pesa, NP  PARoxetine (PAXIL) 30 MG tablet Take 1 tablet (30 mg total) by mouth daily. 02/03/21  Yes Early, Coralee Pesa, NP  rosuvastatin (CRESTOR) 5 MG tablet Take 5 mg by mouth daily.   Yes [provider]  aspirin 81 MG chewable tablet Chew 1 tablet (81 mg total) by mouth daily. Patient not taking: No sig reported 11/02/20   Early, Coralee Pesa, NP  blood glucose meter kit and supplies Testing at least once per day (morning fasting), with option to test up to 4 times per day (morning fasting and as needed for symptoms) as needed for better control. Use only the strips that come in this kit with this meter- other strips will not work properly. 11/02/20   Orma Render,  NP  calcium carbonate (OSCAL) 1500 (600 Ca) MG TABS tablet Take 0.4 tablets (600 mg total) by mouth daily. Patient not taking: Reported on 06/10/2021 11/02/20   Orma Render, NP  emollient lotion Apply topically 2 (two) times daily as needed. Apply to legs as needed for itching and dryness. Patient not taking: Reported on 06/10/2021 11/02/20   Early, Coralee Pesa, NP  lisinopril (ZESTRIL) 5 MG tablet Take 5 mg by mouth daily. 05/27/21   [provider]  metFORMIN (GLUCOPHAGE) 500 MG tablet Take 1 tablet (500 mg total) by mouth 2 (two) times daily with a meal. Patient not taking: No sig reported 05/06/21   Early, Coralee Pesa, NP  rosuvastatin (CRESTOR) 10 MG tablet Take 1 tablet (10 mg total) by mouth daily. Patient not taking: Reported on 06/10/2021 03/18/21   Early, Coralee Pesa, NP  Allergies    Ciprofloxacin, Guaifenesin er, and Penicillins  Review of Systems   Review of Systems  Constitutional:  Negative for chills and fever.  HENT:  Negative for ear pain and sore throat.   Eyes:  Negative for pain and visual disturbance.  Respiratory:  Negative for cough and shortness of breath.   Cardiovascular:  Negative for chest pain and palpitations.  Gastrointestinal:  Positive for abdominal pain, nausea and vomiting. Negative for anal bleeding and blood in stool.  Genitourinary:  Negative for dysuria and hematuria.  Musculoskeletal:  Negative for arthralgias and back pain.  Skin:  Negative for color change and rash.  Neurological:  Negative for seizures and syncope.  All other systems reviewed and are negative.  Physical Exam Updated Vital Signs BP (!) 143/76   Pulse (!) 108   Temp 97.6 F (36.4 C) (Oral)   Resp 18   SpO2 96%   Physical Exam Vitals and nursing note reviewed.  Constitutional:      General: She is not in acute distress.    Appearance: Normal appearance. She is well-developed.  HENT:     Head: Normocephalic and atraumatic.     Mouth/Throat:     Mouth: Mucous membranes are  dry.  Eyes:     Extraocular Movements: Extraocular movements intact.     Conjunctiva/sclera: Conjunctivae normal.     Pupils: Pupils are equal, round, and reactive to light.  Cardiovascular:     Rate and Rhythm: Regular rhythm. Tachycardia present.     Heart sounds: No murmur heard. Pulmonary:     Effort: Pulmonary effort is normal. No respiratory distress.     Breath sounds: Normal breath sounds.  Abdominal:     Palpations: Abdomen is soft.     Tenderness: There is no abdominal tenderness.     Comments: Tenderness to palpation to the epigastric part of the abdomen.  No guarding  Musculoskeletal:        General: No swelling. Normal range of motion.     Cervical back: Normal range of motion and neck supple.  Skin:    General: Skin is warm and dry.  Neurological:     General: No focal deficit present.     Mental Status: She is alert and oriented to person, place, and time.    ED Results / Procedures / Treatments   Labs (all labs ordered are listed, but only abnormal results are displayed) Labs Reviewed  CBC WITH DIFFERENTIAL/PLATELET - Abnormal; Notable for the following components:      Result Value   WBC 23.2 (*)    RBC 6.36 (*)    Hemoglobin 18.8 (*)    HCT 55.5 (*)    Platelets 408 (*)    Neutro Abs 19.9 (*)    Monocytes Absolute 1.4 (*)    Abs Immature Granulocytes 0.14 (*)    All other components within normal limits  COMPREHENSIVE METABOLIC PANEL - Abnormal; Notable for the following components:   Chloride 95 (*)    Glucose, Bld 304 (*)    BUN 39 (*)    Total Protein 8.5 (*)    Total Bilirubin 1.6 (*)    Anion gap 19 (*)    All other components within normal limits  URINALYSIS, ROUTINE W REFLEX MICROSCOPIC - Abnormal; Notable for the following components:   APPearance HAZY (*)    Glucose, UA >=500 (*)    Hgb urine dipstick SMALL (*)    Ketones, ur 80 (*)    Protein, ur  100 (*)    Leukocytes,Ua LARGE (*)    WBC, UA >50 (*)    Bacteria, UA RARE (*)    All  other components within normal limits  RESP PANEL BY RT-PCR (FLU A&B, COVID) ARPGX2  LIPASE, BLOOD  POC OCCULT BLOOD, ED    EKG None  Radiology CT Abdomen Pelvis W Contrast  Result Date: 06/10/2021 CLINICAL DATA:  Nausea and vomiting EXAM: CT ABDOMEN AND PELVIS WITH CONTRAST TECHNIQUE: Multidetector CT imaging of the abdomen and pelvis was performed using the standard protocol following bolus administration of intravenous contrast. CONTRAST:  48m OMNIPAQUE IOHEXOL 350 MG/ML SOLN COMPARISON:  CT 01/13/2020 FINDINGS: Lower chest: Lung bases demonstrate partial atelectasis at the left base. No pleural effusion. Large hiatal hernia. Hepatobiliary: No focal liver abnormality is seen. Status post cholecystectomy. No biliary dilatation. Pancreas: Unremarkable. No pancreatic ductal dilatation or surrounding inflammatory changes. Spleen: Stable calcified lesion in the spleen. Adrenals/Urinary Tract: Adrenal glands are normal. Kidneys show no hydronephrosis. Subcentimeter hypodensities too small to further characterize. Bladder unremarkable. Stomach/Bowel: Marked fluid distension of the stomach, edema thus appearing gastric outlet, findings suspicious for gastric outlet obstruction with gastric configuration suspect for volvulus. No dilated small bowel. No acute bowel wall thickening. Vascular/Lymphatic: Mild aortic atherosclerosis. No aneurysm. No suspicious nodes. Reproductive: Status post hysterectomy. Asymmetrically enlarged right ovary with central low-density lesion measuring 28 x 19 mm, previously 31 x 15 mm. This likely represents benign cyst given lack of significant interval change. Other: Negative for pelvic effusion or free air. Musculoskeletal: No acute or significant osseous findings. IMPRESSION: 1. Large hiatal hernia. Marked fluid distension of the stomach with edematous appearing gastric outlet, concerning for gastric outlet obstruction. Configuration of the stomach is suspicious for gastric  volvulus (? mesenteroaxial). The remainder of the small bowel and colon are otherwise unremarkable. 2. Other chronic findings as previously described. Electronically Signed   By: KDonavan FoilM.D.   On: 06/10/2021 19:04   DG ABD ACUTE 2+V W 1V CHEST  Result Date: 06/10/2021 CLINICAL DATA:  Nausea and vomiting, history of hiatal hernia EXAM: DG ABDOMEN ACUTE WITH 1 VIEW CHEST COMPARISON:  04/21/2019, 01/13/2020 FINDINGS: Supine and upright frontal views of the abdomen and pelvis as well as an upright frontal view of the chest are obtained. Cardiac silhouette is unremarkable. Large hiatal hernia again identified unchanged since prior CT. No acute airspace disease, effusion, or pneumothorax. Bowel gas pattern is unremarkable without obstruction or ileus. There are no masses or abnormal calcifications. No free gas within the greater peritoneal sac. No acute or destructive bony lesions. IMPRESSION: 1. Large hiatal hernia unchanged. 2. No acute intrathoracic process. 3. Unremarkable bowel gas pattern. Electronically Signed   By: MRanda NgoM.D.   On: 06/10/2021 15:33    Procedures Procedures   Medications Ordered in ED Medications  0.9 %  sodium chloride infusion ( Intravenous New Bag/Given 06/10/21 1859)  pantoprozole (PROTONIX) 80 mg /NS 100 mL infusion (8 mg/hr Intravenous New Bag/Given 06/10/21 1903)  pantoprazole (PROTONIX) injection 40 mg (has no administration in time range)  sodium chloride 0.9 % bolus 1,000 mL (1,000 mLs Intravenous New Bag/Given 06/10/21 1754)  HYDROmorphone (DILAUDID) injection 1 mg (1 mg Intravenous Given 06/10/21 1754)  ondansetron (ZOFRAN) injection 4 mg (4 mg Intravenous Given 06/10/21 1753)  pantoprazole (PROTONIX) 80 mg /NS 100 mL IVPB (0 mg Intravenous Stopped 06/10/21 1905)  iohexol (OMNIPAQUE) 350 MG/ML injection 80 mL (80 mLs Intravenous Contrast Given 06/10/21 1806)    ED Course  I  have reviewed the triage vital signs and the nursing notes.  Pertinent  labs & imaging results that were available during my care of the patient were reviewed by me and considered in my medical decision making (see chart for details).    MDM Rules/Calculators/A&P                         CRITICAL CARE Performed by: Fredia Sorrow Total critical care time: 45 minutes Critical care time was exclusive of separately billable procedures and treating other patients. Critical care was necessary to treat or prevent imminent or life-threatening deterioration. Critical care was time spent personally by me on the following activities: development of treatment plan with patient and/or surrogate as well as nursing, discussions with consultants, evaluation of patient's response to treatment, examination of patient, obtaining history from patient or surrogate, ordering and performing treatments and interventions, ordering and review of laboratory studies, ordering and review of radiographic studies, pulse oximetry and re-evaluation of patient's condition.  CT scan concerning for gastric outlet obstruction.  A lot of thickening of the stomach.  Overall though patient does feel better with the Protonix and the Protonix drip and the antinausea medicine and fluids.  Patient has a marked leukocytosis.  Urinalysis also questionable for maybe urinary tract infection.  But not a lot of bacteria.  Will send urine culture.  Will discuss with general surgery regarding the CT scan findings.  Final Clinical Impression(s) / ED Diagnoses Final diagnoses:  Hiatal hernia  Gastric outlet obstruction    Rx / DC Orders ED Discharge Orders     None        Fredia Sorrow, MD 06/10/21 1936

## 2021-06-10 NOTE — Progress Notes (Signed)
Anesthesia Chart Review:   Case: [id number] Date/Time: 06/17/21 1215   Procedure: XI ROBOTIC ASSISTED HIATAL HERNIA REPAIR POSSIBLY WITH MESH   Anesthesia type: General   Pre-op diagnosis: massive hiatal hernia   [facility] / [facility]   Surgeons: Stechschulte, Nickola Major, MD       DISCUSSION: Pt is 70 years old with hx HTN, DM  VS: BP (!) 153/66   Pulse 78   Temp 36.7 C (Oral)   Resp 18   Ht _0  (1.676 m)   Wt 84.1 kg   SpO2 94%   BMI 29.92 kg/m   PROVIDERS: - PCP is Early, Coralee Pesa, NP - Saw cardiologist Rudean Haskell, MD 05/30/21 for evaluation of dyspnea, RBBB, HTN. Pt cleared for surgery at low risk. Echo ordered, has not been done yet, however, Dr. Oralia Rud note documents "no further cardiac testing is recommended prior to surgery"   LABS: Labs reviewed: Acceptable for surgery. - HbA1c was 9.9 on 05/06/21  (all labs ordered are listed, but only abnormal results are displayed)  Labs Reviewed  BASIC METABOLIC PANEL - Abnormal; Notable for the following components:      Result Value   Glucose, Bld 194 (*)    All other components within normal limits  GLUCOSE, CAPILLARY - Abnormal; Notable for the following components:   Glucose-Capillary 173 (*)    All other components within normal limits  CBC     EKG 05/30/21: SR rate RBBB QTc 502; JTC 430   CV: Nuclear stress test 10/04/20 [facility]): results have been requested. From note by Dr. Gasper Sells, pt reported test was normal.    Past Medical History:  Diagnosis Date   Acid reflux    Arthritis    Cholelithiasis with choledocholithiasis 02/2019   Diabetes ([place])    Headache    Hiatal hernia    Hyperlipidemia    Pneumonia    Polyp of stomach and duodenum     Past Surgical History:  Procedure Laterality Date   ABDOMINAL HYSTERECTOMY     partial   APPENDECTOMY     BIOPSY  03/08/2019   Procedure: BIOPSY;  Surgeon: Gatha Mayer, MD;  [facility];  Service:  Endoscopy;;   CHOLECYSTECTOMY N/A 03/09/2019   Procedure: laparoscopic cholecystectomy;  Surgeon: Jovita Kussmaul, MD;  [facility];  Service: General;  Laterality: N/A;   CYST REMOVAL TRUNK     ERCP N/A 03/08/2019   Procedure: ENDOSCOPIC RETROGRADE CHOLANGIOPANCREATOGRAPHY (ERCP);  Surgeon: Gatha Mayer, MD;  Location: [facility];  Service: Endoscopy;  Laterality: N/A;   ESOPHAGOGASTRODUODENOSCOPY (EGD) WITH PROPOFOL N/A 04/24/2019   Procedure: ESOPHAGOGASTRODUODENOSCOPY (EGD) WITH PROPOFOL;  Surgeon: Ronnette Juniper, MD;  [facility];  Service: Gastroenterology;  Laterality: N/A;   HEMOSTASIS CLIP PLACEMENT  04/24/2019   Procedure: HEMOSTASIS CLIP PLACEMENT;  Surgeon: Ronnette Juniper, MD;  [facility];  Service: Gastroenterology;;   PANCREATIC STENT PLACEMENT  03/08/2019   Procedure: PANCREATIC STENT PLACEMENT;  Surgeon: Gatha Mayer, MD;  [facility];  Service: Endoscopy;;   POLYPECTOMY  04/24/2019   Procedure: POLYPECTOMY;  Surgeon: Ronnette Juniper, MD;  [facility];  Service: Gastroenterology;;   REMOVAL OF STONES  03/08/2019   Procedure: REMOVAL OF STONES;  Surgeon: Gatha Mayer, MD;  [facility];  Service: Endoscopy;;   SPHINCTEROTOMY  03/08/2019   Procedure: Joan Mayans;  Surgeon: Gatha Mayer, MD;  [facility];  Service: Endoscopy;;    MEDICATIONS:  aspirin  81 MG chewable tablet   blood glucose meter kit and supplies   calcium carbonate (OSCAL) 1500 (600 Ca) MG TABS tablet   emollient lotion   glipiZIDE (GLUCOTROL) 5 MG tablet   lisinopril (ZESTRIL) 10 MG tablet   loratadine (CLARITIN) 10 MG tablet   meloxicam (MOBIC) 7.5 MG tablet   metFORMIN (GLUCOPHAGE) 500 MG tablet   Multiple Vitamin (MULTIVITAMIN WITH MINERALS) TABS tablet   Omega-3 Fatty Acids (FISH OIL PO)   ondansetron (ZOFRAN) 4 MG tablet   pantoprazole (PROTONIX) 40 MG tablet   PARoxetine (PAXIL) 30 MG tablet   rosuvastatin (CRESTOR) 10 MG tablet    rosuvastatin (CRESTOR) 5 MG tablet   No current facility-administered medications for this encounter.    If no changes, I anticipate pt can proceed with surgery as scheduled.   Willeen Cass, PhD, FNP-BC [facility] Stay [facility]/Anesthesiology Phone: [phone] 06/10/2021 4:02 PM

## 2021-06-10 NOTE — Progress Notes (Signed)
Virtual Visit Encounter telephone visit.   I connected with  Kendra Miles on 06/10/21 at  2:10 PM EDT by secure audio and/or video enabled telemedicine application. I verified that I am speaking with the correct person using two identifiers.   I introduced myself as a Designer, jewellery with the practice. The limitations of evaluation and management by telemedicine discussed with the patient and the availability of in person appointments. The patient expressed verbal understanding and consent to proceed.  Participating parties in this visit include: Myself and patient  The patient is: Patient Location: Home I am: Provider Location: Office/Clinic Subjective:    CC and HPI: Kendra Miles is a 70 y.o. year old female presenting for intractable vomiting since Tuesday of this week. She has a history of hiatal hernia and is scheduled to have hernia repair next week (10/28) with [facility].  She reports that she started vomiting on Tuesday and has been unable to hold down anything including liquids. Episodes of emesis are not uncommon for her with the hernia, but she reports this time her symptoms have lasted much longer than normal.  She has been taking small sips of water and ice chips but no other PO intake otherwise.  She endorses multiple episodes of emesis per day and until late last night they were black in color. No bright red blood, but this has occurred in the past. . She has not had a bowel movement since Tuesday when this started.  She denies fever or chills. She endorses being sore all over from prolonged vomiting, but otherwise no pain while lying still. She does have pain when moving and vomiting in her abdomen, but thinks that this may be due to muscle pain. She also tells me that she gets dizzy when she tries to get up. She has not lost consciousness.   Past medical history, Surgical history, Family history not pertinant except as noted below, Social history,  Allergies, and medications have been entered into the medical record, reviewed, and corrections made.   Review of Systems:  All review of systems negative except what is listed in the HPI  Objective:    Alert and oriented x 4 Speaking in clear sentences with no shortness of breath. Weak sounding.   Impression and Recommendations:    Problem List Items Addressed This Visit     Emesis, persistent - Primary    Given presentation, I feel that she needs to be evaluated in person in the emergency room.  Concerns present for dehydration, suspected GI bleeding (black emesis), and cause of prolonged emesis unknown. Unclear if hernia could be contributing to her symptoms.  No vitals available and inability to physically evaluate due to telephone visit severely limits the abilities of this visit.  Discussed the recommendation that she be seen in the emergency room immediately for further evaluation. She expresses understanding of instructions and informs me that her son is on his way to her at this time.   Offered to contact son to discuss recommendations, but she declines at this time. Offer to call EMS for transport but she declines at this time.  She is aware that she may call back with any changes or if she would like me to contact EMS to transport her or to speak with her son.        orders and follow up as documented in EMR I discussed the assessment and treatment plan with the patient. The patient was provided an opportunity to ask  questions and all were answered. The patient agreed with the plan and demonstrated an understanding of the instructions.   The patient was advised to call back or seek an in-person evaluation if the symptoms worsen or if the condition fails to improve as anticipated.  Follow-Up: Proceed to ED for evaluation- will follow  I provided 12 minutes of non-face-to-face interaction with this non face-to-face encounter including intake, same-day documentation, and  chart review.   Orma Render, NP , DNP, AGNP-c [place] [facility] [phone] (fax)

## 2021-06-10 NOTE — Assessment & Plan Note (Addendum)
Given presentation, I feel that she needs to be evaluated in person in the emergency room.  Concerns present for dehydration, suspected GI bleeding (black emesis), and cause of prolonged emesis unknown. Unclear if hernia could be contributing to her symptoms.  No vitals available and inability to physically evaluate due to telephone visit severely limits the abilities of this visit.  Discussed the recommendation that she be seen in the emergency room immediately for further evaluation. She expresses understanding of instructions and informs me that her son is on his way to her at this time.   Offered to contact son to discuss recommendations, but she declines at this time. Offer to call EMS for transport but she declines at this time.  She is aware that she may call back with any changes or if she would like me to contact EMS to transport her or to speak with her son.

## 2021-06-10 NOTE — ED Notes (Signed)
Patient stable and waiting for bed up stairs to be approved.

## 2021-06-10 NOTE — H&P (Signed)
Kendra Miles is an 70 y.o. female.   Chief Complaint: Abdominal pain, nausea vomiting HPI: Patient is a 70 year old  female who comes in secondary to female comes in here secondary to nausea vomiting abdominal pain.  Patient has a history of a large hiatal hernia.  Patient was seen by Dr. Thermon Leyland and was scheduled for surgery next Friday. Patient states that since Tuesday she had some nausea vomiting, decreased p.o. intake.  She states her last p.o. intake that she kept down was on Tuesday afternoon.  Patient had continued abdominal pain.  Patient was brought to the ER by her son for continued abdominal pain nausea vomiting.  Upon evaluation in the ER she underwent CT scan.  CT scan was significant for gastric outlet obstruction secondary to her hiatal hernia. Patient had a leukocytosis on laboratory work-up.  I did review the CT scan and laboratory studies personally.  Past Medical History:  Diagnosis Date   Acid reflux    Arthritis    Cholelithiasis with choledocholithiasis 02/2019   Diabetes ([place])    Headache    Hiatal hernia    Hyperlipidemia    Pneumonia    Polyp of stomach and duodenum     Past Surgical History:  Procedure Laterality Date   ABDOMINAL HYSTERECTOMY     partial   APPENDECTOMY     BIOPSY  03/08/2019   Procedure: BIOPSY;  Surgeon: Gatha Mayer, MD;  Location: [facility];  Service: Endoscopy;;   CHOLECYSTECTOMY N/A 03/09/2019   Procedure: laparoscopic cholecystectomy;  Surgeon: Jovita Kussmaul, MD;  Location: [facility];  Service: General;  Laterality: N/A;   CYST REMOVAL TRUNK     ERCP N/A 03/08/2019   Procedure: ENDOSCOPIC RETROGRADE CHOLANGIOPANCREATOGRAPHY (ERCP);  Surgeon: Gatha Mayer, MD;  Location: [facility];  Service: Endoscopy;  Laterality: N/A;   ESOPHAGOGASTRODUODENOSCOPY (EGD) WITH PROPOFOL N/A 04/24/2019   Procedure: ESOPHAGOGASTRODUODENOSCOPY (EGD) WITH PROPOFOL;  Surgeon: Ronnette Juniper, MD;  Location: [facility];  Service:  Gastroenterology;  Laterality: N/A;   HEMOSTASIS CLIP PLACEMENT  04/24/2019   Procedure: HEMOSTASIS CLIP PLACEMENT;  Surgeon: Ronnette Juniper, MD;  Location: [facility];  Service: Gastroenterology;;   PANCREATIC STENT PLACEMENT  03/08/2019   Procedure: PANCREATIC STENT PLACEMENT;  Surgeon: Gatha Mayer, MD;  Location: [facility];  Service: Endoscopy;;   POLYPECTOMY  04/24/2019   Procedure: POLYPECTOMY;  Surgeon: Ronnette Juniper, MD;  Location: [facility];  Service: Gastroenterology;;   REMOVAL OF STONES  03/08/2019   Procedure: REMOVAL OF STONES;  Surgeon: Gatha Mayer, MD;  Location: [facility];  Service: Endoscopy;;   SPHINCTEROTOMY  03/08/2019   Procedure: Joan Mayans;  Surgeon: Gatha Mayer, MD;  Location: [facility];  Service: Endoscopy;;    Family History  Problem Relation Age of Onset   Memory loss Mother    COPD Father    Social History:  reports that she has never smoked. She has never used smokeless tobacco. She reports that she does not drink alcohol and does not use drugs.  Allergies:  Allergies  Allergen Reactions   Ciprofloxacin Other (See Comments)    Stomach cramps    Guaifenesin Er Other (See Comments)    Mucinex - keeps pt awake all night    Penicillins Palpitations    Did it involve swelling of the face/tongue/throat, SOB, or low BP? No Did it involve sudden or severe rash/hives, skin peeling, or any reaction on the inside of your mouth or nose? No Did you need to seek medical attention  at a hospital or doctor's office? No When did it last happen? [phone]     If all above answers are "NO", may proceed with cephalosporin use.     (Not in a hospital admission)   Results for orders placed or performed during the hospital encounter of 06/10/21 (from the past 48 hour(s))  CBC with Differential     Status: Abnormal   Collection Time: 06/10/21  3:00 PM  Result Value Ref Range   WBC 23.2 (H) 4.0 - 10.5 K/uL   RBC 6.36 (H) 3.87 - 5.11 MIL/uL    Hemoglobin 18.8 (H) 12.0 - 15.0 g/dL   HCT 55.5 (H) 36.0 - 46.0 %   MCV 87.3 80.0 - 100.0 fL   MCH 29.6 26.0 - 34.0 pg   MCHC 33.9 30.0 - 36.0 g/dL   RDW 13.5 11.5 - 15.5 %   Platelets 408 (H) 150 - 400 K/uL   nRBC 0.0 0.0 - 0.2 %   Neutrophils Relative % 85 %   Neutro Abs 19.9 (H) 1.7 - 7.7 K/uL   Lymphocytes Relative 8 %   Lymphs Abs 1.8 0.7 - 4.0 K/uL   Monocytes Relative 6 %   Monocytes Absolute 1.4 (H) 0.1 - 1.0 K/uL   Eosinophils Relative 0 %   Eosinophils Absolute 0.0 0.0 - 0.5 K/uL   Basophils Relative 0 %   Basophils Absolute 0.0 0.0 - 0.1 K/uL   Immature Granulocytes 1 %   Abs Immature Granulocytes 0.14 (H) 0.00 - 0.07 K/uL    Comment: Performed at [facility], [place] [address], [place], [place] [address]  Comprehensive metabolic panel     Status: Abnormal   Collection Time: 06/10/21  3:00 PM  Result Value Ref Range   Sodium 138 135 - 145 mmol/L   Potassium 3.7 3.5 - 5.1 mmol/L   Chloride 95 (L) 98 - 111 mmol/L   CO2 24 22 - 32 mmol/L   Glucose, Bld 304 (H) 70 - 99 mg/dL    Comment: Glucose reference range applies only to samples taken after fasting for at least 8 hours.   BUN 39 (H) 8 - 23 mg/dL   Creatinine, Ser 0.88 0.44 - 1.00 mg/dL   Calcium 9.7 8.9 - 10.3 mg/dL   Total Protein 8.5 (H) 6.5 - 8.1 g/dL   Albumin 5.0 3.5 - 5.0 g/dL   AST 24 15 - 41 U/L   ALT 18 0 - 44 U/L   Alkaline Phosphatase 97 38 - 126 U/L   Total Bilirubin 1.6 (H) 0.3 - 1.2 mg/dL   GFR, Estimated >60 >60 mL/min    Comment: (NOTE) Calculated using the CKD-EPI Creatinine Equation (2021)    Anion gap 19 (H) 5 - 15    Comment: Performed at [facility], [place] [address] [place], [place] [address]  Lipase, blood     Status: None   Collection Time: 06/10/21  3:00 PM  Result Value Ref Range   Lipase 17 11 - 51 U/L    Comment: Performed at [facility], [place] [address] [place], [place] [address]  Urinalysis, Routine w reflex microscopic      Status: Abnormal   Collection Time: 06/10/21  6:20 PM  Result Value Ref Range   Color, Urine YELLOW YELLOW   APPearance HAZY (A) CLEAR   Specific Gravity, Urine 1.030 1.005 - 1.030   pH 5.0 5.0 - 8.0   Glucose, UA >=500 (A) NEGATIVE mg/dL   Hgb urine  dipstick SMALL (A) NEGATIVE   Bilirubin Urine NEGATIVE NEGATIVE   Ketones, ur 80 (A) NEGATIVE mg/dL   Protein, ur 100 (A) NEGATIVE mg/dL   Nitrite NEGATIVE NEGATIVE   Leukocytes,Ua LARGE (A) NEGATIVE   RBC / HPF 6-10 0 - 5 RBC/hpf   WBC, UA >50 (H) 0 - 5 WBC/hpf   Bacteria, UA RARE (A) NONE SEEN   Squamous Epithelial / LPF 0-5 0 - 5   Mucus PRESENT    Hyaline Casts, UA PRESENT     Comment: Performed at [facility], [place] [address] [place], [place] [address]   CT Abdomen Pelvis W Contrast  Result Date: 06/10/2021 CLINICAL DATA:  Nausea and vomiting EXAM: CT ABDOMEN AND PELVIS WITH CONTRAST TECHNIQUE: Multidetector CT imaging of the abdomen and pelvis was performed using the standard protocol following bolus administration of intravenous contrast. CONTRAST:  14mL OMNIPAQUE IOHEXOL 350 MG/ML SOLN COMPARISON:  CT 01/13/2020 FINDINGS: Lower chest: Lung bases demonstrate partial atelectasis at the left base. No pleural effusion. Large hiatal hernia. Hepatobiliary: No focal liver abnormality is seen. Status post cholecystectomy. No biliary dilatation. Pancreas: Unremarkable. No pancreatic ductal dilatation or surrounding inflammatory changes. Spleen: Stable calcified lesion in the spleen. Adrenals/Urinary Tract: Adrenal glands are normal. Kidneys show no hydronephrosis. Subcentimeter hypodensities too small to further characterize. Bladder unremarkable. Stomach/Bowel: Marked fluid distension of the stomach, edema thus appearing gastric outlet, findings suspicious for gastric outlet obstruction with gastric configuration suspect for volvulus. No dilated small bowel. No acute bowel wall thickening. Vascular/Lymphatic: Mild aortic  atherosclerosis. No aneurysm. No suspicious nodes. Reproductive: Status post hysterectomy. Asymmetrically enlarged right ovary with central low-density lesion measuring 28 x 19 mm, previously 31 x 15 mm. This likely represents benign cyst given lack of significant interval change. Other: Negative for pelvic effusion or free air. Musculoskeletal: No acute or significant osseous findings. IMPRESSION: 1. Large hiatal hernia. Marked fluid distension of the stomach with edematous appearing gastric outlet, concerning for gastric outlet obstruction. Configuration of the stomach is suspicious for gastric volvulus (? mesenteroaxial). The remainder of the small bowel and colon are otherwise unremarkable. 2. Other chronic findings as previously described. Electronically Signed   By: Donavan Foil M.D.   On: 06/10/2021 19:04   DG ABD ACUTE 2+V W 1V CHEST  Result Date: 06/10/2021 CLINICAL DATA:  Nausea and vomiting, history of hiatal hernia EXAM: DG ABDOMEN ACUTE WITH 1 VIEW CHEST COMPARISON:  04/21/2019, 01/13/2020 FINDINGS: Supine and upright frontal views of the abdomen and pelvis as well as an upright frontal view of the chest are obtained. Cardiac silhouette is unremarkable. Large hiatal hernia again identified unchanged since prior CT. No acute airspace disease, effusion, or pneumothorax. Bowel gas pattern is unremarkable without obstruction or ileus. There are no masses or abnormal calcifications. No free gas within the greater peritoneal sac. No acute or destructive bony lesions. IMPRESSION: 1. Large hiatal hernia unchanged. 2. No acute intrathoracic process. 3. Unremarkable bowel gas pattern. Electronically Signed   By: Randa Ngo M.D.   On: 06/10/2021 15:33    Review of Systems  Constitutional:  Negative for chills and fever.  HENT:  Negative for ear discharge, hearing loss and sore throat.   Eyes:  Negative for discharge.  Respiratory:  Negative for cough and shortness of breath.   Cardiovascular:   Negative for chest pain and leg swelling.  Gastrointestinal:  Positive for abdominal pain, nausea and vomiting. Negative for constipation and diarrhea.  Musculoskeletal:  Negative for myalgias and neck pain.  Skin:  Negative for rash.  Allergic/Immunologic: Negative for environmental allergies.  Neurological:  Negative for dizziness and seizures.  Hematological:  Does not bruise/bleed easily.  Psychiatric/Behavioral:  Negative for suicidal ideas.   All other systems reviewed and are negative.  Blood pressure (!) 143/76, pulse (!) 108, temperature 97.6 F (36.4 C), temperature source Oral, resp. rate 18, SpO2 96 %. Physical Exam Constitutional:      Appearance: She is well-developed.     Comments: Conversant No acute distress  HENT:     Head: Normocephalic and atraumatic.  Eyes:     General: Lids are normal. No scleral icterus.    Pupils: Pupils are equal, round, and reactive to light.     Comments: Pupils are equal round and reactive No lid lag Moist conjunctiva  Neck:     Thyroid: No thyromegaly.     Trachea: No tracheal tenderness.     Comments: No cervical lymphadenopathy Cardiovascular:     Rate and Rhythm: Normal rate and regular rhythm.     Heart sounds: No murmur heard. Pulmonary:     Effort: Pulmonary effort is normal.     Breath sounds: Normal breath sounds. No wheezing or rales.  Abdominal:     General: Distension: in epigastru,m.     Tenderness: There is abdominal tenderness.     Hernia: No hernia is present.  Musculoskeletal:     Cervical back: Normal range of motion and neck supple.  Skin:    General: Skin is warm.     Findings: No rash.     Nails: There is no clubbing.     Comments: Normal skin turgor  Neurological:     Mental Status: She is alert and oriented to person, place, and time.     Comments: Normal gait and station  Psychiatric:        Mood and Affect: Mood normal.        Thought Content: Thought content normal.        Judgment: Judgment  normal.     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 History of diabetes  1.  We will admit to the hospital, NG tube, IV fluid resuscitation. 2 .Will reach out to Dr. Thermon Leyland in regards to possibly moving up her surgery.  Ralene Ok, MD 06/10/2021, 8:12 PM

## 2021-06-10 NOTE — Patient Instructions (Signed)
Due to the seriousness associated with dehydration and black vomit, I feel that you should be seen in person in the emergency room for evaluation immediately. I am concerned that the hernia could be causing some of the issues and you likely need fluids and imaging to help determine what next steps are needed.   If you would like for [place] to call for EMS transport, please call us back and let us know.  If you would like for me to speak with your son, please have him call or call the office.

## 2021-06-10 NOTE — ED Triage Notes (Signed)
Pt arrived via POV, c/o vomiting since Tuesday. States hx of hiatal hernia, states she believes that is the cause. Scheduled for surgery 10/28. States emesis has been black.

## 2021-06-10 NOTE — ED Provider Notes (Signed)
Emergency Medicine Provider Triage Evaluation Note  NATALIEE SHURTZ , a 70 y.o. female  was evaluated in triage.  Pt complains of abd pain.  Review of Systems  Positive: Epigastric pain, nausea, vomiting, lack of BM Negative: Fever, sob, dysuria  Physical Exam  BP (!) 165/104 (BP Location: Left Arm)   Pulse (!) 122   Temp 97.6 F (36.4 C) (Oral)   Resp 16   SpO2 93%  Gen:   Awake, actively vomiting Resp:  Normal effort  MSK:   Moves extremities without difficulty  Other:    Medical Decision Making  Medically screening exam initiated at 2:46 PM.  Appropriate orders placed.  AAYAT HAJJAR was informed that the remainder of the evaluation will be completed by another provider, this initial triage assessment does not replace that evaluation, and the importance of remaining in the ED until their evaluation is complete.  Pt here with epigastric pain with associated nausea and vomiting x 4 days.  Has hiatal hernia.  Prior cholecystectomy.  No prior hx of SBO.  No BM x 4 days.  Has tried home Zofran without relief.    Domenic Moras, PA-C 06/10/21 1501    Milton Ferguson, MD 06/11/21 1007

## 2021-06-11 ENCOUNTER — Other Ambulatory Visit: Payer: Self-pay

## 2021-06-11 LAB — BASIC METABOLIC PANEL
Anion gap: 10 (ref 5–15)
BUN: 28 mg/dL — ABNORMAL HIGH (ref 8–23)
CO2: 26 mmol/L (ref 22–32)
Calcium: 8.2 mg/dL — ABNORMAL LOW (ref 8.9–10.3)
Chloride: 102 mmol/L (ref 98–111)
Creatinine, Ser: 0.81 mg/dL (ref 0.44–1.00)
GFR, Estimated: >60 mL/min (ref >60)
Glucose, Bld: 267 mg/dL — ABNORMAL HIGH (ref 70–99)
Potassium: 3.8 mmol/L (ref 3.5–5.1)
Sodium: 138 mmol/L (ref 135–145)

## 2021-06-11 LAB — CBC
HCT: 53.8 % — ABNORMAL HIGH (ref 36.0–46.0)
Hemoglobin: 17.1 g/dL — ABNORMAL HIGH (ref 12.0–15.0)
MCH: 29.4 pg (ref 26.0–34.0)
MCHC: 31.8 g/dL (ref 30.0–36.0)
MCV: 92.4 fL (ref 80.0–100.0)
Platelets: 338 10*3/uL (ref 150–400)
RBC: 5.82 MIL/uL — ABNORMAL HIGH (ref 3.87–5.11)
RDW: 13.7 % (ref 11.5–15.5)
WBC: 19.9 10*3/uL — ABNORMAL HIGH (ref 4.0–10.5)
nRBC: 0 % (ref 0.0–0.2)

## 2021-06-11 LAB — GLUCOSE, CAPILLARY
Glucose-Capillary: 226 mg/dL — ABNORMAL HIGH (ref 70–99)
Glucose-Capillary: 223 mg/dL — ABNORMAL HIGH (ref 70–99)
Glucose-Capillary: 184 mg/dL — ABNORMAL HIGH (ref 70–99)
Glucose-Capillary: 165 mg/dL — ABNORMAL HIGH (ref 70–99)

## 2021-06-11 MED ORDER — LIDOCAINE HCL URETHRAL/MUCOSAL 2 % EX GEL
1.0000 "application " | Freq: Once | CUTANEOUS | Status: AC
  Administered 2021-06-11: 1
  Filled 2021-06-11: qty 5

## 2021-06-11 NOTE — Progress Notes (Signed)
Subjective/Chief Complaint: Pt with no emesis overnight NGT placement unsuccessful   Objective: Vital signs in last 24 hours: Temp:  [97.6 F (36.4 C)-98.1 F (36.7 C)] 97.9 F (36.6 C) (10/22 0509) Pulse Rate:  [95-122] 95 (10/22 0509) Resp:  [16-18] 16 (10/22 0509) BP: (122-169)/(73-104) 150/96 (10/22 0509) SpO2:  [87 %-96 %] 94 % (10/22 0509) Weight:  [80 kg-86.2 kg] 80 kg (10/22 0016) Last BM Date: 06/07/21  Intake/Output from previous day: No intake/output data recorded. Intake/Output this shift: No intake/output data recorded.  PE:  Constitutional: No acute distress, conversant, appears states age. Eyes: Anicteric sclerae, moist conjunctiva, no lid lag Lungs: Clear to auscultation bilaterally, normal respiratory effort CV: regular rate and rhythm, no murmurs, no peripheral edema, pedal pulses 2+ GI: Soft, no masses or hepatosplenomegaly, tender to palpation in epigastrum Skin: No rashes, palpation reveals normal turgor Psychiatric: appropriate judgment and insight, oriented to person, place, and time   Lab Results:  Recent Labs    06/10/21 1500 06/11/21 0507  WBC 23.2* 19.9*  HGB 18.8* 17.1*  HCT 55.5* 53.8*  PLT 408* 338   BMET Recent Labs    06/10/21 1500 06/11/21 0507  NA 138 138  K 3.7 3.8  CL 95* 102  CO2 24 26  GLUCOSE 304* 267*  BUN 39* 28*  CREATININE 0.88 0.81  CALCIUM 9.7 8.2*   PT/INR No results for input(s): LABPROT, INR in the last 72 hours. ABG No results for input(s): PHART, HCO3 in the last 72 hours.  Invalid input(s): PCO2, PO2  Studies/Results: CT Abdomen Pelvis W Contrast  Result Date: 06/10/2021 CLINICAL DATA:  Nausea and vomiting EXAM: CT ABDOMEN AND PELVIS WITH CONTRAST TECHNIQUE: Multidetector CT imaging of the abdomen and pelvis was performed using the standard protocol following bolus administration of intravenous contrast. CONTRAST:  42mL OMNIPAQUE IOHEXOL 350 MG/ML SOLN COMPARISON:  CT 01/13/2020 FINDINGS:  Lower chest: Lung bases demonstrate partial atelectasis at the left base. No pleural effusion. Large hiatal hernia. Hepatobiliary: No focal liver abnormality is seen. Status post cholecystectomy. No biliary dilatation. Pancreas: Unremarkable. No pancreatic ductal dilatation or surrounding inflammatory changes. Spleen: Stable calcified lesion in the spleen. Adrenals/Urinary Tract: Adrenal glands are normal. Kidneys show no hydronephrosis. Subcentimeter hypodensities too small to further characterize. Bladder unremarkable. Stomach/Bowel: Marked fluid distension of the stomach, edema thus appearing gastric outlet, findings suspicious for gastric outlet obstruction with gastric configuration suspect for volvulus. No dilated small bowel. No acute bowel wall thickening. Vascular/Lymphatic: Mild aortic atherosclerosis. No aneurysm. No suspicious nodes. Reproductive: Status post hysterectomy. Asymmetrically enlarged right ovary with central low-density lesion measuring 28 x 19 mm, previously 31 x 15 mm. This likely represents benign cyst given lack of significant interval change. Other: Negative for pelvic effusion or free air. Musculoskeletal: No acute or significant osseous findings. IMPRESSION: 1. Large hiatal hernia. Marked fluid distension of the stomach with edematous appearing gastric outlet, concerning for gastric outlet obstruction. Configuration of the stomach is suspicious for gastric volvulus (? mesenteroaxial). The remainder of the small bowel and colon are otherwise unremarkable. 2. Other chronic findings as previously described. Electronically Signed   By: Donavan Foil M.D.   On: 06/10/2021 19:04   DG ABD ACUTE 2+V W 1V CHEST  Result Date: 06/10/2021 CLINICAL DATA:  Nausea and vomiting, history of hiatal hernia EXAM: DG ABDOMEN ACUTE WITH 1 VIEW CHEST COMPARISON:  04/21/2019, 01/13/2020 FINDINGS: Supine and upright frontal views of the abdomen and pelvis as well as an upright frontal view of the chest  are obtained. Cardiac silhouette is unremarkable. Large hiatal hernia again identified unchanged since prior CT. No acute airspace disease, effusion, or pneumothorax. Bowel gas pattern is unremarkable without obstruction or ileus. There are no masses or abnormal calcifications. No free gas within the greater peritoneal sac. No acute or destructive bony lesions. IMPRESSION: 1. Large hiatal hernia unchanged. 2. No acute intrathoracic process. 3. Unremarkable bowel gas pattern. Electronically Signed   By: Randa Ngo M.D.   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 History of diabetes  Will order lidocaine jelly to see if pt can tol NGT placement I d/w pt and her son that NGT is essential to helping decreased chance of aspiration and help with decompression of stomach.    LOS: 1 day    Ralene Ok 06/11/2021

## 2021-06-11 NOTE — Plan of Care (Signed)

## 2021-06-12 LAB — GLUCOSE, CAPILLARY
Glucose-Capillary: 195 mg/dL — ABNORMAL HIGH (ref 70–99)
Glucose-Capillary: 168 mg/dL — ABNORMAL HIGH (ref 70–99)
Glucose-Capillary: 186 mg/dL — ABNORMAL HIGH (ref 70–99)
Glucose-Capillary: 169 mg/dL — ABNORMAL HIGH (ref 70–99)

## 2021-06-12 LAB — URINE CULTURE

## 2021-06-12 MED ORDER — SODIUM CHLORIDE 0.9 % IV SOLN
12.5000 mg | Freq: Once | INTRAVENOUS | Status: AC
  Administered 2021-06-12: 12.5 mg via INTRAVENOUS
  Filled 2021-06-12: qty 12.5

## 2021-06-12 NOTE — Progress Notes (Signed)
MD paged regarding nausea, vomiting and not tolerating NGT placement. New order for phenergan and strict NPO.

## 2021-06-12 NOTE — Progress Notes (Signed)
Attempted to insert NGT to R Nare x 2 and L Nare X1 with assist of two staff peers. Pt pushed [place] away each attempt crying "it hurts".  Hildred [address] reported to plans to contact MD.

## 2021-06-12 NOTE — Progress Notes (Signed)
Subjective/Chief Complaint: No acute changes overnight Could not tol NGT placement No n/v Cont' with epigastric abd pain   Objective: Vital signs in last 24 hours: Temp:  [98 F (36.7 C)-98.8 F (37.1 C)] 98.8 F (37.1 C) (10/23 0553) Pulse Rate:  [93-99] 99 (10/23 0553) Resp:  [18] 18 (10/23 0553) BP: (122-161)/(70-101) 133/79 (10/23 0553) SpO2:  [59 %-96 %] 96 % (10/23 0553) Last BM Date: 06/07/21  Intake/Output from previous day: 10/22 0701 - 10/23 0700 In: 3471.8 [I.V.:3471.8] Out: 1650 [Urine:1650] Intake/Output this shift: No intake/output data recorded.  PE:  Constitutional: No acute distress, conversant, appears states age. Eyes: Anicteric sclerae, moist conjunctiva, no lid lag Lungs: Clear to auscultation bilaterally, normal respiratory effort CV: regular rate and rhythm, no murmurs, no peripheral edema, pedal pulses 2+ GI: Soft, no masses or hepatosplenomegaly, min-tender to palpation in epigastrum Skin: No rashes, palpation reveals normal turgor Psychiatric: appropriate judgment and insight, oriented to person, place, and time   Lab Results:  Recent Labs    06/10/21 1500 06/11/21 0507  WBC 23.2* 19.9*  HGB 18.8* 17.1*  HCT 55.5* 53.8*  PLT 408* 338   BMET Recent Labs    06/10/21 1500 06/11/21 0507  NA 138 138  K 3.7 3.8  CL 95* 102  CO2 24 26  GLUCOSE 304* 267*  BUN 39* 28*  CREATININE 0.88 0.81  CALCIUM 9.7 8.2*   PT/INR No results for input(s): LABPROT, INR in the last 72 hours. ABG No results for input(s): PHART, HCO3 in the last 72 hours.  Invalid input(s): PCO2, PO2  Studies/Results: CT Abdomen Pelvis W Contrast  Result Date: 06/10/2021 CLINICAL DATA:  Nausea and vomiting EXAM: CT ABDOMEN AND PELVIS WITH CONTRAST TECHNIQUE: Multidetector CT imaging of the abdomen and pelvis was performed using the standard protocol following bolus administration of intravenous contrast. CONTRAST:  78mL OMNIPAQUE IOHEXOL 350 MG/ML SOLN  COMPARISON:  CT 01/13/2020 FINDINGS: Lower chest: Lung bases demonstrate partial atelectasis at the left base. No pleural effusion. Large hiatal hernia. Hepatobiliary: No focal liver abnormality is seen. Status post cholecystectomy. No biliary dilatation. Pancreas: Unremarkable. No pancreatic ductal dilatation or surrounding inflammatory changes. Spleen: Stable calcified lesion in the spleen. Adrenals/Urinary Tract: Adrenal glands are normal. Kidneys show no hydronephrosis. Subcentimeter hypodensities too small to further characterize. Bladder unremarkable. Stomach/Bowel: Marked fluid distension of the stomach, edema thus appearing gastric outlet, findings suspicious for gastric outlet obstruction with gastric configuration suspect for volvulus. No dilated small bowel. No acute bowel wall thickening. Vascular/Lymphatic: Mild aortic atherosclerosis. No aneurysm. No suspicious nodes. Reproductive: Status post hysterectomy. Asymmetrically enlarged right ovary with central low-density lesion measuring 28 x 19 mm, previously 31 x 15 mm. This likely represents benign cyst given lack of significant interval change. Other: Negative for pelvic effusion or free air. Musculoskeletal: No acute or significant osseous findings. IMPRESSION: 1. Large hiatal hernia. Marked fluid distension of the stomach with edematous appearing gastric outlet, concerning for gastric outlet obstruction. Configuration of the stomach is suspicious for gastric volvulus (? mesenteroaxial). The remainder of the small bowel and colon are otherwise unremarkable. 2. Other chronic findings as previously described. Electronically Signed   By: Donavan Foil M.D.   On: 06/10/2021 19:04   DG ABD ACUTE 2+V W 1V CHEST  Result Date: 06/10/2021 CLINICAL DATA:  Nausea and vomiting, history of hiatal hernia EXAM: DG ABDOMEN ACUTE WITH 1 VIEW CHEST COMPARISON:  04/21/2019, 01/13/2020 FINDINGS: Supine and upright frontal views of the abdomen and pelvis as well as  an upright frontal view of the chest are obtained. Cardiac silhouette is unremarkable. Large hiatal hernia again identified unchanged since prior CT. No acute airspace disease, effusion, or pneumothorax. Bowel gas pattern is unremarkable without obstruction or ileus. There are no masses or abnormal calcifications. No free gas within the greater peritoneal sac. No acute or destructive bony lesions. IMPRESSION: 1. Large hiatal hernia unchanged. 2. No acute intrathoracic process. 3. Unremarkable bowel gas pattern. Electronically Signed   By: Randa Ngo M.D.   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 History of diabetes   Con't IVF hydration, NPO D/w Dr. Lanny Hurst for hopeful OR early this week.  LOS: 2 days    Kendra Miles 06/12/2021

## 2021-06-12 NOTE — Progress Notes (Signed)
Dr. Rosendo Gros aware pt vomiting. Ask for another antiemetic. Ordered an NGT.

## 2021-06-13 ENCOUNTER — Encounter (HOSPITAL_COMMUNITY)
Admission: EM | Disposition: A | Discharge status: Home / Self Care | Payer: Self-pay | Source: Home / Self Care | Attending: Surgery

## 2021-06-13 ENCOUNTER — Inpatient Hospital Stay (HOSPITAL_COMMUNITY): Payer: Medicare Other | Admitting: Certified Registered"

## 2021-06-13 ENCOUNTER — Encounter (HOSPITAL_COMMUNITY): Payer: Self-pay | Admitting: Surgery

## 2021-06-13 HISTORY — PX: LAPAROSCOPIC NISSEN FUNDOPLICATION: SHX1932

## 2021-06-13 HISTORY — PX: UPPER GI ENDOSCOPY: SHX6162

## 2021-06-13 LAB — GLUCOSE, CAPILLARY
Glucose-Capillary: 233 mg/dL — ABNORMAL HIGH (ref 70–99)
Glucose-Capillary: 175 mg/dL — ABNORMAL HIGH (ref 70–99)
Glucose-Capillary: 193 mg/dL — ABNORMAL HIGH (ref 70–99)
Glucose-Capillary: 229 mg/dL — ABNORMAL HIGH (ref 70–99)

## 2021-06-13 SURGERY — REPAIR, HERNIA, HIATAL, LAPAROSCOPIC
Anesthesia: General

## 2021-06-13 SURGERY — FUNDOPLICATION, NISSEN, LAPAROSCOPIC
Anesthesia: General

## 2021-06-13 SURGERY — FUNDOPLICATION, NISSEN, LAPAROSCOPIC
Anesthesia: General | Site: Mouth

## 2021-06-13 SURGERY — ESOPHAGOGASTRODUODENOSCOPY (EGD) WITH PROPOFOL
Anesthesia: Monitor Anesthesia Care

## 2021-06-13 MED ORDER — AMISULPRIDE (ANTIEMETIC) 5 MG/2ML IV SOLN: 10.0000 mg | Freq: Once | INTRAVENOUS | Status: DC | PRN

## 2021-06-13 MED ORDER — ONDANSETRON HCL 4 MG/2ML IJ SOLN: 4.0000 mg | Freq: Once | INTRAMUSCULAR | Status: DC | PRN

## 2021-06-13 MED ORDER — PAROXETINE HCL 20 MG PO TABS
30.0000 mg | ORAL_TABLET | Freq: Every day | ORAL | Status: DC
  Administered 2021-06-14: 30 mg via ORAL
  Filled 2021-06-13 (×2): qty 1

## 2021-06-13 MED ORDER — ROCURONIUM BROMIDE 10 MG/ML (PF) SYRINGE
PREFILLED_SYRINGE | INTRAVENOUS | Status: AC
  Filled 2021-06-13: qty 10

## 2021-06-13 MED ORDER — OXYCODONE HCL 5 MG PO TABS: 5.0000 mg | ORAL_TABLET | Freq: Once | ORAL | Status: DC | PRN

## 2021-06-13 MED ORDER — PHENYLEPHRINE 40 MCG/ML (10ML) SYRINGE FOR IV PUSH (FOR BLOOD PRESSURE SUPPORT)
PREFILLED_SYRINGE | INTRAVENOUS | Status: DC | PRN
  Administered 2021-06-13 (×2): 60 ug via INTRAVENOUS

## 2021-06-13 MED ORDER — ONDANSETRON HCL 4 MG/2ML IJ SOLN
INTRAMUSCULAR | Status: DC | PRN
  Administered 2021-06-13: 4 mg via INTRAVENOUS

## 2021-06-13 MED ORDER — LACTATED RINGERS IR SOLN
Status: DC | PRN
  Administered 2021-06-13: 1000 mL

## 2021-06-13 MED ORDER — ONDANSETRON HCL 4 MG/2ML IJ SOLN
INTRAMUSCULAR | Status: AC
  Filled 2021-06-13: qty 2

## 2021-06-13 MED ORDER — ACETAMINOPHEN 325 MG PO TABS
650.0000 mg | ORAL_TABLET | Freq: Four times a day (QID) | ORAL | Status: DC
  Administered 2021-06-13 – 2021-06-15 (×7): 650 mg via ORAL
  Filled 2021-06-13 (×5): qty 2

## 2021-06-13 MED ORDER — BUPIVACAINE-EPINEPHRINE 0.25% -1:200000 IJ SOLN
INTRAMUSCULAR | Status: AC
  Filled 2021-06-13: qty 1

## 2021-06-13 MED ORDER — KETAMINE HCL 10 MG/ML IJ SOLN
INTRAMUSCULAR | Status: AC
  Filled 2021-06-13: qty 1

## 2021-06-13 MED ORDER — FENTANYL CITRATE (PF) 100 MCG/2ML IJ SOLN
INTRAMUSCULAR | Status: AC
  Filled 2021-06-13: qty 2

## 2021-06-13 MED ORDER — 0.9 % SODIUM CHLORIDE (POUR BTL) OPTIME
TOPICAL | Status: DC | PRN
  Administered 2021-06-13: 1000 mL

## 2021-06-13 MED ORDER — ACETAMINOPHEN 500 MG PO TABS: 1000.0000 mg | ORAL_TABLET | ORAL | Status: DC

## 2021-06-13 MED ORDER — ASPIRIN 81 MG PO CHEW
81.0000 mg | CHEWABLE_TABLET | Freq: Every day | ORAL | Status: DC
  Administered 2021-06-14: 81 mg via ORAL
  Filled 2021-06-13: qty 1

## 2021-06-13 MED ORDER — BUPIVACAINE-EPINEPHRINE 0.25% -1:200000 IJ SOLN
INTRAMUSCULAR | Status: DC | PRN
  Administered 2021-06-13: 50 mL

## 2021-06-13 MED ORDER — HYDROMORPHONE HCL 1 MG/ML IJ SOLN
INTRAMUSCULAR | Status: AC
  Administered 2021-06-13: 0.25 mg via INTRAVENOUS
  Filled 2021-06-13: qty 1

## 2021-06-13 MED ORDER — EPHEDRINE 5 MG/ML INJ
INTRAVENOUS | Status: AC
  Filled 2021-06-13: qty 5

## 2021-06-13 MED ORDER — CHLORHEXIDINE GLUCONATE CLOTH 2 % EX PADS: 6.0000 | MEDICATED_PAD | Freq: Once | CUTANEOUS | Status: DC

## 2021-06-13 MED ORDER — DOCUSATE SODIUM 100 MG PO CAPS
100.0000 mg | ORAL_CAPSULE | Freq: Two times a day (BID) | ORAL | Status: DC
  Administered 2021-06-13 – 2021-06-14 (×3): 100 mg via ORAL
  Filled 2021-06-13 (×3): qty 1

## 2021-06-13 MED ORDER — CELECOXIB 200 MG PO CAPS: 400.0000 mg | ORAL_CAPSULE | ORAL | Status: DC

## 2021-06-13 MED ORDER — PROPOFOL 10 MG/ML IV BOLUS
INTRAVENOUS | Status: AC
  Filled 2021-06-13: qty 20

## 2021-06-13 MED ORDER — OXYCODONE HCL 5 MG PO TABS
5.0000 mg | ORAL_TABLET | ORAL | Status: DC | PRN
Start: 2021-06-13 — End: 2021-06-15
  Filled 2021-06-13: qty 1

## 2021-06-13 MED ORDER — HYDROMORPHONE HCL 1 MG/ML IJ SOLN
0.2500 mg | INTRAMUSCULAR | Status: DC | PRN
  Administered 2021-06-13 (×2): 0.25 mg via INTRAVENOUS

## 2021-06-13 MED ORDER — ALBUMIN HUMAN 5 % IV SOLN: INTRAVENOUS | Status: DC | PRN

## 2021-06-13 MED ORDER — GABAPENTIN 300 MG PO CAPS: 300.0000 mg | ORAL_CAPSULE | ORAL | Status: DC

## 2021-06-13 MED ORDER — MIDAZOLAM HCL 2 MG/2ML IJ SOLN
INTRAMUSCULAR | Status: DC | PRN
  Administered 2021-06-13 (×2): 1 mg via INTRAVENOUS

## 2021-06-13 MED ORDER — HYDROMORPHONE HCL 1 MG/ML IJ SOLN: 0.5000 mg | INTRAMUSCULAR | Status: DC | PRN

## 2021-06-13 MED ORDER — KETAMINE HCL 10 MG/ML IJ SOLN
INTRAMUSCULAR | Status: DC | PRN
  Administered 2021-06-13 (×3): 10 mg via INTRAVENOUS

## 2021-06-13 MED ORDER — SUGAMMADEX SODIUM 200 MG/2ML IV SOLN
INTRAVENOUS | Status: DC | PRN
  Administered 2021-06-13: 200 mg via INTRAVENOUS

## 2021-06-13 MED ORDER — ROSUVASTATIN CALCIUM 5 MG PO TABS
5.0000 mg | ORAL_TABLET | Freq: Every day | ORAL | Status: DC
  Administered 2021-06-14: 5 mg via ORAL
  Filled 2021-06-13: qty 1

## 2021-06-13 MED ORDER — CLINDAMYCIN PHOSPHATE 900 MG/50ML IV SOLN
900.0000 mg | INTRAVENOUS | Status: AC
  Administered 2021-06-13: 900 mg via INTRAVENOUS
  Filled 2021-06-13: qty 50

## 2021-06-13 MED ORDER — MIDAZOLAM HCL 2 MG/2ML IJ SOLN
INTRAMUSCULAR | Status: AC
  Filled 2021-06-13: qty 2

## 2021-06-13 MED ORDER — DEXAMETHASONE SODIUM PHOSPHATE 10 MG/ML IJ SOLN
INTRAMUSCULAR | Status: AC
  Filled 2021-06-13: qty 1

## 2021-06-13 MED ORDER — DEXAMETHASONE SODIUM PHOSPHATE 10 MG/ML IJ SOLN
INTRAMUSCULAR | Status: DC | PRN
  Administered 2021-06-13: 4 mg via INTRAVENOUS

## 2021-06-13 MED ORDER — PHENYLEPHRINE HCL (PRESSORS) 10 MG/ML IV SOLN
INTRAVENOUS | Status: AC
  Filled 2021-06-13: qty 2

## 2021-06-13 MED ORDER — EPHEDRINE SULFATE-NACL 50-0.9 MG/10ML-% IV SOSY
PREFILLED_SYRINGE | INTRAVENOUS | Status: DC | PRN
  Administered 2021-06-13: 10 mg via INTRAVENOUS

## 2021-06-13 MED ORDER — METOCLOPRAMIDE HCL 5 MG/ML IJ SOLN
10.0000 mg | Freq: Four times a day (QID) | INTRAMUSCULAR | Status: DC
Start: 2021-06-13 — End: 2021-06-15
  Administered 2021-06-13 – 2021-06-15 (×7): 10 mg via INTRAVENOUS
  Filled 2021-06-13 (×7): qty 2

## 2021-06-13 MED ORDER — METHOCARBAMOL 1000 MG/10ML IJ SOLN
500.0000 mg | Freq: Four times a day (QID) | INTRAVENOUS | Status: DC | PRN
  Filled 2021-06-13: qty 5

## 2021-06-13 MED ORDER — PROPOFOL 10 MG/ML IV BOLUS
INTRAVENOUS | Status: DC | PRN
  Administered 2021-06-13: 160 mg via INTRAVENOUS

## 2021-06-13 MED ORDER — SUCCINYLCHOLINE CHLORIDE 200 MG/10ML IV SOSY
PREFILLED_SYRINGE | INTRAVENOUS | Status: DC | PRN
  Administered 2021-06-13: 120 mg via INTRAVENOUS

## 2021-06-13 MED ORDER — PROCHLORPERAZINE EDISYLATE 10 MG/2ML IJ SOLN: 10.0000 mg | INTRAMUSCULAR | Status: DC | PRN

## 2021-06-13 MED ORDER — CHLORHEXIDINE GLUCONATE CLOTH 2 % EX PADS
6.0000 | MEDICATED_PAD | Freq: Every day | CUTANEOUS | Status: DC
  Administered 2021-06-14: 6 via TOPICAL

## 2021-06-13 MED ORDER — ROCURONIUM BROMIDE 10 MG/ML (PF) SYRINGE
PREFILLED_SYRINGE | INTRAVENOUS | Status: DC | PRN
  Administered 2021-06-13: 10 mg via INTRAVENOUS
  Administered 2021-06-13: 40 mg via INTRAVENOUS

## 2021-06-13 MED ORDER — FENTANYL CITRATE (PF) 250 MCG/5ML IJ SOLN
INTRAMUSCULAR | Status: DC | PRN
  Administered 2021-06-13: 25 ug via INTRAVENOUS
  Administered 2021-06-13: 50 ug via INTRAVENOUS
  Administered 2021-06-13: 100 ug via INTRAVENOUS
  Administered 2021-06-13: 25 ug via INTRAVENOUS

## 2021-06-13 MED ORDER — CHLORHEXIDINE GLUCONATE 0.12 % MT SOLN
15.0000 mL | Freq: Once | OROMUCOSAL | Status: AC
  Administered 2021-06-13: 15 mL via OROMUCOSAL

## 2021-06-13 MED ORDER — ALBUMIN HUMAN 5 % IV SOLN
INTRAVENOUS | Status: AC
  Filled 2021-06-13: qty 250

## 2021-06-13 MED ORDER — BUPIVACAINE LIPOSOME 1.3 % IJ SUSP: 20.0000 mL | Freq: Once | INTRAMUSCULAR | Status: DC

## 2021-06-13 MED ORDER — OXYCODONE HCL 5 MG PO TABS: 10.0000 mg | ORAL_TABLET | ORAL | Status: DC | PRN

## 2021-06-13 MED ORDER — LACTATED RINGERS IV SOLN: INTRAVENOUS | Status: DC

## 2021-06-13 MED ORDER — PHENYLEPHRINE 40 MCG/ML (10ML) SYRINGE FOR IV PUSH (FOR BLOOD PRESSURE SUPPORT)
PREFILLED_SYRINGE | INTRAVENOUS | Status: AC
  Filled 2021-06-13: qty 10

## 2021-06-13 MED ORDER — PHENYLEPHRINE HCL-NACL 20-0.9 MG/250ML-% IV SOLN
INTRAVENOUS | Status: DC | PRN
  Administered 2021-06-13: 20 ug/min via INTRAVENOUS

## 2021-06-13 MED ORDER — OXYCODONE HCL 5 MG/5ML PO SOLN
5.0000 mg | Freq: Once | ORAL | Status: DC | PRN
Start: 2021-06-13 — End: 2021-06-13

## 2021-06-13 SURGICAL SUPPLY — 61 items
ADH SKN CLS APL DERMABOND .7 (GAUZE/BANDAGES/DRESSINGS) ×2
APL PRP STRL LF DISP 70% ISPRP (MISCELLANEOUS) ×2
APL SKNCLS STERI-STRIP NONHPOA (GAUZE/BANDAGES/DRESSINGS)
APPLIER CLIP 5 13 M/L LIGAMAX5 (MISCELLANEOUS)
APPLIER CLIP ROT 10 11.4 M/L (STAPLE)
APR CLP MED LRG 11.4X10 (STAPLE)
APR CLP MED LRG 5 ANG JAW (MISCELLANEOUS)
BAG COUNTER SPONGE SURGICOUNT (BAG) ×1 IMPLANT
BAG SPNG CNTER NS LX DISP (BAG) ×2
BENZOIN TINCTURE PRP APPL 2/3 (GAUZE/BANDAGES/DRESSINGS) ×2 IMPLANT
BNDG ADH 1X3 SHEER STRL LF (GAUZE/BANDAGES/DRESSINGS) ×12 IMPLANT
BNDG ADH THN 3X1 STRL LF (GAUZE/BANDAGES/DRESSINGS)
CHLORAPREP W/TINT 26 (MISCELLANEOUS) ×3 IMPLANT
CLIP APPLIE 5 13 M/L LIGAMAX5 (MISCELLANEOUS) IMPLANT
CLIP APPLIE ROT 10 11.4 M/L (STAPLE) IMPLANT
COVER SURGICAL LIGHT HANDLE (MISCELLANEOUS) ×3 IMPLANT
DECANTER SPIKE VIAL GLASS SM (MISCELLANEOUS) ×3 IMPLANT
DERMABOND ADVANCED (GAUZE/BANDAGES/DRESSINGS) ×1
DERMABOND ADVANCED .7 DNX12 (GAUZE/BANDAGES/DRESSINGS) IMPLANT
DRAIN CHANNEL 19F RND (DRAIN) IMPLANT
DRAIN PENROSE 0.5X18 (DRAIN) ×3 IMPLANT
ELECT L-HOOK LAP 45CM DISP (ELECTROSURGICAL)
ELECT REM PT RETURN 15FT ADLT (MISCELLANEOUS) ×3 IMPLANT
ELECTRODE L-HOOK LAP 45CM DISP (ELECTROSURGICAL) IMPLANT
EVACUATOR SILICONE 100CC (DRAIN) IMPLANT
GLOVE SURG POLYISO LF SZ7 (GLOVE) ×2 IMPLANT
GLOVE SURG UNDER POLY LF SZ7 (GLOVE) ×2 IMPLANT
GOWN STRL REUS W/TWL XL LVL3 (GOWN DISPOSABLE) ×9 IMPLANT
IRRIG SUCT STRYKERFLOW 2 WTIP (MISCELLANEOUS) ×3
IRRIGATION SUCT STRKRFLW 2 WTP (MISCELLANEOUS) ×2 IMPLANT
KIT BASIN OR (CUSTOM PROCEDURE TRAY) ×3 IMPLANT
L-HOOK LAP DISP 36CM (ELECTROSURGICAL)
LEGGING LITHOTOMY PAIR STRL (DRAPES) ×2 IMPLANT
LHOOK LAP DISP 36CM (ELECTROSURGICAL) IMPLANT
MARKER SKIN DUAL TIP RULER LAB (MISCELLANEOUS) ×3 IMPLANT
PAD POSITIONING PINK XL (MISCELLANEOUS) ×1 IMPLANT
PENCIL SMOKE EVACUATOR (MISCELLANEOUS) ×1 IMPLANT
PROTECTOR NERVE ULNAR (MISCELLANEOUS) IMPLANT
SCISSORS LAP 5X45 EPIX DISP (ENDOMECHANICALS) ×3 IMPLANT
SHEARS HARMONIC ACE PLUS 45CM (MISCELLANEOUS) ×3 IMPLANT
SLEEVE XCEL OPT CAN 5 100 (ENDOMECHANICALS) ×8 IMPLANT
SOL ANTI FOG 6CC (MISCELLANEOUS) IMPLANT
SOLUTION ANTI FOG 6CC (MISCELLANEOUS) ×1
STRIP CLOSURE SKIN 1/2X4 (GAUZE/BANDAGES/DRESSINGS) ×2 IMPLANT
SUT ETHIBOND 0 36 GRN (SUTURE) ×9 IMPLANT
SUT ETHILON 2 0 PS N (SUTURE) IMPLANT
SUT MNCRL AB 4-0 PS2 18 (SUTURE) ×3 IMPLANT
SUT SILK 0 SH 30 (SUTURE) IMPLANT
SUT SILK 2 0 SH (SUTURE) ×17 IMPLANT
SUT VIC AB 2-0 SH 27 (SUTURE) ×6
SUT VIC AB 2-0 SH 27X BRD (SUTURE) IMPLANT
TAPE CLOTH 4X10 WHT NS (GAUZE/BANDAGES/DRESSINGS) IMPLANT
TIP INNERVISION DETACH 40FR (MISCELLANEOUS) IMPLANT
TIP INNERVISION DETACH 50FR (MISCELLANEOUS) IMPLANT
TIP INNERVISION DETACH 56FR (MISCELLANEOUS) IMPLANT
TIPS INNERVISION DETACH 40FR (MISCELLANEOUS)
TOWEL OR 17X26 10 PK STRL BLUE (TOWEL DISPOSABLE) ×3 IMPLANT
TOWEL OR NON WOVEN STRL DISP B (DISPOSABLE) ×1 IMPLANT
TRAY LAPAROSCOPIC (CUSTOM PROCEDURE TRAY) ×3 IMPLANT
TROCAR BLADELESS OPT 5 100 (ENDOMECHANICALS) ×3 IMPLANT
TROCAR XCEL 12X100 BLDLESS (ENDOMECHANICALS) ×2 IMPLANT

## 2021-06-13 NOTE — Op Note (Signed)
Patient: Kendra Miles (1950/10/21, [id number])  Date of Surgery: 06/13/21  Preoperative Diagnosis: INCARCERATED HIATAL HERNIA WITH GASTRIC VOLVULUS  Postoperative Diagnosis: INCARCERATED HIATAL HERNIA WITH GASTRIC VOLVULUS   Surgical Procedure: LAPAROSCOPIC HIATAL HERNIA REPAIR: 00174 (CPT) UPPER GI ENDOSCOPY: [id number]   Operative Team Members:  Surgeon(s) and Role:    * Armaan Pond, Nickola Major, MD - Primary    * Johnathan Hausen, MD - Assisting   Anesthesiologist: Suzette Battiest, MD CRNA: Garrel Ridgel, CRNA; Eben Burow, CRNA   Anesthesia: General   Fluids:  Total I/O In: 1250 [I.V.:1000; IV Piggyback:250] Out: 9163 [Urine:500; Other:500; WGYKZ:99]  Complications: None  Drains:  none   Specimen: None  Disposition:  PACU - hemodynamically stable.  Plan of Care: Admit to inpatient   Indications for Procedure: Kendra Miles is a 70 y.o. female who presented with an incarcerated hiatal hernia with gastric volvulus.  She had epigastric pain into her chest.  NG tube was unable to be placed due to her nasal anatomy - her nares were also unable to be traversed in the operating room.  Her pain continued and she continued to vomit.  I recommended emergent hiatal hernia repair with upper endoscopy and possible gastrostomy tube placement.  She was initially cared for by the emergency general surgery service.  I canceled my elective cases today to take her emergently to the operating room.  The procedure itself as well as its risks, benefits and alternatives were discussed.  After a full discussion and all questions answered, the patient granted consent to proceed.    Findings: Edematous and inflamed stomach from being chronically stuck in the chest with evidence of mesoaxial gastric volvulus.    Description of Procedure:   The patient was positioned supine, padded and secured to the bed.  The abdomen was widely prepped and draped.  A time out procedure was  performed.   The abdomen was entered in the right hypochondrium, utilizing a 5 mm optical-viewing trocar.  Upon safe entry into the abdomen, it was insufflated completely.  Under direct visualization, three additional 72mm trocars were placed across the abdomen and a Nathanson liver retractor was placed at the epigastrium and utilized to retract the left lobe of the liver anteriorly.  The gastrohepatic ligament was divided and the right crus was identified.  A blunt dissection was carried out between the right crus and the esophagus.  The phrenoesophageal ligament was divided and dissection was carried up over the top of the esophagus towards the left crus.  The fundus was partially mobilized off of the left hemidiaphragm.  The hernia sac was divided to enter the plane between the sac and the mediastinum.  The hernia sac was divided towards the left and right with continued traction on the hernia sac to reduce it.  A mediastinal dissection was performed to further reduce the hernia sac which facilitated reduction of the stomach as well.  The hernia sac was disconnected from the right and left crura and subsequently, the hernia sac was dissected from the stomach and esophagus, and excised.  The short gastric vessels were divided from the inferior pole of the spleen, superiorly, to completely disconnect the stomach from the spleen and the left hemidiaphragm.  All posterior gastric attachments to the lesser sac and retroperitoneum were divided.  The left crus was further delineated.  A retroesophageal window was created and a grasper was used to retract the esophagus.    A high, circumferential mediastinal dissection was performed in  an effort to mobilize the esophagus and provide for adequate intraabdominal esophageal length.  The mediastinal dissection was performed bluntly, with little to no thermal energy.  The anterior and posterior vagus nerves were both identified and preserved.  The crural defect was  reapproximated with multiple, interrupted, 0 Ethibond sutures.  The crural pillars came together well without tearing of the adjacent diaphragmatic tissue.    A Nissen fundoplication was performed.  The fundus of the stomach was passed behind the esophagus.  A shoeshine maneuver was performed ensuring there was no twisting of the fundus of the stomach.  The fundus of the stomach was floppy and very mobile and adequate for creation of Nissen fundoplication.  It was put in position and then a 0 Ethibond suture was used to take a bite of the greater curve the stomach to the left of the esophagus a muscular bite of the esophagus anteriorly and a bite of the greater curve the stomach to the right of the esophagus.  This for suture was tied down creating the wrap.  I made sure to leave floppiness and the wrap and avoid over tightening the wrap.  Two additional 0-Ethibond sutures were placed through both sides of the fundus to complete the wrap.  He adult upper endoscope was then inserted through the mouth esophagus and into the stomach.  It passed easily through the wrap.  The GE junction was located appropriately below the diaphragm.  The body of the stomach was somewhat narrowed due to being chronically incarcerated in the chest, however the stomach was widely patent.  The foregut was desufflated and the endoscope removed.  We then directed our attention to closure.  The hernia sac was removed through the 5 mm trocar.  The operative field was inspected for hemostasis.  The peritoneal cavity was desufflated and the trocars removed.  The incisions were closed with 4-0 Monocryl subcuticular sutures and skin glue.  Incisions were injected with local.     Louanna Raw, MD General, Bariatric, & Minimally Invasive Surgery [facility], [place]

## 2021-06-13 NOTE — Progress Notes (Signed)
Spoke to [facility], received report for surgery.

## 2021-06-13 NOTE — Anesthesia Preprocedure Evaluation (Signed)
Anesthesia Evaluation  Patient identified by MRN, date of birth, ID band Patient awake    Reviewed: Allergy & Precautions, NPO status , Patient's Chart, lab work & pertinent test results  Airway Mallampati: II  TM Distance: >3 FB Neck ROM: Full    Dental  (+) Dental Advisory Given   Pulmonary neg pulmonary ROS,    breath sounds clear to auscultation       Cardiovascular hypertension, + dysrhythmias  Rhythm:Regular Rate:Normal     Neuro/Psych  Headaches,    GI/Hepatic hiatal hernia, GERD  ,Gastric outlet obstruction and ?gastric volvulus   Endo/Other  diabetes  Renal/GU      Musculoskeletal   Abdominal   Peds  Hematology   Anesthesia Other Findings   Reproductive/Obstetrics                             Lab Results  Component Value Date   WBC 19.9 (H) 06/11/2021   HGB 17.1 (H) 06/11/2021   HCT 53.8 (H) 06/11/2021   MCV 92.4 06/11/2021   PLT 338 06/11/2021   Lab Results  Component Value Date   CREATININE 0.81 06/11/2021   BUN 28 (H) 06/11/2021   NA 138 06/11/2021   K 3.8 06/11/2021   CL 102 06/11/2021   CO2 26 06/11/2021    Anesthesia Physical Anesthesia Plan  ASA: 3 and emergent  Anesthesia Plan: General   Post-op Pain Management:    Induction: Intravenous and Rapid sequence  PONV Risk Score and Plan: 3 and Dexamethasone and Treatment may vary due to age or medical condition  Airway Management Planned: Oral ETT  Additional Equipment: None  Intra-op Plan:   Post-operative Plan: Extubation in OR and Possible Post-op intubation/ventilation  Informed Consent: I have reviewed the patients History and Physical, chart, labs and discussed the procedure including the risks, benefits and alternatives for the proposed anesthesia with the patient or authorized representative who has indicated his/her understanding and acceptance.     Dental advisory given  Plan Discussed  with: CRNA  Anesthesia Plan Comments:         Anesthesia Quick Evaluation

## 2021-06-13 NOTE — Progress Notes (Signed)
MD Hassell Done paged about BP

## 2021-06-13 NOTE — Progress Notes (Signed)
   Subjective/Chief Complaint: Continued epigastric abdominal pain, just received some pain medication. Has had nausea and vomiting. Doesn't tolerate NG placement  Objective: Vital signs in last 24 hours: Temp:  [97.5 F (36.4 C)-99.3 F (37.4 C)] 98.4 F (36.9 C) (10/24 0655) Pulse Rate:  [89-105] 89 (10/24 0655) Resp:  [16-17] 16 (10/24 0515) BP: (133-186)/(75-98) 186/94 (10/24 0702) SpO2:  [90 %-96 %] 91 % (10/24 0655) Last BM Date: 06/07/21  Intake/Output from previous day: 10/23 0701 - 10/24 0700 In: 1670.3 [I.V.:1670.3] Out: 400 [Urine:400] Intake/Output this shift: No intake/output data recorded.  PE:  Constitutional: No acute distress, conversant, appears states age. Eyes: Anicteric sclerae, moist conjunctiva, no lid lag Lungs: Clear to auscultation bilaterally, normal respiratory effort CV: regular rate and rhythm, no murmurs, no peripheral edema, pedal pulses 2+ GI: Soft, no masses or hepatosplenomegaly, some epigastric tenderness Skin: No rashes, palpation reveals normal turgor Psychiatric: appropriate judgment and insight, oriented to person, place, and time   Lab Results:  Recent Labs    06/10/21 1500 06/11/21 0507  WBC 23.2* 19.9*  HGB 18.8* 17.1*  HCT 55.5* 53.8*  PLT 408* 338    BMET Recent Labs    06/10/21 1500 06/11/21 0507  NA 138 138  K 3.7 3.8  CL 95* 102  CO2 24 26  GLUCOSE 304* 267*  BUN 39* 28*  CREATININE 0.88 0.81  CALCIUM 9.7 8.2*    PT/INR No results for input(s): LABPROT, INR in the last 72 hours. ABG No results for input(s): PHART, HCO3 in the last 72 hours.  Invalid input(s): PCO2, PO2  Studies/Results: No results found.     Assessment/Plan: 70 year old female with large incarcerated hiatal hernia with gastric volvulus.  Patient presented to the emergency general surgery service over the weekend.  Some talk with surgeons covering the service that endoscopic decompression may be an option.  I feel emergent  surgery is indicated, concern for gastric volvulus and ischemia.  Will re-arrange surgical schedule in order to operate emergently.  Plan for laparoscopic hiatal hernia repair, possible gastrostomy tube for incarcerated hiatal hernia.  Risks, benefits and alternatives discussed with patient who granted consent to proceed.  Will proceed emergently to OR.     LOS: 3 days    Kendra Miles 06/13/2021

## 2021-06-13 NOTE — Consult Note (Addendum)
Referring Provider: Dr. Hassell Done Primary Care Physician:  Early, Coralee Pesa, NP Primary Gastroenterologist:  Althia Forts  Reason for Consultation:  Gastric obstruction; Nausea/Vomiting; Abnormal CT scan  HPI: Kendra Miles is a 70 y.o. female with known large hiatal hernia and planned hiatal hernia repair this Firday but developed worsened abdominal pain and N/V yesterday and CT scan showed gastric outlet obstruction with question of gastric volvulus with known large hiatal hernia. Pain was 10/10 yesterday and 4/10 now. Patient refused NG tube. EGD in 2020 (Dr. 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  Past Medical History:  Diagnosis Date   Acid reflux    Arthritis    Cholelithiasis with choledocholithiasis 02/2019   Diabetes ([place])    Headache    Hiatal hernia    Hyperlipidemia    Pneumonia    Polyp of stomach and duodenum     Past Surgical History:  Procedure Laterality Date   ABDOMINAL HYSTERECTOMY     partial   APPENDECTOMY     BIOPSY  03/08/2019   Procedure: BIOPSY;  Surgeon: Gatha Mayer, MD;  Location: [facility];  Service: Endoscopy;;   CHOLECYSTECTOMY N/A 03/09/2019   Procedure: laparoscopic cholecystectomy;  Surgeon: Jovita Kussmaul, MD;  Location: [facility];  Service: General;  Laterality: N/A;   CYST REMOVAL TRUNK     ERCP N/A 03/08/2019   Procedure: ENDOSCOPIC RETROGRADE CHOLANGIOPANCREATOGRAPHY (ERCP);  Surgeon: Gatha Mayer, MD;  Location: [facility];  Service: Endoscopy;  Laterality: N/A;   ESOPHAGOGASTRODUODENOSCOPY (EGD) WITH PROPOFOL N/A 04/24/2019   Procedure: ESOPHAGOGASTRODUODENOSCOPY (EGD) WITH PROPOFOL;  Surgeon: Ronnette Juniper, MD;  Location: [facility];  Service: Gastroenterology;  Laterality: N/A;   HEMOSTASIS CLIP PLACEMENT  04/24/2019   Procedure: HEMOSTASIS CLIP PLACEMENT;  Surgeon: Ronnette Juniper, MD;  Location: [facility];  Service: Gastroenterology;;   PANCREATIC STENT PLACEMENT  03/08/2019   Procedure:  PANCREATIC STENT PLACEMENT;  Surgeon: Gatha Mayer, MD;  Location: [facility];  Service: Endoscopy;;   POLYPECTOMY  04/24/2019   Procedure: POLYPECTOMY;  Surgeon: Ronnette Juniper, MD;  Location: [facility];  Service: Gastroenterology;;   REMOVAL OF STONES  03/08/2019   Procedure: REMOVAL OF STONES;  Surgeon: Gatha Mayer, MD;  Location: [facility];  Service: Endoscopy;;   SPHINCTEROTOMY  03/08/2019   Procedure: Joan Mayans;  Surgeon: Gatha Mayer, MD;  Location: [facility];  Service: Endoscopy;;    Prior to Admission medications   Medication Sig Start Date End Date Taking? Authorizing Provider  glipiZIDE (GLUCOTROL) 5 MG tablet Take 1 tablet (5 mg total) by mouth 2 (two) times daily before a meal. 05/06/21  Yes Early, Coralee Pesa, NP  lisinopril (ZESTRIL) 10 MG tablet Take 1 tablet (10 mg total) by mouth daily. 05/06/21  Yes Early, Coralee Pesa, NP  loratadine (CLARITIN) 10 MG tablet Take 1 tablet (10 mg total) by mouth daily. 02/04/21  Yes Early, Coralee Pesa, NP  meloxicam (MOBIC) 7.5 MG tablet Take 1 tablet (7.5 mg total) by mouth daily. 05/06/21  Yes Early, Coralee Pesa, NP  Multiple Vitamin (MULTIVITAMIN WITH MINERALS) TABS tablet Take 1 tablet by mouth daily. Women's 50+ Multivitamin   Yes [provider]  Omega-3 Fatty Acids (FISH OIL PO) Take 2,000 mg by mouth daily.   Yes [provider]  ondansetron (ZOFRAN) 4 MG tablet Take 1 tablet (4 mg total) by mouth every 8 (eight) hours as needed for nausea or vomiting. 05/06/21  Yes Early, Coralee Pesa, NP  pantoprazole (PROTONIX) 40 MG tablet TAKE 1 TABLET BY MOUTH TWICE A DAY. AFTER FIRST 30 DAYS,DECREASE TO ONCE DAILY OR AS DIRECTED BY GI 04/28/21  Yes Early, Coralee Pesa, NP  PARoxetine (PAXIL) 30 MG tablet Take 1 tablet (30 mg total) by mouth daily. 02/03/21  Yes Early, Coralee Pesa, NP  rosuvastatin (CRESTOR) 5 MG tablet Take 5 mg by mouth daily.   Yes [provider]  aspirin 81 MG chewable tablet Chew 1 tablet (81 mg total) by mouth  daily. Patient not taking: No sig reported 11/02/20   Early, Coralee Pesa, NP  blood glucose meter kit and supplies Testing at least once per day (morning fasting), with option to test up to 4 times per day (morning fasting and as needed for symptoms) as needed for better control. Use only the strips that come in this kit with this meter- other strips will not work properly. 11/02/20   Orma Render, NP  calcium carbonate (OSCAL) 1500 (600 Ca) MG TABS tablet Take 0.4 tablets (600 mg total) by mouth daily. Patient not taking: Reported on 06/10/2021 11/02/20   Orma Render, NP  emollient lotion Apply topically 2 (two) times daily as needed. Apply to legs as needed for itching and dryness. Patient not taking: Reported on 06/10/2021 11/02/20   Early, Coralee Pesa, NP  lisinopril (ZESTRIL) 5 MG tablet Take 5 mg by mouth daily. 05/27/21   [provider]  metFORMIN (GLUCOPHAGE) 500 MG tablet Take 1 tablet (500 mg total) by mouth 2 (two) times daily with a meal. Patient not taking: No sig reported 05/06/21   Early, Coralee Pesa, NP  rosuvastatin (CRESTOR) 10 MG tablet Take 1 tablet (10 mg total) by mouth daily. Patient not taking: Reported on 06/10/2021 03/18/21   Orma Render, NP    Scheduled Meds:  enoxaparin (LOVENOX) injection  40 mg Subcutaneous Q24H   insulin aspart  0-15 Units Subcutaneous TID WC   [START ON 06/14/2021] pantoprazole  40 mg Intravenous Q12H   Continuous Infusions:  sodium chloride 100 mL/hr at 06/11/21 1954   dextrose 5 % and 0.9% NaCl 100 mL/hr at 06/12/21 0552   pantoprazole 8 mg/hr (06/11/21 0535)   PRN Meds:.HYDROmorphone (DILAUDID) injection, ondansetron **OR** ondansetron (ZOFRAN) IV  Allergies as of 06/10/2021 - Review Complete 06/10/2021  Allergen Reaction Noted   Ciprofloxacin Other (See Comments) 02/27/2011   Guaifenesin er Other (See Comments) 06/29/2020   Penicillins Palpitations 03/08/2019    Family History  Problem Relation Age of Onset   Memory loss Mother    COPD  Father     Social History   Socioeconomic History   Marital status: Widowed    Spouse name: Not on file   Number of children: 4   Years of education: Not on file   Highest education level: Not on file  Occupational History   Occupation: Surveyor, quantity: [employer]    Comment: 27 years  Tobacco Use   Smoking status: Never   Smokeless tobacco: Never  Vaping Use   Vaping Use: Never used  Substance and Sexual Activity   Alcohol use: No   Drug use: Never   Sexual activity: Not Currently  Other Topics Concern   Not on file  Social History Narrative   Lives alone.    4 adult children, 3 sons and 1 daughter. One son lives in the area, other children live in [place] with 9 grandchildren and 2 great grandchildren +1 on the way. She visits  them every year.    Mother (24) and Father (82) both alive and live in [place].       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    Social Determinants of Health   Financial Resource Strain: Not on file  Food Insecurity: Not on file  Transportation Needs: No Transportation Needs   Lack of Transportation (Medical): No   Lack of Transportation (Non-Medical): No  Physical Activity: Insufficiently Active   Days of Exercise per Week: 2 days   Minutes of Exercise per Session: 10 min  Stress: Not on file  Social Connections: Not on file  Intimate Partner Violence: Not on file    Review of Systems: All negative except as stated above in HPI.  Physical Exam: Vital signs: Vitals:   06/13/21 0655 06/13/21 0702  BP: (!) 185/98 (!) 186/94  Pulse: 89   Resp:    Temp: 98.4 F (36.9 C)   SpO2: 91%   R 16  Last BM Date: 06/07/21 General:   Lethargic, elderly, well-nourished, no acute distress  Head: normocephalic, atraumatic Eyes: anicteric sclera ENT: oropharynx clear Neck: supple, nontender Lungs:  Clear throughout to auscultation.   No wheezes, crackles, or rhonchi. No  acute distress. Heart:  Regular rate and rhythm; no murmurs, clicks, rubs,  or gallops. Abdomen: upper quadrant tenderness (greatest in RUQ and epigastric) with guarding, soft, nondistended, +BS  Rectal:  Deferred Ext: no edema  GI:  Lab Results: Recent Labs    06/10/21 1500 06/11/21 0507  WBC 23.2* 19.9*  HGB 18.8* 17.1*  HCT 55.5* 53.8*  PLT 408* 338   BMET Recent Labs    06/10/21 1500 06/11/21 0507  NA 138 138  K 3.7 3.8  CL 95* 102  CO2 24 26  GLUCOSE 304* 267*  BUN 39* 28*  CREATININE 0.88 0.81  CALCIUM 9.7 8.2*   LFT Recent Labs    06/10/21 1500  PROT 8.5*  ALBUMIN 5.0  AST 24  ALT 18  ALKPHOS 97  BILITOT 1.6*   PT/INR No results for input(s): LABPROT, INR in the last 72 hours.   Studies/Results: No results found.  Impression/Plan: 71 yo with large hiatal hernia with signs of gastric outlet obstruction and question of gastric volvulus. EGD today to further evaluate. Patient previously refused NG tube and says only if it is absolutely necessary but if vomiting develops then likely will need to have it placed to low wall intermittent suction following the EGD. NPO. Supportive care.   Addendum: Just called by surgery team and they plan to take her to surgery today so EGD has been cancelled.    LOS: 3 days   Lear Ng  06/13/2021, 8:53 AM  Questions please call [phone]

## 2021-06-13 NOTE — Anesthesia Procedure Notes (Signed)
Procedure Name: Intubation Date/Time: 06/13/2021 1:18 PM Performed by: Eben Burow, CRNA Pre-anesthesia Checklist: Patient identified, Emergency Drugs available, Suction available, Patient being monitored and Timeout performed Patient Re-evaluated:Patient Re-evaluated prior to induction Oxygen Delivery Method: Circle system utilized Preoxygenation: Pre-oxygenation with 100% oxygen Induction Type: IV induction, Rapid sequence and Cricoid Pressure applied Laryngoscope Size: Mac and 4 Grade View: Grade I Tube type: Oral Tube size: 7.0 mm Number of attempts: 1 Airway Equipment and Method: Stylet Placement Confirmation: ETT inserted through vocal cords under direct vision, positive ETCO2 and breath sounds checked- equal and bilateral Secured at: 21 cm Tube secured with: Tape Dental Injury: Teeth and Oropharynx as per pre-operative assessment

## 2021-06-13 NOTE — Transfer of Care (Signed)
Immediate Anesthesia Transfer of Care Note  Patient: Kendra Miles  Procedure(s) Performed: LAPAROSCOPIC HIATAL HERNIA REPAIR (Abdomen) UPPER GI ENDOSCOPY (Mouth)  Patient Location: PACU  Anesthesia Type:General  Level of Consciousness: oriented, drowsy and patient cooperative  Airway & Oxygen Therapy: Patient Spontanous Breathing and Patient connected to face mask oxygen  Post-op Assessment: Report given to RN and Post -op Vital signs reviewed and stable  Post vital signs: Reviewed and stable  Last Vitals:  Vitals Value Taken Time  BP    Temp    Pulse 100 06/13/21 1540  Resp 18 06/13/21 1540  SpO2 97 % 06/13/21 1540  Vitals shown include unvalidated device data.  Last Pain:  Vitals:   06/13/21 1236  TempSrc:   PainSc: 0-No pain      Patients Stated Pain Goal: 2 (52/77/82 4235)  Complications: No notable events documented.

## 2021-06-14 ENCOUNTER — Encounter (HOSPITAL_COMMUNITY): Payer: Self-pay | Admitting: Surgery

## 2021-06-14 LAB — CBC
HCT: 41.6 % (ref 36.0–46.0)
Hemoglobin: 13.5 g/dL (ref 12.0–15.0)
MCH: 29.5 pg (ref 26.0–34.0)
MCHC: 32.5 g/dL (ref 30.0–36.0)
MCV: 91 fL (ref 80.0–100.0)
Platelets: 269 10*3/uL (ref 150–400)
RBC: 4.57 MIL/uL (ref 3.87–5.11)
RDW: 13.5 % (ref 11.5–15.5)
WBC: 13.3 10*3/uL — ABNORMAL HIGH (ref 4.0–10.5)
nRBC: 0 % (ref 0.0–0.2)

## 2021-06-14 LAB — BASIC METABOLIC PANEL
Anion gap: 6 (ref 5–15)
BUN: 16 mg/dL (ref 8–23)
CO2: 30 mmol/L (ref 22–32)
Calcium: 7.7 mg/dL — ABNORMAL LOW (ref 8.9–10.3)
Chloride: 107 mmol/L (ref 98–111)
Creatinine, Ser: 0.65 mg/dL (ref 0.44–1.00)
GFR, Estimated: >60 mL/min (ref >60)
Glucose, Bld: 165 mg/dL — ABNORMAL HIGH (ref 70–99)
Potassium: 3.4 mmol/L — ABNORMAL LOW (ref 3.5–5.1)
Sodium: 143 mmol/L (ref 135–145)

## 2021-06-14 LAB — GLUCOSE, CAPILLARY
Glucose-Capillary: 135 mg/dL — ABNORMAL HIGH (ref 70–99)
Glucose-Capillary: 126 mg/dL — ABNORMAL HIGH (ref 70–99)
Glucose-Capillary: 144 mg/dL — ABNORMAL HIGH (ref 70–99)
Glucose-Capillary: 188 mg/dL — ABNORMAL HIGH (ref 70–99)

## 2021-06-14 MED ORDER — PANTOPRAZOLE SODIUM 40 MG PO TBEC
40.0000 mg | DELAYED_RELEASE_TABLET | Freq: Two times a day (BID) | ORAL | Status: DC
  Administered 2021-06-14: 40 mg via ORAL
  Filled 2021-06-14: qty 1

## 2021-06-14 MED ORDER — GLUCERNA SHAKE PO LIQD
237.0000 mL | Freq: Three times a day (TID) | ORAL | Status: DC
  Administered 2021-06-14: 237 mL via ORAL
  Filled 2021-06-14 (×5): qty 237

## 2021-06-14 NOTE — Progress Notes (Signed)
Progress Note: General Surgery Service   Chief Complaint/Subjective: Swallowing better.  Preoperative pain improved.  No heart burn or dysphagia complaints this AM.  Objective: Vital signs in last 24 hours: Temp:  [97.5 F (36.4 C)-98.7 F (37.1 C)] 97.6 F (36.4 C) (10/25 0509) Pulse Rate:  [67-101] 67 (10/25 0509) Resp:  [12-18] 16 (10/25 0509) BP: (138-189)/(77-113) 153/89 (10/25 0509) SpO2:  [89 %-99 %] 99 % (10/25 0509) Weight:  [80 kg] 80 kg (10/24 1237) Last BM Date: 06/07/21  Intake/Output from previous day: 10/24 0701 - 10/25 0700 In: 3062.9 [P.O.:480; I.V.:2332.9; IV Piggyback:250] Out: 2070 [Urine:1550; Blood:20] Intake/Output this shift: No intake/output data recorded.  GI: Abd Soft, incisions c/d/I w/ glue; no palpable hepatosplenomegaly  Lab Results: CBC  Recent Labs    06/14/21 0417  WBC 13.3*  HGB 13.5  HCT 41.6  PLT 269   BMET Recent Labs    06/14/21 0417  NA 143  K 3.4*  CL 107  CO2 30  GLUCOSE 165*  BUN 16  CREATININE 0.65  CALCIUM 7.7*   PT/INR No results for input(s): LABPROT, INR in the last 72 hours. ABG No results for input(s): PHART, HCO3 in the last 72 hours.  Invalid input(s): PCO2, PO2  Anti-infectives: Anti-infectives (From admission, onward)    Start     Dose/Rate Route Frequency Ordered Stop   06/13/21 1145  clindamycin (CLEOCIN) IVPB 900 mg        900 mg 100 mL/hr over 30 Minutes Intravenous On call to O.R. 06/13/21 1050 06/13/21 1330       Medications: Scheduled Meds:  acetaminophen  650 mg Oral Q6H   aspirin  81 mg Oral Daily   Chlorhexidine Gluconate Cloth  6 each Topical Daily   docusate sodium  100 mg Oral BID   enoxaparin (LOVENOX) injection  40 mg Subcutaneous Q24H   feeding supplement (GLUCERNA SHAKE)  237 mL Oral TID BM   insulin aspart  0-15 Units Subcutaneous TID WC   metoCLOPramide (REGLAN) injection  10 mg Intravenous Q6H   pantoprazole  40 mg Intravenous Q12H   PARoxetine  30 mg Oral Daily    rosuvastatin  5 mg Oral Daily   Continuous Infusions:  sodium chloride 100 mL/hr at 06/14/21 0029   methocarbamol (ROBAXIN) IV     PRN Meds:.HYDROmorphone (DILAUDID) injection, methocarbamol (ROBAXIN) IV, ondansetron **OR** ondansetron (ZOFRAN) IV, oxyCODONE, oxyCODONE, prochlorperazine  Assessment/Plan: 70 year old female with large incarcerated hiatal hernia with gastric volvulus s/p laparoscopic nissen fundoplication and hiatal hernia repair on 06/13/21.     LOS: 4 days   FEN: FLD, supplements, FLD teaching with nutrition - plan for 2 weeks FLD at discharge, then slowly advance diet consistency ID: None VTE: SCDs, Lovenox Foley: None Dispo: Continued care on floor    Felicie Morn, MD  [facility] Use [email or web address] to contact on call provider

## 2021-06-14 NOTE — Progress Notes (Signed)
Vitals were entered in manually at 1400 not 1300

## 2021-06-14 NOTE — Progress Notes (Signed)

## 2021-06-14 NOTE — Care Management Important Message (Signed)
Important Message  Patient Details IM Letter given to the Patient. Name: Kendra Miles MRN: [id number] Date of Birth: 27-Jan-1951   Medicare Important Message Given:  Yes     Kerin Salen 06/14/2021, 1:41 PM

## 2021-06-14 NOTE — Anesthesia Postprocedure Evaluation (Signed)
Anesthesia Post Note  Patient: KURSTYN LARIOS  Procedure(s) Performed: LAPAROSCOPIC HIATAL HERNIA REPAIR (Abdomen) UPPER GI ENDOSCOPY (Mouth)     Patient location during evaluation: PACU Anesthesia Type: General Level of consciousness: awake and alert Pain management: pain level controlled Vital Signs Assessment: post-procedure vital signs reviewed and stable Respiratory status: spontaneous breathing, nonlabored ventilation, respiratory function stable and patient connected to nasal cannula oxygen Cardiovascular status: blood pressure returned to baseline and stable Postop Assessment: no apparent nausea or vomiting Anesthetic complications: no   No notable events documented.  Last Vitals:  Vitals:   06/14/21 1730 06/14/21 2019  BP: (!) 149/74 (!) 154/85  Pulse: 82 70  Resp: 16 18  Temp: 36.4 C 36.6 C  SpO2: 95% 94%    Last Pain:  Vitals:   06/14/21 2059  TempSrc:   PainSc: 0-No pain                 Tiajuana Amass

## 2021-06-14 NOTE — Progress Notes (Signed)
[facility] Gastroenterology Progress Note  JOSELLE DEEDS 70 y.o. 06/10/1951  CC: Gastric outlet obstruction, incarcerated hiatal hernia   Subjective: S/p laparoscopic nissen fundoplication and hiatal hernia repair on 06/13/21. Patient states pain has improved, swelling has improved. Denies nausea, vomiting. He is passing gas, no stools.  ROS : ROS See HPI  Objective: Vital signs in last 24 hours: Vitals:   06/14/21 0509 06/14/21 0957  BP: (!) 153/89 (!) 155/81  Pulse: 67 74  Resp: 16 20  Temp: 97.6 F (36.4 C) 97.8 F (36.6 C)  SpO2: 99% 95%    Physical Exam: General:   Alert, in NAD GI: Abdomen soft, well-healed surgical incisions with glue, nondistended, sluggish bowel sounds, nontender Neurologic:  Alert and  oriented x4;  grossly normal neurologically. Psych:  Alert and cooperative. Normal mood and affect.   Lab Results: Recent Labs    06/14/21 0417  NA 143  K 3.4*  CL 107  CO2 30  GLUCOSE 165*  BUN 16  CREATININE 0.65  CALCIUM 7.7*   No results for input(s): AST, ALT, ALKPHOS, BILITOT, PROT, ALBUMIN in the last 72 hours. Recent Labs    06/14/21 0417  WBC 13.3*  HGB 13.5  HCT 41.6  MCV 91.0  PLT 269   No results for input(s): LABPROT, INR in the last 72 hours.  Lab Results: Results for orders placed or performed during the hospital encounter of 06/10/21 (from the past 48 hour(s))  Glucose, capillary     Status: Abnormal   Collection Time: 06/12/21  4:28 PM  Result Value Ref Range   Glucose-Capillary 186 (H) 70 - 99 mg/dL    Comment: Glucose reference range applies only to samples taken after fasting for at least 8 hours.  Glucose, capillary     Status: Abnormal   Collection Time: 06/12/21  8:54 PM  Result Value Ref Range   Glucose-Capillary 169 (H) 70 - 99 mg/dL    Comment: Glucose reference range applies only to samples taken after fasting for at least 8 hours.  Glucose, capillary     Status: Abnormal   Collection Time: 06/13/21  7:57 AM   Result Value Ref Range   Glucose-Capillary 233 (H) 70 - 99 mg/dL    Comment: Glucose reference range applies only to samples taken after fasting for at least 8 hours.  Glucose, capillary     Status: Abnormal   Collection Time: 06/13/21 12:41 PM  Result Value Ref Range   Glucose-Capillary 175 (H) 70 - 99 mg/dL    Comment: Glucose reference range applies only to samples taken after fasting for at least 8 hours.  Glucose, capillary     Status: Abnormal   Collection Time: 06/13/21  5:31 PM  Result Value Ref Range   Glucose-Capillary 193 (H) 70 - 99 mg/dL    Comment: Glucose reference range applies only to samples taken after fasting for at least 8 hours.  Glucose, capillary     Status: Abnormal   Collection Time: 06/13/21  9:14 PM  Result Value Ref Range   Glucose-Capillary 229 (H) 70 - 99 mg/dL    Comment: Glucose reference range applies only to samples taken after fasting for at least 8 hours.  Basic metabolic panel     Status: Abnormal   Collection Time: 06/14/21  4:17 AM  Result Value Ref Range   Sodium 143 135 - 145 mmol/L   Potassium 3.4 (L) 3.5 - 5.1 mmol/L   Chloride 107 98 - 111 mmol/L   CO2  30 22 - 32 mmol/L   Glucose, Bld 165 (H) 70 - 99 mg/dL    Comment: Glucose reference range applies only to samples taken after fasting for at least 8 hours.   BUN 16 8 - 23 mg/dL   Creatinine, Ser 0.65 0.44 - 1.00 mg/dL   Calcium 7.7 (L) 8.9 - 10.3 mg/dL   GFR, Estimated >60 >60 mL/min    Comment: (NOTE) Calculated using the CKD-EPI Creatinine Equation (2021)    Anion gap 6 5 - 15    Comment: Performed at [facility], [place] [address] [place], [place] [address]  CBC     Status: Abnormal   Collection Time: 06/14/21  4:17 AM  Result Value Ref Range   WBC 13.3 (H) 4.0 - 10.5 K/uL   RBC 4.57 3.87 - 5.11 MIL/uL   Hemoglobin 13.5 12.0 - 15.0 g/dL   HCT 41.6 36.0 - 46.0 %   MCV 91.0 80.0 - 100.0 fL   MCH 29.5 26.0 - 34.0 pg   MCHC 32.5 30.0 - 36.0 g/dL   RDW 13.5  11.5 - 15.5 %   Platelets 269 150 - 400 K/uL   nRBC 0.0 0.0 - 0.2 %    Comment: Performed at [facility], [place] [address], [place], [place] [address]  Glucose, capillary     Status: Abnormal   Collection Time: 06/14/21  7:53 AM  Result Value Ref Range   Glucose-Capillary 135 (H) 70 - 99 mg/dL    Comment: Glucose reference range applies only to samples taken after fasting for at least 8 hours.  Glucose, capillary     Status: Abnormal   Collection Time: 06/14/21 11:26 AM  Result Value Ref Range   Glucose-Capillary 126 (H) 70 - 99 mg/dL    Comment: Glucose reference range applies only to samples taken after fasting for at least 8 hours.    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  General surgery following. [facility] will sign off. Please contact us if we can be of any further assistance during this hospital stay.      Vladimir Crofts PA-C 06/14/2021, 12:01 PM  Contact #  [phone]

## 2021-06-15 LAB — GLUCOSE, CAPILLARY: Glucose-Capillary: 141 mg/dL — ABNORMAL HIGH (ref 70–99)

## 2021-06-15 LAB — BASIC METABOLIC PANEL
Anion gap: 5 (ref 5–15)
BUN: 13 mg/dL (ref 8–23)
CO2: 30 mmol/L (ref 22–32)
Calcium: 7.6 mg/dL — ABNORMAL LOW (ref 8.9–10.3)
Chloride: 105 mmol/L (ref 98–111)
Creatinine, Ser: 0.68 mg/dL (ref 0.44–1.00)
GFR, Estimated: >60 mL/min (ref >60)
Glucose, Bld: 146 mg/dL — ABNORMAL HIGH (ref 70–99)
Potassium: 3.5 mmol/L (ref 3.5–5.1)
Sodium: 140 mmol/L (ref 135–145)

## 2021-06-15 LAB — CBC
HCT: 42.4 % (ref 36.0–46.0)
Hemoglobin: 13.8 g/dL (ref 12.0–15.0)
MCH: 30 pg (ref 26.0–34.0)
MCHC: 32.5 g/dL (ref 30.0–36.0)
MCV: 92.2 fL (ref 80.0–100.0)
Platelets: 229 10*3/uL (ref 150–400)
RBC: 4.6 MIL/uL (ref 3.87–5.11)
RDW: 13.6 % (ref 11.5–15.5)
WBC: 10.9 10*3/uL — ABNORMAL HIGH (ref 4.0–10.5)
nRBC: 0 % (ref 0.0–0.2)

## 2021-06-15 MED ORDER — OXYCODONE-ACETAMINOPHEN 5-325 MG PO TABS: 1.0000 | ORAL_TABLET | ORAL | 0 refills | Status: DC | PRN

## 2021-06-15 NOTE — Progress Notes (Signed)
Pts son called to ask about his mom needing a chair to help her sit on her toilet at home. Pt asked about this and states she does not need this. [place] MD messaged and MD states the pt can guide her own care. This RN observed the pt sitting and standing in the room on the bench, bed, toilet and chair independently. Son updated.   Pt given discharge packet and discharge instructions. All questions answered. IV removed. All sons questions answered. Pt taken downstairs via wheelchair by staff.

## 2021-06-15 NOTE — Discharge Summary (Signed)
Patient ID: Kendra Miles [id number] 70 y.o. 09-18-1950  06/10/2021  Discharge date and time: 06/15/2021  Admitting Physician: [place]  Discharge Physician: [place]  Admission Diagnoses: Hiatal hernia [K44.9] Gastric dilation [K31.89] Gastric outlet obstruction [K31.1] Patient Active Problem List   Diagnosis Date Noted   Emesis, persistent 06/10/2021   Gastric dilation 06/10/2021   DOE (dyspnea on exertion) 05/30/2021   RBBB 05/30/2021   PAC (premature atrial contraction) 05/30/2021   Fall 05/06/2021   Upper GI bleed 02/03/2021   Flatulence, eructation and gas pain 11/03/2020   Back pain 11/02/2020   Chronic headaches 11/02/2020   Lower extremity edema 11/02/2020   Prolapsed bladder 11/02/2020   Vitamin D deficiency 11/02/2020   Encounter to establish care with new doctor 11/02/2020   Actinic keratosis 11/02/2020   Non-seasonal allergic rhinitis due to pollen 11/02/2020   Calcium deficiency 11/02/2020   Menopausal symptoms 11/02/2020   Polyp of stomach and duodenum    Choledocholithiasis with obstruction 03/06/2019   Hypertension associated with diabetes ([place]) 03/06/2019   Type 2 diabetes mellitus with hyperglycemia, with long-term current use of insulin ([place]) 03/06/2019   GERD (gastroesophageal reflux disease) 03/06/2019   Hyperlipidemia associated with type 2 diabetes mellitus ([place]) 04/17/2011   Thyroid nodule 04/17/2011   Nontoxic multinodular goiter 12/03/2007     Discharge Diagnoses: gastric volvulus with incarcerated hiatal hernia Patient Active Problem List   Diagnosis Date Noted   Emesis, persistent 06/10/2021   Gastric dilation 06/10/2021   DOE (dyspnea on exertion) 05/30/2021   RBBB 05/30/2021   PAC (premature atrial contraction) 05/30/2021   Fall 05/06/2021   Upper GI bleed 02/03/2021   Flatulence, eructation and gas pain 11/03/2020   Back pain 11/02/2020   Chronic headaches 11/02/2020   Lower extremity edema 11/02/2020    Prolapsed bladder 11/02/2020   Vitamin D deficiency 11/02/2020   Encounter to establish care with new doctor 11/02/2020   Actinic keratosis 11/02/2020   Non-seasonal allergic rhinitis due to pollen 11/02/2020   Calcium deficiency 11/02/2020   Menopausal symptoms 11/02/2020   Polyp of stomach and duodenum    Choledocholithiasis with obstruction 03/06/2019   Hypertension associated with diabetes ([place]) 03/06/2019   Type 2 diabetes mellitus with hyperglycemia, with long-term current use of insulin ([place]) 03/06/2019   GERD (gastroesophageal reflux disease) 03/06/2019   Hyperlipidemia associated with type 2 diabetes mellitus ([place]) 04/17/2011   Thyroid nodule 04/17/2011   Nontoxic multinodular goiter 12/03/2007    Operations: Procedure(s): LAPAROSCOPIC HIATAL HERNIA REPAIR UPPER GI ENDOSCOPY  Admission Condition: good  Discharged Condition: good  Indication for Admission: Incarcerated hiatal hernia with gastric volvulus  Hospital Course:  70 year old female with large incarcerated hiatal hernia with gastric volvulus s/p laparoscopic nissen fundoplication and hiatal hernia repair on 06/13/21.  She recovered well and was discharged  Consults: None  Significant Diagnostic Studies: none  Treatments: surgery: as above  Disposition: Home  Patient Instructions:  Allergies as of 06/15/2021       Reactions   Ciprofloxacin Other (See Comments)   Stomach cramps   Guaifenesin Er Other (See Comments)   Mucinex - keeps pt awake all night    Penicillins Palpitations   Did it involve swelling of the face/tongue/throat, SOB, or low BP? No Did it involve sudden or severe rash/hives, skin peeling, or any reaction on the inside of your mouth or nose? No Did you need to seek medical attention at a hospital or doctor's office? No When did it last happen? [phone]  If all above answers are "NO", may proceed with cephalosporin use.        Medication List     TAKE these medications     Aspirin Low Dose 81 MG chewable tablet Generic drug: aspirin Chew 1 tablet (81 mg total) by mouth daily.   blood glucose meter kit and supplies Testing at least once per day (morning fasting), with option to test up to 4 times per day (morning fasting and as needed for symptoms) as needed for better control. Use only the strips that come in this kit with this meter- other strips will not work properly.   calcium carbonate 1500 (600 Ca) MG Tabs tablet Commonly known as: OSCAL Take 0.4 tablets (600 mg total) by mouth daily.   emollient lotion Apply topically 2 (two) times daily as needed. Apply to legs as needed for itching and dryness.   FISH OIL PO Take 2,000 mg by mouth daily.   glipiZIDE 5 MG tablet Commonly known as: GLUCOTROL Take 1 tablet (5 mg total) by mouth 2 (two) times daily before a meal.   lisinopril 10 MG tablet Commonly known as: ZESTRIL Take 1 tablet (10 mg total) by mouth daily.   lisinopril 5 MG tablet Commonly known as: ZESTRIL Take 5 mg by mouth daily.   loratadine 10 MG tablet Commonly known as: CLARITIN Take 1 tablet (10 mg total) by mouth daily.   meloxicam 7.5 MG tablet Commonly known as: MOBIC Take 1 tablet (7.5 mg total) by mouth daily.   metFORMIN 500 MG tablet Commonly known as: GLUCOPHAGE Take 1 tablet (500 mg total) by mouth 2 (two) times daily with a meal.   multivitamin with minerals Tabs tablet Take 1 tablet by mouth daily. Women's 50+ Multivitamin   ondansetron 4 MG tablet Commonly known as: Zofran Take 1 tablet (4 mg total) by mouth every 8 (eight) hours as needed for nausea or vomiting.   oxyCODONE-acetaminophen 5-325 MG tablet Commonly known as: Percocet Take 1 tablet by mouth every 4 (four) hours as needed for severe pain.   pantoprazole 40 MG tablet Commonly known as: PROTONIX TAKE 1 TABLET BY MOUTH TWICE A DAY. AFTER FIRST 30 DAYS,DECREASE TO ONCE DAILY OR AS DIRECTED BY GI   PARoxetine 30 MG tablet Commonly known as:  PAXIL Take 1 tablet (30 mg total) by mouth daily.   rosuvastatin 5 MG tablet Commonly known as: CRESTOR Take 5 mg by mouth daily.   rosuvastatin 10 MG tablet Commonly known as: CRESTOR Take 1 tablet (10 mg total) by mouth daily.        Activity: no heavy lifting for 4 weeks Diet:  full liquid diet for 2 weeks, then advance as tolerated Wound Care: keep wound clean and dry  Follow-up:  With Dr. Thermon Leyland in 4 weeks.  Signed: [place] [place]   06/15/2021, 7:29 AM

## 2021-06-17 ENCOUNTER — Inpatient Hospital Stay (HOSPITAL_COMMUNITY): Admission: RE | Admit: 2021-06-17 | Payer: Medicare Other | Source: Home / Self Care | Admitting: Surgery

## 2021-06-17 ENCOUNTER — Encounter (HOSPITAL_COMMUNITY): Admission: RE | Payer: Self-pay | Source: Home / Self Care

## 2021-06-17 SURGERY — REPAIR, HERNIA, HIATAL, ROBOT-ASSISTED
Anesthesia: General

## 2021-06-19 ENCOUNTER — Encounter (HOSPITAL_BASED_OUTPATIENT_CLINIC_OR_DEPARTMENT_OTHER): Payer: Self-pay | Admitting: Nurse Practitioner

## 2021-06-20 ENCOUNTER — Telehealth (HOSPITAL_BASED_OUTPATIENT_CLINIC_OR_DEPARTMENT_OTHER): Payer: Self-pay

## 2021-06-20 NOTE — Telephone Encounter (Signed)
Transition Care Management Follow-up Telephone Call Date of discharge and from where: 06/15/2021 [facility] How have you been since you were released from the hospital? "Doing very well- hungry" Any questions or concerns? No  Items Reviewed: Did the pt receive and understand the discharge instructions provided? Yes  Medications obtained and verified? Yes  Other? No  Any new allergies since your discharge? No  Dietary orders reviewed? Yes Do you have support at home? Yes   Home Care and Equipment/Supplies: Were home health services ordered? no If so, what is the name of the agency?  Has the agency set up a time to come to the patient's home?  Were any new equipment or medical supplies ordered?  no What is the name of the medical supply agency? no Were you able to get the supplies/equipment?  Do you have any questions related to the use of the equipment or supplies? no  Functional Questionnaire: (I = Independent and D = Dependent) ADLs: I  Bathing/Dressing- I  Meal Prep- I  Eating- I  Maintaining continence- I  Transferring/Ambulation- I  Managing Meds- I  Follow up appointments reviewed:  PCP Hospital f/u appt confirmed? Yes  Scheduled to see PCP on 06/30/2021  Specialist Hospital f/u appt confirmed? Yes  Scheduled to see SURGEON on 06/27/2021  Are transportation arrangements needed? No  If their condition worsens, is the pt aware to call PCP or go to the Emergency Dept.? Yes Was the patient provided with contact information for the PCP's office or ED? Yes Was to pt encouraged to call back with questions or concerns? Yes  Tomasa Rand, RN, BSN, CEN [facility] [employer] [phone]

## 2021-06-23 ENCOUNTER — Telehealth (HOSPITAL_COMMUNITY): Payer: Self-pay | Admitting: Internal Medicine

## 2021-06-23 NOTE — Telephone Encounter (Signed)
Just an FYI. We have made several attempts to contact this patient including sending a letter to schedule or reschedule their echocardiogram. We will be removing the patient from the echo [facility].    06/08/21 NO SHOW-MAILED LETTER [facility]   Thank you

## 2021-06-30 DIAGNOSIS — Z23 Encounter for immunization: Secondary | ICD-10-CM | POA: Diagnosis not present

## 2021-07-20 ENCOUNTER — Encounter (HOSPITAL_BASED_OUTPATIENT_CLINIC_OR_DEPARTMENT_OTHER): Payer: Self-pay | Admitting: Nurse Practitioner

## 2021-07-20 ENCOUNTER — Other Ambulatory Visit: Payer: Self-pay

## 2021-07-20 ENCOUNTER — Telehealth (HOSPITAL_BASED_OUTPATIENT_CLINIC_OR_DEPARTMENT_OTHER): Payer: Self-pay

## 2021-07-20 ENCOUNTER — Ambulatory Visit (INDEPENDENT_AMBULATORY_CARE_PROVIDER_SITE_OTHER): Payer: Medicare Other | Admitting: Nurse Practitioner

## 2021-07-20 VITALS — BP 130/100 | HR 94 | Ht 66.0 in | Wt 182.2 lb

## 2021-07-20 DIAGNOSIS — G43011 Migraine without aura, intractable, with status migrainosus: Secondary | ICD-10-CM

## 2021-07-20 DIAGNOSIS — R0981 Nasal congestion: Secondary | ICD-10-CM | POA: Diagnosis not present

## 2021-07-20 MED ORDER — AZITHROMYCIN 250 MG PO TABS: ORAL_TABLET | ORAL | 0 refills | Status: DC

## 2021-07-20 MED ORDER — DOXYCYCLINE MONOHYDRATE 100 MG PO CAPS: 100.0000 mg | ORAL_CAPSULE | Freq: Two times a day (BID) | ORAL | 0 refills | Status: DC

## 2021-07-20 MED ORDER — RIZATRIPTAN BENZOATE 10 MG PO TABS: 10.0000 mg | ORAL_TABLET | ORAL | 3 refills | Status: AC | PRN

## 2021-07-20 NOTE — Telephone Encounter (Signed)
I called and spoke with Lysbeth Galas at [facility] [phone], cancelled medication Azithromycin per [name] DNP

## 2021-07-20 NOTE — Progress Notes (Signed)
Acute Office Visit  Subjective:    Patient ID: Kendra Miles, female    DOB: [birth year], 70 y.o.   MRN: [id number]  Chief Complaint  Patient presents with   Facial Pain    Patient is present today for sinus pressure and extreme headache. Swollen glands x 3 days. I did under the direction of Sarabeth swab the patient for the flu and Covid.     HPI Patient is in today for sinus pain and pressure, congestion, and severe headache that has been present for about the past week. She endorses sensitivity to light with the headache. She has not had any fever or chills, body aches, or sore throat.  She has had a cough, but not productive. She had mild nausea yesterday which was relieved with zofran.  She has not had vomiting or diarrhea.  She has tried sinus medication OTC, which does relieve the symptoms mildly, but does not take them away.    Past Medical History:  Diagnosis Date   Acid reflux    Arthritis    Cholelithiasis with choledocholithiasis 02/2019   Diabetes ([place])    Headache    Hiatal hernia    Hyperlipidemia    Pneumonia    Polyp of stomach and duodenum     Past Surgical History:  Procedure Laterality Date   ABDOMINAL HYSTERECTOMY     partial   APPENDECTOMY     BIOPSY  03/08/2019   Procedure: BIOPSY;  Surgeon: Gatha Mayer, MD;  Location: [facility];  Service: Endoscopy;;   CHOLECYSTECTOMY N/A 03/09/2019   Procedure: laparoscopic cholecystectomy;  Surgeon: Jovita Kussmaul, MD;  Location: [facility];  Service: General;  Laterality: N/A;   CYST REMOVAL TRUNK     ERCP N/A 03/08/2019   Procedure: ENDOSCOPIC RETROGRADE CHOLANGIOPANCREATOGRAPHY (ERCP);  Surgeon: Gatha Mayer, MD;  Location: [facility];  Service: Endoscopy;  Laterality: N/A;   ESOPHAGOGASTRODUODENOSCOPY (EGD) WITH PROPOFOL N/A 04/24/2019   Procedure: ESOPHAGOGASTRODUODENOSCOPY (EGD) WITH PROPOFOL;  Surgeon: Ronnette Juniper, MD;  Location: [facility];  Service: Gastroenterology;  Laterality: N/A;    HEMOSTASIS CLIP PLACEMENT  04/24/2019   Procedure: HEMOSTASIS CLIP PLACEMENT;  Surgeon: Ronnette Juniper, MD;  Location: [facility];  Service: Gastroenterology;;   LAPAROSCOPIC NISSEN FUNDOPLICATION N/A 26/41/5830   Procedure: LAPAROSCOPIC HIATAL HERNIA REPAIR;  Surgeon: Felicie Morn, MD;  Location: [facility];  Service: General;  Laterality: N/A;   PANCREATIC STENT PLACEMENT  03/08/2019   Procedure: PANCREATIC STENT PLACEMENT;  Surgeon: Gatha Mayer, MD;  Location: [facility];  Service: Endoscopy;;   POLYPECTOMY  04/24/2019   Procedure: POLYPECTOMY;  Surgeon: Ronnette Juniper, MD;  Location: [facility];  Service: Gastroenterology;;   REMOVAL OF STONES  03/08/2019   Procedure: REMOVAL OF STONES;  Surgeon: Gatha Mayer, MD;  Location: [facility];  Service: Endoscopy;;   SPHINCTEROTOMY  03/08/2019   Procedure: Joan Mayans;  Surgeon: Gatha Mayer, MD;  Location: [facility];  Service: Endoscopy;;   UPPER GI ENDOSCOPY  06/13/2021   Procedure: UPPER GI ENDOSCOPY;  Surgeon: Felicie Morn, MD;  Location: [facility];  Service: General;;    Family History  Problem Relation Age of Onset   Memory loss Mother    COPD Father     Social History   Socioeconomic History   Marital status: Widowed    Spouse name: Not on file   Number of children: 4   Years of education: Not on file   Highest education level: Not on file  Occupational  History   Occupation: Surveyor, quantity: [employer]    Comment: 27 years  Tobacco Use   Smoking status: Never   Smokeless tobacco: Never  Vaping Use   Vaping Use: Never used  Substance and Sexual Activity   Alcohol use: No   Drug use: Never   Sexual activity: Not Currently  Other Topics Concern   Not on file  Social History Narrative   Lives alone.    4 adult children, 3 sons and 1 daughter. One son lives in the area, other children live in [place] with 9 grandchildren and 2 great grandchildren +1 on the way. She visits them every year.    Mother  (42) and Father (49) both alive and live in [place].       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    Social Determinants of Health   Financial Resource Strain: Not on file  Food Insecurity: Not on file  Transportation Needs: No Transportation Needs   Lack of Transportation (Medical): No   Lack of Transportation (Non-Medical): No  Physical Activity: Insufficiently Active   Days of Exercise per Week: 2 days   Minutes of Exercise per Session: 10 min  Stress: Not on file  Social Connections: Not on file  Intimate Partner Violence: Not on file    Outpatient Medications Prior to Visit  Medication Sig Dispense Refill   aspirin 81 MG chewable tablet Chew 1 tablet (81 mg total) by mouth daily. (Patient not taking: No sig reported) 90 tablet 1   blood glucose meter kit and supplies Testing at least once per day (morning fasting), with option to test up to 4 times per day (morning fasting and as needed for symptoms) as needed for better control. Use only the strips that come in this kit with this meter- other strips will not work properly. 1 each 99   calcium carbonate (OSCAL) 1500 (600 Ca) MG TABS tablet Take 0.4 tablets (600 mg total) by mouth daily. (Patient not taking: Reported on 06/10/2021) 90 tablet 1   emollient lotion Apply topically 2 (two) times daily as needed. Apply to legs as needed for itching and dryness. (Patient not taking: Reported on 06/10/2021) 240 mL 1   glipiZIDE (GLUCOTROL) 5 MG tablet Take 1 tablet (5 mg total) by mouth 2 (two) times daily before a meal. 180 tablet 1   lisinopril (ZESTRIL) 10 MG tablet Take 1 tablet (10 mg total) by mouth daily. 90 tablet 1   lisinopril (ZESTRIL) 5 MG tablet Take 5 mg by mouth daily.     loratadine (CLARITIN) 10 MG tablet Take 1 tablet (10 mg total) by mouth daily. 90 tablet 3   meloxicam (MOBIC) 7.5 MG tablet Take 1 tablet (7.5 mg total) by mouth daily. 30 tablet 1    metFORMIN (GLUCOPHAGE) 500 MG tablet Take 1 tablet (500 mg total) by mouth 2 (two) times daily with a meal. (Patient not taking: No sig reported) 180 tablet 1   Multiple Vitamin (MULTIVITAMIN WITH MINERALS) TABS tablet Take 1 tablet by mouth daily. Women's 50+ Multivitamin     Omega-3 Fatty Acids (FISH OIL PO) Take 2,000 mg by mouth daily.     ondansetron (ZOFRAN) 4 MG tablet Take 1 tablet (4 mg total) by mouth every 8 (eight) hours as needed for nausea or vomiting. 30 tablet 3   oxyCODONE-acetaminophen (PERCOCET) 5-325 MG tablet Take 1 tablet by mouth every  4 (four) hours as needed for severe pain. 15 tablet 0   pantoprazole (PROTONIX) 40 MG tablet TAKE 1 TABLET BY MOUTH TWICE A DAY. AFTER FIRST 30 DAYS,DECREASE TO ONCE DAILY OR AS DIRECTED BY GI 180 tablet 1   PARoxetine (PAXIL) 30 MG tablet Take 1 tablet (30 mg total) by mouth daily. 90 tablet 3   rosuvastatin (CRESTOR) 10 MG tablet Take 1 tablet (10 mg total) by mouth daily. (Patient not taking: Reported on 06/10/2021) 90 tablet 3   rosuvastatin (CRESTOR) 5 MG tablet Take 5 mg by mouth daily.     No facility-administered medications prior to visit.    Allergies  Allergen Reactions   Ciprofloxacin Other (See Comments)    Stomach cramps    Guaifenesin Er Other (See Comments)    Mucinex - keeps pt awake all night    Penicillins Palpitations    Did it involve swelling of the face/tongue/throat, SOB, or low BP? No Did it involve sudden or severe rash/hives, skin peeling, or any reaction on the inside of your mouth or nose? No Did you need to seek medical attention at a hospital or doctor's office? No When did it last happen? [phone]     If all above answers are "NO", may proceed with cephalosporin use.     Review of Systems All review of systems negative except what is listed in the HPI     Objective:    Physical Exam Vitals and nursing note reviewed.  Constitutional:      Appearance: She is ill-appearing.  HENT:     Head:  Normocephalic.     Nose: Congestion present.     Mouth/Throat:     Mouth: Mucous membranes are moist.     Pharynx: Oropharynx is clear. Posterior oropharyngeal erythema present.  Eyes:     Extraocular Movements: Extraocular movements intact.     Conjunctiva/sclera: Conjunctivae normal.     Pupils: Pupils are equal, round, and reactive to light.  Neck:     Vascular: No carotid bruit.  Cardiovascular:     Rate and Rhythm: Normal rate and regular rhythm.     Pulses: Normal pulses.     Heart sounds: Normal heart sounds. No murmur heard. Pulmonary:     Effort: Pulmonary effort is normal.     Comments: Course lung sounds in the bases bilaterally.  Abdominal:     General: Bowel sounds are normal.     Palpations: Abdomen is soft.  Musculoskeletal:     Cervical back: Normal range of motion. No tenderness.     Right lower leg: No edema.     Left lower leg: No edema.  Lymphadenopathy:     Cervical: Cervical adenopathy present.  Skin:    General: Skin is warm and dry.     Capillary Refill: Capillary refill takes less than 2 seconds.  Neurological:     General: No focal deficit present.     Mental Status: She is alert and oriented to person, place, and time.  Psychiatric:        Mood and Affect: Mood normal.        Behavior: Behavior normal.        Thought Content: Thought content normal.        Judgment: Judgment normal.    BP (!) 130/100   Pulse 94   Ht '5\' 6"'  (1.676 m)   Wt 182 lb 3.2 oz (82.6 kg)   SpO2 (!) 70%   BMI 29.41 kg/m  Wt Readings  from Last 3 Encounters:  07/20/21 182 lb 3.2 oz (82.6 kg)  06/13/21 176 lb 5.9 oz (80 kg)  06/10/21 190 lb (86.2 kg)    Health Maintenance Due  Topic Date Due   Pneumonia Vaccine 19+ Years old (1 - PCV) Never done   OPHTHALMOLOGY EXAM  Never done   Zoster Vaccines- Shingrix (1 of 2) Never done   COLONOSCOPY (Pts 45-27yr Insurance coverage will need to be confirmed)  Never done   DEXA SCAN  Never done   COVID-19 Vaccine (3 -  Pfizer risk series) 01/12/2020   INFLUENZA VACCINE  03/21/2021    There are no preventive care reminders to display for this patient.   Lab Results  Component Value Date   TSH 1.580 11/02/2020   Lab Results  Component Value Date   WBC 10.9 (H) 06/15/2021   HGB 13.8 06/15/2021   HCT 42.4 06/15/2021   MCV 92.2 06/15/2021   PLT 229 06/15/2021   Lab Results  Component Value Date   NA 140 06/15/2021   K 3.5 06/15/2021   CO2 30 06/15/2021   GLUCOSE 146 (H) 06/15/2021   BUN 13 06/15/2021   CREATININE 0.68 06/15/2021   BILITOT 1.6 (H) 06/10/2021   ALKPHOS 97 06/10/2021   AST 24 06/10/2021   ALT 18 06/10/2021   PROT 8.5 (H) 06/10/2021   ALBUMIN 5.0 06/10/2021   CALCIUM 7.6 (L) 06/15/2021   ANIONGAP 5 06/15/2021   EGFR 79 05/06/2021   Lab Results  Component Value Date   CHOL 174 11/02/2020   Lab Results  Component Value Date   HDL 37 (L) 11/02/2020   Lab Results  Component Value Date   LDLCALC 99 11/02/2020   Lab Results  Component Value Date   TRIG 182 (H) 03/08/2021   Lab Results  Component Value Date   CHOLHDL 4.7 11/02/2020   Lab Results  Component Value Date   HGBA1C 9.9 (A) 05/06/2021       Assessment & Plan:   Problem List Items Addressed This Visit   None Visit Diagnoses     Intractable migraine without aura and with status migrainosus    -  Primary   Relevant Medications   rizatriptan (MAXALT) 10 MG tablet   Congestion of paranasal sinus       Relevant Medications   doxycycline (MONODOX) 100 MG capsule   Other Relevant Orders   COVID-19, Flu A+B and RSV      Swab for COVID, Flu, and RSV in the office today.  POC flu negative at this time.  Will treat as acute sinusitis given the symptoms and presentation. Will also treat for migraines.  Patient declines migraine cocktail in office today. Patient historically does not have results with azithromycin, therefore will change to doxycycline.  F/U if symptoms worsen or fail to improve.   Low concerns for COVID. Will send remaining results to patient when received.   Meds ordered this encounter  Medications   DISCONTD: azithromycin (ZITHROMAX) 250 MG tablet    Sig: Take 2 tablets on day 1, then 1 tablet daily on days 2 through 5    Dispense:  6 tablet    Refill:  0   rizatriptan (MAXALT) 10 MG tablet    Sig: Take 1 tablet (10 mg total) by mouth as needed for migraine. May repeat in 2 hours if needed. Do not take more than 2 doses in 24 hours.    Dispense:  6 tablet    Refill:  3  doxycycline (MONODOX) 100 MG capsule    Sig: Take 1 capsule (100 mg total) by mouth 2 (two) times daily.    Dispense:  14 capsule    Refill:  0     Orma Render, NP

## 2021-07-20 NOTE — Patient Instructions (Signed)
We have swabbed you for COVID and the flu. Your flu test was negative- we are waiting on COVID results.   Given the symptoms, we will begin treatment today with azithromycin and I have sent in rizatriptan for migraine headaches.   Be sure to rest and hydrate well. You may continue to take sinus medication for your symptoms.   If your symptoms get worse or do not improve, please let me know. We will let you know if the COVID results come back as positive, but I feel that this is less likely given your current symptoms.

## 2021-07-21 ENCOUNTER — Ambulatory Visit (HOSPITAL_BASED_OUTPATIENT_CLINIC_OR_DEPARTMENT_OTHER): Payer: Medicare Other | Admitting: Nurse Practitioner

## 2021-07-22 LAB — COVID-19, FLU A+B AND RSV
Influenza A, NAA: NOT DETECTED
Influenza B, NAA: NOT DETECTED
RSV, NAA: NOT DETECTED
SARS-CoV-2, NAA: NOT DETECTED

## 2021-08-03 ENCOUNTER — Other Ambulatory Visit (HOSPITAL_BASED_OUTPATIENT_CLINIC_OR_DEPARTMENT_OTHER): Payer: Self-pay | Admitting: Nurse Practitioner

## 2021-08-03 DIAGNOSIS — M25552 Pain in left hip: Secondary | ICD-10-CM

## 2021-08-05 ENCOUNTER — Other Ambulatory Visit: Payer: Self-pay

## 2021-08-05 ENCOUNTER — Ambulatory Visit (INDEPENDENT_AMBULATORY_CARE_PROVIDER_SITE_OTHER): Payer: Medicare Other | Admitting: Nurse Practitioner

## 2021-08-05 ENCOUNTER — Encounter (HOSPITAL_BASED_OUTPATIENT_CLINIC_OR_DEPARTMENT_OTHER): Payer: Self-pay | Admitting: Nurse Practitioner

## 2021-08-05 VITALS — BP 122/82 | HR 83 | Ht 66.0 in | Wt 187.0 lb

## 2021-08-05 DIAGNOSIS — M5442 Lumbago with sciatica, left side: Secondary | ICD-10-CM | POA: Diagnosis not present

## 2021-08-05 DIAGNOSIS — M5441 Lumbago with sciatica, right side: Secondary | ICD-10-CM

## 2021-08-05 DIAGNOSIS — Z794 Long term (current) use of insulin: Secondary | ICD-10-CM

## 2021-08-05 DIAGNOSIS — E1159 Type 2 diabetes mellitus with other circulatory complications: Secondary | ICD-10-CM

## 2021-08-05 DIAGNOSIS — I152 Hypertension secondary to endocrine disorders: Secondary | ICD-10-CM | POA: Diagnosis not present

## 2021-08-05 DIAGNOSIS — G8929 Other chronic pain: Secondary | ICD-10-CM

## 2021-08-05 DIAGNOSIS — E1165 Type 2 diabetes mellitus with hyperglycemia: Secondary | ICD-10-CM

## 2021-08-05 MED ORDER — MELOXICAM 7.5 MG PO TABS: 7.5000 mg | ORAL_TABLET | Freq: Every day | ORAL | 6 refills | Status: DC

## 2021-08-05 NOTE — Progress Notes (Signed)
Established Patient Office Visit  Subjective:  Patient ID: Kendra Miles, female    DOB: 12/08/1950  Age: 70 y.o. MRN: [id number]  CC:  Chief Complaint  Patient presents with   Follow-up    Patient is here today for follow up DM and HTN. She states she is feeling good. She has been checking her blood sugar readings are between 150-225. Patient states she needs a refill on Mobic.     HPI [name] presents for f/u for DM and HTN  She is currently taking glipizide 33m twice a day She reports that her at home blood sugars are between 150-225 She endorses that her diet is not as good as it should be and she is not monitoring her carbohydrate intake She tells me that she stopped the metformin when she started the glipizide She is having no dizziness, shortness of breath, increased hunger/thirst/urination, no infections or wounds.  HTN Currently taking 181mlisinopril Not checking her BP at home She is active at work, but no routine PE She is not having dizziness, CP, ShoB, or LE edema.   Past Medical History:  Diagnosis Date   Acid reflux    Arthritis    Cholelithiasis with choledocholithiasis 02/2019   Diabetes ([facility]   Headache    Hiatal hernia    Hyperlipidemia    Pneumonia    Polyp of stomach and duodenum     Past Surgical History:  Procedure Laterality Date   ABDOMINAL HYSTERECTOMY     partial   APPENDECTOMY     BIOPSY  03/08/2019   Procedure: BIOPSY;  Surgeon: GeGatha MayerMD;  [facility]: [facility];  Service: Endoscopy;;   CHOLECYSTECTOMY N/A 03/09/2019   Procedure: laparoscopic cholecystectomy;  Surgeon: ToJovita KussmaulMD;  Location: [facility];  Service: General;  Laterality: N/A;   CYST REMOVAL TRUNK     ERCP N/A 03/08/2019   Procedure: ENDOSCOPIC RETROGRADE CHOLANGIOPANCREATOGRAPHY (ERCP);  Surgeon: GeGatha MayerMD;  [facility];  Service: Endoscopy;  Laterality: N/A;   ESOPHAGOGASTRODUODENOSCOPY (EGD) WITH PROPOFOL N/A 04/24/2019    Procedure: ESOPHAGOGASTRODUODENOSCOPY (EGD) WITH PROPOFOL;  Surgeon: KaRonnette JuniperMD;  [facility];  Service: Gastroenterology;  Laterality: N/A;   HEMOSTASIS CLIP PLACEMENT  04/24/2019   Procedure: HEMOSTASIS CLIP PLACEMENT;  Surgeon: KaRonnette JuniperMD;  [facility];  Service: Gastroenterology;;   LAPAROSCOPIC NISSEN FUNDOPLICATION N/A [id number] Procedure: LAPAROSCOPIC HIATAL HERNIA REPAIR;  Surgeon: StFelicie MornMD;  [facility];  Service: General;  Laterality: N/A;   PANCREATIC STENT PLACEMENT  03/08/2019   Procedure: PANCREATIC STENT PLACEMENT;  Surgeon: GeGatha MayerMD;  [facility];  Service: Endoscopy;;   POLYPECTOMY  04/24/2019   Procedure: POLYPECTOMY;  Surgeon: KaRonnette JuniperMD;  [facility];  Service: Gastroenterology;;   REMOVAL OF STONES  03/08/2019   Procedure: REMOVAL OF STONES;  Surgeon: GeGatha MayerMD;  [facility];  Service: Endoscopy;;   SPHINCTEROTOMY  03/08/2019   Procedure: SPJoan Mayans Surgeon: GeGatha MayerMD;  [facility];  Service: Endoscopy;;   UPPER GI ENDOSCOPY  06/13/2021   Procedure: UPPER GI ENDOSCOPY;  Surgeon: StFelicie MornMD;  [facility];  Service: General;;    Family History  Problem Relation Age of Onset   Memory loss Mother    COPD Father     Social History   Socioeconomic History   Marital status: Widowed    Spouse name: Not on  file   Number of children: 4   Years of education: Not on file   Highest education level: Not on file  Occupational History   Occupation: Surveyor, quantity: [employer]    Comment: 27 years  Tobacco Use   Smoking status: Never   Smokeless tobacco: Never  Vaping Use   Vaping Use: Never used  Substance and Sexual Activity   Alcohol use: No   Drug use: Never   Sexual activity: Not Currently  Other Topics Concern   Not on file  Social History Narrative   Lives alone.    4 adult children, 3 sons and 1  daughter. One son lives in the area, other children live in [place] with 9 grandchildren and 2 great grandchildren +1 on the way. She visits them every year.    Mother (62) and Father (43) both alive and live in [place].       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    Social Determinants of Health   Financial Resource Strain: Not on file  Food Insecurity: Not on file  Transportation Needs: No Transportation Needs   Lack of Transportation (Medical): No   Lack of Transportation (Non-Medical): No  Physical Activity: Insufficiently Active   Days of Exercise per Week: 2 days   Minutes of Exercise per Session: 10 min  Stress: Not on file  Social Connections: Not on file  Intimate Partner Violence: Not on file    Outpatient Medications Prior to Visit  Medication Sig Dispense Refill   aspirin 81 MG chewable tablet Chew 1 tablet (81 mg total) by mouth daily. (Patient not taking: No sig reported) 90 tablet 1   blood glucose meter kit and supplies Testing at least once per day (morning fasting), with option to test up to 4 times per day (morning fasting and as needed for symptoms) as needed for better control. Use only the strips that come in this kit with this meter- other strips will not work properly. 1 each 99   calcium carbonate (OSCAL) 1500 (600 Ca) MG TABS tablet Take 0.4 tablets (600 mg total) by mouth daily. (Patient not taking: Reported on 06/10/2021) 90 tablet 1   doxycycline (MONODOX) 100 MG capsule Take 1 capsule (100 mg total) by mouth 2 (two) times daily. 14 capsule 0   emollient lotion Apply topically 2 (two) times daily as needed. Apply to legs as needed for itching and dryness. (Patient not taking: Reported on 06/10/2021) 240 mL 1   glipiZIDE (GLUCOTROL) 5 MG tablet Take 1 tablet (5 mg total) by mouth 2 (two) times daily before a meal. 180 tablet 1   lisinopril (ZESTRIL) 10 MG tablet Take 1 tablet (10 mg total) by  mouth daily. 90 tablet 1   lisinopril (ZESTRIL) 5 MG tablet Take 5 mg by mouth daily.     loratadine (CLARITIN) 10 MG tablet Take 1 tablet (10 mg total) by mouth daily. 90 tablet 3   metFORMIN (GLUCOPHAGE) 500 MG tablet Take 1 tablet (500 mg total) by mouth 2 (two) times daily with a meal. (Patient not taking: No sig reported) 180 tablet 1   Multiple Vitamin (MULTIVITAMIN WITH MINERALS) TABS tablet Take 1 tablet by mouth daily. Women's 50+ Multivitamin     Omega-3 Fatty Acids (FISH OIL PO) Take 2,000 mg by mouth daily.     ondansetron (ZOFRAN) 4 MG tablet Take 1 tablet (4 mg total)  by mouth every 8 (eight) hours as needed for nausea or vomiting. 30 tablet 3   oxyCODONE-acetaminophen (PERCOCET) 5-325 MG tablet Take 1 tablet by mouth every 4 (four) hours as needed for severe pain. 15 tablet 0   pantoprazole (PROTONIX) 40 MG tablet TAKE 1 TABLET BY MOUTH TWICE A DAY. AFTER FIRST 30 DAYS,DECREASE TO ONCE DAILY OR AS DIRECTED BY GI 180 tablet 1   PARoxetine (PAXIL) 30 MG tablet Take 1 tablet (30 mg total) by mouth daily. 90 tablet 3   rizatriptan (MAXALT) 10 MG tablet Take 1 tablet (10 mg total) by mouth as needed for migraine. May repeat in 2 hours if needed. Do not take more than 2 doses in 24 hours. 6 tablet 3   rosuvastatin (CRESTOR) 10 MG tablet Take 1 tablet (10 mg total) by mouth daily. (Patient not taking: Reported on 06/10/2021) 90 tablet 3   rosuvastatin (CRESTOR) 5 MG tablet Take 5 mg by mouth daily.     meloxicam (MOBIC) 7.5 MG tablet Take 1 tablet (7.5 mg total) by mouth daily. 30 tablet 1   No facility-administered medications prior to visit.    Allergies  Allergen Reactions   Ciprofloxacin Other (See Comments)    Stomach cramps    Guaifenesin Er Other (See Comments)    Mucinex - keeps pt awake all night    Penicillins Palpitations    Did it involve swelling of the face/tongue/throat, SOB, or low BP? No Did it involve sudden or severe rash/hives, skin peeling, or any reaction on  the inside of your mouth or nose? No Did you need to seek medical attention at a hospital or doctor's office? No When did it last happen? [phone]     If all above answers are NO, may proceed with cephalosporin use.     ROS Review of Systems All review of systems negative except what is listed in the HPI    Objective:    Physical Exam Vitals and nursing note reviewed.  Constitutional:      Appearance: Normal appearance.  HENT:     Head: Normocephalic.  Eyes:     Extraocular Movements: Extraocular movements intact.     Conjunctiva/sclera: Conjunctivae normal.     Pupils: Pupils are equal, round, and reactive to light.  Neck:     Vascular: No carotid bruit.  Cardiovascular:     Rate and Rhythm: Normal rate and regular rhythm.     Pulses: Normal pulses.     Heart sounds: Normal heart sounds.  Pulmonary:     Effort: Pulmonary effort is normal.     Breath sounds: Normal breath sounds.  Musculoskeletal:     Cervical back: Normal range of motion.     Right lower leg: No edema.     Left lower leg: No edema.  Skin:    General: Skin is warm and dry.     Capillary Refill: Capillary refill takes less than 2 seconds.  Neurological:     General: No focal deficit present.     Mental Status: She is alert and oriented to person, place, and time.  Psychiatric:        Mood and Affect: Mood normal.        Behavior: Behavior normal.        Thought Content: Thought content normal.        Judgment: Judgment normal.    BP 122/82    Pulse 83    Ht '5\' 6"'  (1.676 m)    Wt 187  lb (84.8 kg)    SpO2 94%    BMI 30.18 kg/m  Wt Readings from Last 3 Encounters:  08/05/21 187 lb (84.8 kg)  07/20/21 182 lb 3.2 oz (82.6 kg)  06/13/21 176 lb 5.9 oz (80 kg)     Health Maintenance Due  Topic Date Due   Pneumonia Vaccine 37+ Years old (1 - PCV) Never done   OPHTHALMOLOGY EXAM  Never done   Zoster Vaccines- Shingrix (1 of 2) Never done   COLONOSCOPY (Pts 45-53yr Insurance coverage will need  to be confirmed)  Never done   DEXA SCAN  Never done   COVID-19 Vaccine (3 - Pfizer risk series) 01/12/2020   INFLUENZA VACCINE  03/21/2021    There are no preventive care reminders to display for this patient.  Lab Results  Component Value Date   TSH 1.580 11/02/2020   Lab Results  Component Value Date   WBC 10.9 (H) 06/15/2021   HGB 13.8 06/15/2021   HCT 42.4 06/15/2021   MCV 92.2 06/15/2021   PLT 229 06/15/2021   Lab Results  Component Value Date   NA 140 06/15/2021   K 3.5 06/15/2021   CO2 30 06/15/2021   GLUCOSE 146 (H) 06/15/2021   BUN 13 06/15/2021   CREATININE 0.68 06/15/2021   BILITOT 1.6 (H) 06/10/2021   ALKPHOS 97 06/10/2021   AST 24 06/10/2021   ALT 18 06/10/2021   PROT 8.5 (H) 06/10/2021   ALBUMIN 5.0 06/10/2021   CALCIUM 7.6 (L) 06/15/2021   ANIONGAP 5 06/15/2021   EGFR 79 05/06/2021   Lab Results  Component Value Date   CHOL 174 11/02/2020   Lab Results  Component Value Date   HDL 37 (L) 11/02/2020   Lab Results  Component Value Date   LDLCALC 99 11/02/2020   Lab Results  Component Value Date   TRIG 182 (H) 03/08/2021   Lab Results  Component Value Date   CHOLHDL 4.7 11/02/2020   Lab Results  Component Value Date   HGBA1C 9.9 (A) 05/06/2021      Assessment & Plan:   Problem List Items Addressed This Visit     [place] with diabetes [place] [facility]    Blood pressure excellent today after increase in medication dose at last visit.  Will wait to get labs at next visit since she just recently had labs with her surgery.  No alarm symptoms present today Recommend low sodium, low carbohydrate, low saturated fat diet with increased protein snack at bedtime.  Will follow-up in 3 months or sooner if needed and plan to get labs at that visit.       Type 2 diabetes mellitus with hyperglycemia, with long-term current use of insulin (HCC)    Blood sugars not at goal, but appear to be improved from last visit.  Recommend  restart metformin AND continue glipizide to help with blood sugar control Recommend increase protein intake at bedtime, as I am concerned that her elevated readings in the morning are possibly due to dawn phenomena.  We will not check A1c today due to recent hospitalization/surgery and not currently on medication needed for control, but will plan to recheck in 3 months.       Relevant Orders   POCT glycosylated hemoglobin (Hb A1C)   Back pain    Doing well on PRN mobic.  Refill provided today      Relevant Medications   meloxicam (MOBIC) 7.5 MG tablet    Meds ordered this encounter  Medications   meloxicam (MOBIC) 7.5 MG tablet    Sig: Take 1 tablet (7.5 mg total) by mouth daily.    Dispense:  30 tablet    Refill:  6    Follow-up: Return in about 3 months (around 11/03/2021).    Orma Render, NP

## 2021-08-05 NOTE — Telephone Encounter (Signed)
Patient had refill authorized during follow up visit in office

## 2021-08-05 NOTE — Assessment & Plan Note (Signed)
Blood pressure excellent today after increase in medication dose at last visit.  Will wait to get labs at next visit since she just recently had labs with her surgery.  No alarm symptoms present today Recommend low sodium, low carbohydrate, low saturated fat diet with increased protein snack at bedtime.  Will follow-up in 3 months or sooner if needed and plan to get labs at that visit.

## 2021-08-05 NOTE — Patient Instructions (Addendum)
Keep checking your blood sugars. We really want to get them less than 120 when you wake up in the morning for best control.   Be sure to watch your carbohydrate intake and keep it less than 150 grams per day.   Restart the metformin and continue to take the Glipizide. Take one tablet a day for one week the increase to the one tablet with breakfast and one tablet with dinner.

## 2021-08-05 NOTE — Assessment & Plan Note (Signed)
Doing well on PRN mobic.  Refill provided today

## 2021-08-05 NOTE — Assessment & Plan Note (Signed)
Blood sugars not at goal, but appear to be improved from last visit.  Recommend restart metformin AND continue glipizide to help with blood sugar control Recommend increase protein intake at bedtime, as I am concerned that her elevated readings in the morning are possibly due to dawn phenomena.  We will not check A1c today due to recent hospitalization/surgery and not currently on medication needed for control, but will plan to recheck in 3 months.

## 2021-08-23 ENCOUNTER — Other Ambulatory Visit: Payer: Self-pay

## 2021-08-23 ENCOUNTER — Encounter (HOSPITAL_BASED_OUTPATIENT_CLINIC_OR_DEPARTMENT_OTHER): Payer: Self-pay | Admitting: Family Medicine

## 2021-08-23 ENCOUNTER — Ambulatory Visit (INDEPENDENT_AMBULATORY_CARE_PROVIDER_SITE_OTHER): Payer: Medicare Other | Admitting: Family Medicine

## 2021-08-23 DIAGNOSIS — J069 Acute upper respiratory infection, unspecified: Secondary | ICD-10-CM | POA: Diagnosis not present

## 2021-08-23 HISTORY — DX: Acute upper respiratory infection, unspecified: J06.9

## 2021-08-23 MED ORDER — BENZONATATE 100 MG PO CAPS: 100.0000 mg | ORAL_CAPSULE | Freq: Three times a day (TID) | ORAL | 0 refills | Status: DC | PRN

## 2021-08-23 MED ORDER — FLUTICASONE PROPIONATE 50 MCG/ACT NA SUSP: 2.0000 | Freq: Every day | NASAL | 0 refills | Status: DC

## 2021-08-23 NOTE — Progress Notes (Signed)
° °  Virtual Visit via Telephone   I connected with  Kendra Miles  on 08/23/21 by telephone/telehealth and verified that I am speaking with the correct person using two identifiers.   I discussed the limitations, risks, security and privacy concerns of performing an evaluation and management service by telephone, including the higher likelihood of inaccurate diagnosis and treatment, and the availability of in person appointments.  We also discussed the likely need of an additional face to face encounter for complete and high quality delivery of care.  I also discussed with the patient that there may be a patient responsible charge related to this service. The patient expressed understanding and wishes to proceed.  Provider location is in medical facility. Patient location is at their home, different from provider location. People involved in care of the patient during this telehealth encounter were myself, my nurse/medical assistant, and my front office/scheduling team member.  Review of Systems: No fevers, chills, night sweats, weight loss, chest pain, or shortness of breath.   Objective Findings:    General: Speaking full sentences, no audible heavy breathing.  Sounds alert and appropriately interactive.    Independent interpretation of tests performed by another provider:   None.  Brief History, Exam, Impression, and Recommendations:    Viral URI 71 year old female with recent illness Indicates that symptoms began about 5 days ago including sore throat, cough, sneezing, sinus congestion.  She has also had some sweats, denies any fevers Does endorse sick contact at work who had similar symptoms and missed a few days of work; not sure of any specific diagnosed infection for sick contact. She has tried Alka-Seltzer, Zicam, Mucinex without significant relief of symptoms Discussed options with patient, will proceed with use of Tessalon Perles, Flonase, nasal saline spray.  Prescriptions  sent for Tessalon Perles and Flonase Given patient's risk factors, also recommend testing for coronavirus which can be completed with at-home test or at local pharmacy.  She will let us know regarding results of testing and if positive, then may consider additional disease specific treatment Plan for follow-up as needed, particularly if symptoms are not improving as the week progresses or if there is any worsening in symptoms such as trouble breathing, shortness of breath  I discussed the above assessment and treatment plan with the patient. The patient was provided an opportunity to ask questions and all were answered. The patient agreed with the plan and demonstrated an understanding of the instructions.   The patient was advised to call back or seek an in-person evaluation if the symptoms worsen or if the condition fails to improve as anticipated.   I provided 10 minutes of face to face and non-face-to-face time during this encounter date, time was needed to gather information, review chart, records, communicate/coordinate with staff remotely, as well as complete documentation.   ___________________________________________ Zakhia Seres de [place], MD, ABFM, CAQSM Primary Care and [place]

## 2021-08-23 NOTE — Assessment & Plan Note (Signed)
71 year old female with recent illness Indicates that symptoms began about 5 days ago including sore throat, cough, sneezing, sinus congestion.  She has also had some sweats, denies any fevers Does endorse sick contact at work who had similar symptoms and missed a few days of work; not sure of any specific diagnosed infection for sick contact. She has tried Alka-Seltzer, Zicam, Mucinex without significant relief of symptoms Discussed options with patient, will proceed with use of Tessalon Perles, Flonase, nasal saline spray.  Prescriptions sent for Tessalon Perles and Flonase Given patient's risk factors, also recommend testing for coronavirus which can be completed with at-home test or at local pharmacy.  She will let us know regarding results of testing and if positive, then may consider additional disease specific treatment Plan for follow-up as needed, particularly if symptoms are not improving as the week progresses or if there is any worsening in symptoms such as trouble breathing, shortness of breath

## 2021-08-25 ENCOUNTER — Encounter (HOSPITAL_BASED_OUTPATIENT_CLINIC_OR_DEPARTMENT_OTHER): Payer: Self-pay | Admitting: Nurse Practitioner

## 2021-08-26 DIAGNOSIS — Z20822 Contact with and (suspected) exposure to covid-19: Secondary | ICD-10-CM | POA: Diagnosis not present

## 2021-08-26 DIAGNOSIS — R0981 Nasal congestion: Secondary | ICD-10-CM | POA: Diagnosis not present

## 2021-08-26 DIAGNOSIS — R6884 Jaw pain: Secondary | ICD-10-CM | POA: Diagnosis not present

## 2021-08-26 DIAGNOSIS — R051 Acute cough: Secondary | ICD-10-CM | POA: Diagnosis not present

## 2021-08-26 DIAGNOSIS — J069 Acute upper respiratory infection, unspecified: Secondary | ICD-10-CM | POA: Diagnosis not present

## 2021-08-30 ENCOUNTER — Encounter (HOSPITAL_BASED_OUTPATIENT_CLINIC_OR_DEPARTMENT_OTHER): Payer: Self-pay | Admitting: Family Medicine

## 2021-08-30 ENCOUNTER — Ambulatory Visit (INDEPENDENT_AMBULATORY_CARE_PROVIDER_SITE_OTHER): Payer: Medicare Other | Admitting: Family Medicine

## 2021-08-30 ENCOUNTER — Other Ambulatory Visit: Payer: Self-pay

## 2021-08-30 DIAGNOSIS — H6690 Otitis media, unspecified, unspecified ear: Secondary | ICD-10-CM | POA: Diagnosis not present

## 2021-08-30 HISTORY — DX: Otitis media, unspecified, unspecified ear: H66.90

## 2021-08-30 MED ORDER — DOXYCYCLINE MONOHYDRATE 100 MG PO CAPS: 100.0000 mg | ORAL_CAPSULE | Freq: Two times a day (BID) | ORAL | 0 refills | Status: AC

## 2021-08-30 NOTE — Assessment & Plan Note (Signed)
Thursday she began to notice left ear/face pain. Was seen at local UC and prescribed Keflex. She has been taking the antibiotics but has not noticed significant relief with this. Denies any fevers or chills, occasional sweats, describes as hot flashes Some sore throat on left side, feels swollen on left side of throat On exam, left tympanic membrane is intact with mild bulging, mild erythema at the margins.  There is pharyngeal erythema present, no significant unilateral swelling observed within the pharynx Concern that current antibiotic coverage is not ideal particularly given lack of response with treatment Will have patient discontinue Keflex and start doxycycline for better coverage Continue with conservative measures Plan for virtual follow-up at the end of the week to ensure improvement - if not improving, consider referral to ENT for further evaluation

## 2021-08-30 NOTE — Progress Notes (Signed)
° ° °  Procedures performed today:    None.  Independent interpretation of notes and tests performed by another provider:   None.  Brief History, Exam, Impression, and Recommendations:    BP 109/74    Pulse 97    Ht 5\' 6"  (1.676 m)    Wt 188 lb (85.3 kg)    SpO2 97%    BMI 30.34 kg/m   Otitis media Thursday she began to notice left ear/face pain. Was seen at local UC and prescribed Keflex. She has been taking the antibiotics but has not noticed significant relief with this. Denies any fevers or chills, occasional sweats, describes as hot flashes Some sore throat on left side, feels swollen on left side of throat On exam, left tympanic membrane is intact with mild bulging, mild erythema at the margins.  There is pharyngeal erythema present, no significant unilateral swelling observed within the pharynx Concern that current antibiotic coverage is not ideal particularly given lack of response with treatment Will have patient discontinue Keflex and start doxycycline for better coverage Continue with conservative measures Plan for virtual follow-up at the end of the week to ensure improvement - if not improving, consider referral to ENT for further evaluation   ___________________________________________ Clayton Bosserman de [place], MD, ABFM, CAQSM Primary [facility] and [place]

## 2021-08-30 NOTE — Patient Instructions (Signed)
°  Medication Instructions:  Your physician has recommended you make the following change in your medication:  -- START Doxycycline 100 mg - Take 1 tablet twice daily for 7 days --If you need a refill on any your medications before your next appointment, please call your pharmacy first. If no refills are authorized on file call the office.-- Follow-Up: Your next appointment:   Your physician recommends that you schedule a follow-up appointment on Friday (virtually) with Dr. de [place]  You will receive a text message or e-mail with a link to a survey about your care and experience with [place] today! We would greatly appreciate your feedback!   Thanks for letting us be apart of your health journey!!  Primary Care and Sports Medicine   Dr. Arlina Robes [place]   We encourage you to activate your patient portal called "MyChart".  Sign up information is provided on this After Visit Summary.  MyChart is used to connect with patients for Virtual Visits (Telemedicine).  Patients are able to view lab/test results, encounter notes, upcoming appointments, etc.  Non-urgent messages can be sent to your provider as well. To learn more about what you can do with MyChart, please visit --  [email or web address].

## 2021-09-02 ENCOUNTER — Ambulatory Visit (INDEPENDENT_AMBULATORY_CARE_PROVIDER_SITE_OTHER): Payer: Medicare Other | Admitting: Family Medicine

## 2021-09-02 ENCOUNTER — Encounter (HOSPITAL_BASED_OUTPATIENT_CLINIC_OR_DEPARTMENT_OTHER): Payer: Self-pay | Admitting: Family Medicine

## 2021-09-02 ENCOUNTER — Other Ambulatory Visit: Payer: Self-pay

## 2021-09-02 DIAGNOSIS — H6690 Otitis media, unspecified, unspecified ear: Secondary | ICD-10-CM

## 2021-09-14 ENCOUNTER — Other Ambulatory Visit (HOSPITAL_BASED_OUTPATIENT_CLINIC_OR_DEPARTMENT_OTHER): Payer: Self-pay | Admitting: Family Medicine

## 2021-09-27 ENCOUNTER — Encounter (HOSPITAL_BASED_OUTPATIENT_CLINIC_OR_DEPARTMENT_OTHER): Payer: Self-pay | Admitting: Nurse Practitioner

## 2021-10-19 ENCOUNTER — Encounter (HOSPITAL_BASED_OUTPATIENT_CLINIC_OR_DEPARTMENT_OTHER): Payer: Self-pay | Admitting: Nurse Practitioner

## 2021-10-20 ENCOUNTER — Other Ambulatory Visit (HOSPITAL_BASED_OUTPATIENT_CLINIC_OR_DEPARTMENT_OTHER): Payer: Self-pay | Admitting: Nurse Practitioner

## 2021-10-20 DIAGNOSIS — K219 Gastro-esophageal reflux disease without esophagitis: Secondary | ICD-10-CM

## 2021-10-20 MED ORDER — PANTOPRAZOLE SODIUM 40 MG PO TBEC: 40.0000 mg | DELAYED_RELEASE_TABLET | Freq: Every day | ORAL | 3 refills | Status: DC

## 2021-10-21 ENCOUNTER — Encounter (HOSPITAL_BASED_OUTPATIENT_CLINIC_OR_DEPARTMENT_OTHER): Payer: Self-pay | Admitting: Nurse Practitioner

## 2021-10-24 NOTE — Progress Notes (Signed)
Cardiology Office Note:    Date:  10/28/2021   ID:  Kendra Miles, DOB June 08, 1951, MRN [id number]  PCP:  Orma Render, NP   [facility] Providers Cardiologist:  Werner Lean, MD     Referring MD: Orma Render, NP   CC:  Post hernia f/u  History of Present Illness:    Kendra Miles is a 71 y.o. female with a hx of HTN with DM, HLD with DM, prior thryoid disease, RBBB who presents for evaluation prior to hernia surgery.  Patient notes that she is doing well.   Since last visit feels better after uneventful hernia.  There are no interval hospital/ED visit.    No chest pain or pressure .  No SOB/DOE (no longer feels short of breath post hernia surgery) and no PND/Orthopnea.  No weight gain or leg swelling.  No palpitations or syncope .  Past Medical History:  Diagnosis Date   Acid reflux    Arthritis    Cholelithiasis with choledocholithiasis 02/2019   Diabetes ([place])    Headache    Hiatal hernia    Hyperlipidemia    Pneumonia    Polyp of stomach and duodenum     Past Surgical History:  Procedure Laterality Date   ABDOMINAL HYSTERECTOMY     partial   APPENDECTOMY     BIOPSY  03/08/2019   Procedure: BIOPSY;  Surgeon: Gatha Mayer, MD;  Location: [facility];  Service: Endoscopy;;   CHOLECYSTECTOMY N/A 03/09/2019   Procedure: laparoscopic cholecystectomy;  Surgeon: Jovita Kussmaul, MD;  Location: [facility];  Service: General;  Laterality: N/A;   CYST REMOVAL TRUNK     ERCP N/A 03/08/2019   Procedure: ENDOSCOPIC RETROGRADE CHOLANGIOPANCREATOGRAPHY (ERCP);  Surgeon: Gatha Mayer, MD;  Location: [facility];  Service: Endoscopy;  Laterality: N/A;   ESOPHAGOGASTRODUODENOSCOPY (EGD) WITH PROPOFOL N/A 04/24/2019   Procedure: ESOPHAGOGASTRODUODENOSCOPY (EGD) WITH PROPOFOL;  Surgeon: Ronnette Juniper, MD;  [facility]: [facility];  Service: Gastroenterology;  Laterality: N/A;   HEMOSTASIS CLIP PLACEMENT  04/24/2019   Procedure: HEMOSTASIS CLIP PLACEMENT;  Surgeon:  Ronnette Juniper, MD;  [facility]: [facility];  Service: Gastroenterology;;   LAPAROSCOPIC NISSEN FUNDOPLICATION N/A 95/04/3266   Procedure: LAPAROSCOPIC HIATAL HERNIA REPAIR;  Surgeon: Felicie Morn, MD;  Location: [facility];  Service: General;  Laterality: N/A;   PANCREATIC STENT PLACEMENT  03/08/2019   Procedure: PANCREATIC STENT PLACEMENT;  Surgeon: Gatha Mayer, MD;  [facility];  Service: Endoscopy;;   POLYPECTOMY  04/24/2019   Procedure: POLYPECTOMY;  Surgeon: Ronnette Juniper, MD;  [facility];  Service: Gastroenterology;;   REMOVAL OF STONES  03/08/2019   Procedure: REMOVAL OF STONES;  Surgeon: Gatha Mayer, MD;  [facility];  Service: Endoscopy;;   SPHINCTEROTOMY  03/08/2019   Procedure: Joan [place];  Surgeon: Gatha Mayer, MD;  [facility];  Service: Endoscopy;;   UPPER GI ENDOSCOPY  06/13/2021   Procedure: UPPER GI ENDOSCOPY;  Surgeon: Felicie Morn, MD;  Location: [facility];  Service: General;;    Current Medications: Current Meds  Medication Sig   aspirin 81 MG chewable tablet Chew 1 tablet (81 mg total) by mouth daily.   blood glucose meter kit and supplies Testing at least once per day (morning fasting), with option to test up to 4 times per day (morning fasting and as needed for symptoms) as needed for better control. Use only the strips that come in this kit with this meter- other strips will  not work properly.   calcium carbonate (OSCAL) 1500 (600 Ca) MG TABS tablet Take 0.4 tablets (600 mg total) by mouth daily.   emollient lotion Apply topically 2 (two) times daily as needed. Apply to legs as needed for itching and dryness.   fluticasone (FLONASE) 50 MCG/ACT nasal spray SPRAY 2 SPRAYS INTO EACH NOSTRIL EVERY DAY (Patient taking differently: as needed for allergies or rhinitis.)   glipiZIDE (GLUCOTROL) 5 MG tablet TAKE 1 TABLET (5 MG TOTAL) BY MOUTH 2 (TWO) TIMES DAILY BEFORE A MEAL.   lisinopril (ZESTRIL) 10 MG tablet TAKE  1 TABLET BY MOUTH EVERY DAY   loratadine (CLARITIN) 10 MG tablet Take 1 tablet (10 mg total) by mouth daily.   meloxicam (MOBIC) 7.5 MG tablet Take 1 tablet (7.5 mg total) by mouth daily.   metFORMIN (GLUCOPHAGE) 500 MG tablet Take 1 tablet (500 mg total) by mouth 2 (two) times daily with a meal.   Multiple Vitamin (MULTIVITAMIN WITH MINERALS) TABS tablet Take 1 tablet by mouth daily. Women's 50+ Multivitamin   Omega-3 Fatty Acids (FISH OIL PO) Take 2,000 mg by mouth daily.   ondansetron (ZOFRAN) 4 MG tablet Take 1 tablet (4 mg total) by mouth every 8 (eight) hours as needed for nausea or vomiting.   pantoprazole (PROTONIX) 40 MG tablet TAKE 1 TABLET BY MOUTH TWICE A DAY AFTER FIRST 30 DAYS, DECREASE TO ONCE DAILY OR AS DIRECTED BY GI (Patient taking differently: daily.)   PARoxetine (PAXIL) 30 MG tablet Take 1 tablet (30 mg total) by mouth daily.   rizatriptan (MAXALT) 10 MG tablet Take 1 tablet (10 mg total) by mouth as needed for migraine. May repeat in 2 hours if needed. Do not take more than 2 doses in 24 hours.   rosuvastatin (CRESTOR) 10 MG tablet Take 1 tablet (10 mg total) by mouth daily.     Allergies:   Ciprofloxacin, Guaifenesin er, and Penicillins   Social History   Socioeconomic History   Marital status: Widowed    Spouse name: Not on file   Number of children: 4   Years of education: Not on file   Highest education level: Not on file  Occupational History   Occupation: Surveyor, quantity: [employer]    Comment: 27 years  Tobacco Use   Smoking status: Never   Smokeless tobacco: Never  Vaping Use   Vaping Use: Never used  Substance and Sexual Activity   Alcohol use: No   Drug use: Never   Sexual activity: Not Currently  Other Topics Concern   Not on file  Social History Narrative   Lives alone.    4 adult children, 3 sons and 1 daughter. One son lives in the area, other children live in [place] with 9 grandchildren and 2 great grandchildren +1 on the way. She visits  them every year.    Mother (10) and Father (27) both alive and live in [place].       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    Social Determinants of Health   Financial Resource Strain: Not on file  Food Insecurity: Not on file  Transportation Needs: No Transportation Needs   Lack of Transportation (Medical): No   Lack of Transportation (Non-Medical): No  Physical Activity: Insufficiently Active   Days of Exercise per Week: 2 days   Minutes of Exercise per Session: 10 min  Stress: Not on file  Social Connections: Not on file    Social: Works as a Scientist, water quality at [employer]  Family History: The patient's family history includes COPD in her father; Memory loss in her mother.  ROS:   Please see the history of present illness.     All other systems reviewed and are negative.  EKGs/Labs/Other Studies Reviewed:    The following studies were reviewed today:  EKG:   05/30/21: SR rate RBBB QTc 502; JTC 430 05/30/21: SR Rate RBBB with occasional PACs  Recent Labs: 11/02/2020: TSH 1.580 05/30/2021: NT-Pro BNP 81 06/10/2021: ALT 18 06/15/2021: BUN 13; Creatinine, Ser 0.68; Hemoglobin 13.8; Platelets 229; Potassium 3.5; Sodium 140  Recent Lipid Panel    Component Value Date/Time   CHOL 174 11/02/2020 0923   TRIG 182 (H) 03/08/2021 0835   HDL 37 (L) 11/02/2020 0923   CHOLHDL 4.7 11/02/2020 0923   VLDL 38 11/02/2020 0923   LDLCALC 99 11/02/2020 0923    Physical Exam:    VS:  BP 120/80    Pulse 89    Ht '5\' 6"'  (1.676 m)    Wt 200 lb (90.7 kg)    SpO2 96%    BMI 32.28 kg/m     Wt Readings from Last 3 Encounters:  10/28/21 200 lb (90.7 kg)  08/30/21 188 lb (85.3 kg)  08/05/21 187 lb (84.8 kg)    Gen: no distress Neck: No JVD  Cardiac: No Rubs or Gallops, no Murmur, RRR rare irregular beat +2 radial pulses Respiratory: Clear to auscultation bilaterally, normal effort, normal  respiratory rate GI: Soft,  nontender, non-distended  MS: No edema;  moves all extremities Integument: Skin feels war, Neuro:  At time of evaluation, alert and oriented to person/place/time/situation  Psych: Normal affect, patient feels well  ASSESSMENT:    1. Hyperlipidemia associated with type 2 diabetes mellitus ([place])   2. RBBB   3. PAC (premature atrial contraction)   4. Essential hypertension     PLAN:    DOE has now resolved  RBBB and PACs (asymptomatic) HTN with DM HLD with DM - Did not get echo - continue ASA 81 mg Po daily (reasonable in the setting of DM - continue lisinopril 10 mg PO daily - Omega 3 supplement is ok in the setting of HTN and has less of a role in her HLD - will continue rosuvastatin 10, lipids and ALT today - we set LDL goal < 70 for heart attack and stroke prevention, if above goal on labs will plans for me or APP visit one year     Medication Adjustments/Labs and Tests Ordered: Current medicines are reviewed at length with the patient today.  Concerns regarding medicines are outlined above.  Orders Placed This Encounter  Procedures   ALT   Lipid panel    No orders of the defined types were placed in this encounter.    Patient Instructions  Medication Instructions:  Your physician recommends that you continue on your current medications as directed. Please refer to the Current Medication list given to you today.  *If you need a refill on your cardiac medications before your next appointment, please call your pharmacy*   Lab Work: TODAY: FLP, ALT  If you have labs (blood work) drawn today and your tests are completely normal, you will receive your results only by: [place] (if you have MyChart) OR A paper copy in the mail If you have any lab test that is abnormal or we need to change your treatment,  we will call you to review the results.   Testing/Procedures: NONE   Follow-Up: Based on lab results or As needed At [facility], you and your  health needs are our priority.  As part of our continuing mission to provide you with exceptional heart care, we have created designated Provider Care Teams.  These Care Teams include your primary Cardiologist (physician) and Advanced Practice Providers (APPs -  Physician Assistants and Nurse Practitioners) who all work together to provide you with the care you need, when you need it.    Provider:   Werner Lean, MD      Signed, Werner Lean, MD  10/28/2021 8:31 AM    [place]

## 2021-10-26 ENCOUNTER — Other Ambulatory Visit (HOSPITAL_BASED_OUTPATIENT_CLINIC_OR_DEPARTMENT_OTHER): Payer: Self-pay | Admitting: Nurse Practitioner

## 2021-10-26 DIAGNOSIS — E1165 Type 2 diabetes mellitus with hyperglycemia: Secondary | ICD-10-CM

## 2021-10-26 DIAGNOSIS — I10 Essential (primary) hypertension: Secondary | ICD-10-CM

## 2021-10-26 DIAGNOSIS — K219 Gastro-esophageal reflux disease without esophagitis: Secondary | ICD-10-CM

## 2021-10-26 DIAGNOSIS — Z794 Long term (current) use of insulin: Secondary | ICD-10-CM

## 2021-10-28 ENCOUNTER — Ambulatory Visit (INDEPENDENT_AMBULATORY_CARE_PROVIDER_SITE_OTHER): Payer: Medicare Other | Admitting: Internal Medicine

## 2021-10-28 ENCOUNTER — Encounter: Payer: Self-pay | Admitting: Internal Medicine

## 2021-10-28 ENCOUNTER — Other Ambulatory Visit: Payer: Self-pay

## 2021-10-28 VITALS — BP 120/80 | HR 89 | Ht 66.0 in | Wt 200.0 lb

## 2021-10-28 DIAGNOSIS — E785 Hyperlipidemia, unspecified: Secondary | ICD-10-CM | POA: Diagnosis not present

## 2021-10-28 DIAGNOSIS — I10 Essential (primary) hypertension: Secondary | ICD-10-CM | POA: Diagnosis not present

## 2021-10-28 DIAGNOSIS — E1169 Type 2 diabetes mellitus with other specified complication: Secondary | ICD-10-CM | POA: Diagnosis not present

## 2021-10-28 DIAGNOSIS — I491 Atrial premature depolarization: Secondary | ICD-10-CM | POA: Diagnosis not present

## 2021-10-28 DIAGNOSIS — I4510 Unspecified right bundle-branch block: Secondary | ICD-10-CM | POA: Diagnosis not present

## 2021-10-28 LAB — LIPID PANEL
Chol/HDL Ratio: 4.3 ratio (ref 0.0–4.4)
Cholesterol, Total: 154 mg/dL (ref 100–199)
HDL: 36 mg/dL — ABNORMAL LOW (ref >39)
LDL Chol Calc (NIH): 89 mg/dL (ref 0–99)
Triglycerides: 166 mg/dL — ABNORMAL HIGH (ref 0–149)
VLDL Cholesterol Cal: 29 mg/dL (ref 5–40)

## 2021-10-28 LAB — HM DIABETES EYE EXAM

## 2021-10-28 LAB — ALT: ALT: 15 IU/L (ref 0–32)

## 2021-10-28 NOTE — Patient Instructions (Signed)
Medication Instructions:  ?Your physician recommends that you continue on your current medications as directed. Please refer to the Current Medication list given to you today. ? ?*If you need a refill on your cardiac medications before your next appointment, please call your pharmacy* ? ? ?Lab Work: ?TODAY: FLP, ALT ? ?If you have labs (blood work) drawn today and your tests are completely normal, you will receive your results only by: ?MyChart Message (if you have MyChart) OR ?A paper copy in the mail ?If you have any lab test that is abnormal or we need to change your treatment, we will call you to review the results. ? ? ?Testing/Procedures: ?NONE ? ? ?Follow-Up: Based on lab results or As needed ?At [facility], you and your health needs are our priority.  As part of our continuing mission to provide you with exceptional heart care, we have created designated Provider Care Teams.  These Care Teams include your primary Cardiologist (physician) and Advanced Practice Providers (APPs -  Physician Assistants and Nurse Practitioners) who all work together to provide you with the care you need, when you need it. ? ? ? ?Provider:   ?Werner Lean, MD   ? ?

## 2021-11-03 ENCOUNTER — Encounter (HOSPITAL_BASED_OUTPATIENT_CLINIC_OR_DEPARTMENT_OTHER): Payer: Self-pay | Admitting: Nurse Practitioner

## 2021-11-03 ENCOUNTER — Telehealth: Payer: Self-pay

## 2021-11-03 ENCOUNTER — Other Ambulatory Visit: Payer: Self-pay

## 2021-11-03 ENCOUNTER — Ambulatory Visit (INDEPENDENT_AMBULATORY_CARE_PROVIDER_SITE_OTHER): Payer: Medicare Other | Admitting: Nurse Practitioner

## 2021-11-03 VITALS — BP 122/82 | HR 86 | Ht 66.0 in | Wt 199.6 lb

## 2021-11-03 DIAGNOSIS — Z794 Long term (current) use of insulin: Secondary | ICD-10-CM | POA: Diagnosis not present

## 2021-11-03 DIAGNOSIS — E782 Mixed hyperlipidemia: Secondary | ICD-10-CM | POA: Diagnosis not present

## 2021-11-03 DIAGNOSIS — E785 Hyperlipidemia, unspecified: Secondary | ICD-10-CM | POA: Diagnosis not present

## 2021-11-03 DIAGNOSIS — I152 Hypertension secondary to endocrine disorders: Secondary | ICD-10-CM | POA: Diagnosis not present

## 2021-11-03 DIAGNOSIS — E1165 Type 2 diabetes mellitus with hyperglycemia: Secondary | ICD-10-CM | POA: Diagnosis not present

## 2021-11-03 DIAGNOSIS — E559 Vitamin D deficiency, unspecified: Secondary | ICD-10-CM | POA: Diagnosis not present

## 2021-11-03 DIAGNOSIS — E781 Pure hyperglyceridemia: Secondary | ICD-10-CM | POA: Diagnosis not present

## 2021-11-03 DIAGNOSIS — E1169 Type 2 diabetes mellitus with other specified complication: Secondary | ICD-10-CM | POA: Diagnosis not present

## 2021-11-03 DIAGNOSIS — E1159 Type 2 diabetes mellitus with other circulatory complications: Secondary | ICD-10-CM | POA: Diagnosis not present

## 2021-11-03 DIAGNOSIS — R7989 Other specified abnormal findings of blood chemistry: Secondary | ICD-10-CM | POA: Diagnosis not present

## 2021-11-03 MED ORDER — ROSUVASTATIN CALCIUM 20 MG PO TABS: 20.0000 mg | ORAL_TABLET | Freq: Every day | ORAL | 3 refills | Status: DC

## 2021-11-03 NOTE — Assessment & Plan Note (Signed)
Resolved. Normal on most recent labs. ?Will continue to monitor.  ?

## 2021-11-03 NOTE — Telephone Encounter (Signed)
The patient has been notified of the result and verbalized understanding.  All questions (if any) were answered. ?Precious Gilding, RN 11/03/2021 4:23 PM   ?Fasting lab work scheduled for 01/31/22. ?

## 2021-11-03 NOTE — Telephone Encounter (Signed)
-----   Message from Werner Lean, MD sent at 10/30/2021  1:50 PM EDT ----- ?Regarding: Recent labs ?LDL above goal for MI prevention, increase rosuvastatin to 20 mg and labs in 3 months ? ?

## 2021-11-03 NOTE — Assessment & Plan Note (Signed)
Chronic. Well controlled. No alarm sx present today.  ?Labs today for evaluation.  ?Continue current medications.  ?Will continue to collaborate with cardiology.  ?No refills needed today.  ?F/U in 3 months or sooner if needed.  ?

## 2021-11-03 NOTE — Assessment & Plan Note (Addendum)
Recent labs with cardiology reviewed today with patient. TRG levels have improved, but still slightly elevated. ALT continue to be normal.  ?Continue current medications. No symptoms present today.  ?Continue to monitor fats in diet and keep saturated fats to less than 13g per day ?Will continue collaboration with cardiology.  ?F/Ui in 3 months  ?

## 2021-11-03 NOTE — Patient Instructions (Signed)
It was a pleasure seeing you today. I hope your time spent with [place] was pleasant and helpful. Please let us know if there is anything we can do to improve the service you receive.  ?Good luck with your cataract surgery!  ? ?Today we discussed concerns with: ? ?Type 2 diabetes mellitus with hyperglycemia, with long-term current use of insulin (HCC) - Plan: CBC with Differential/Platelet, Comprehensive metabolic panel, VITAMIN D 25 Hydroxy (Vit-D Deficiency, Fractures), Hemoglobin A1c ? ?Hypertension associated with diabetes ([place]) - Plan: CBC with Differential/Platelet, Comprehensive metabolic panel, VITAMIN D 25 Hydroxy (Vit-D Deficiency, Fractures), Hemoglobin A1c ? ?Mixed hyperlipidemia - Plan: CBC with Differential/Platelet, Comprehensive metabolic panel, VITAMIN D 25 Hydroxy (Vit-D Deficiency, Fractures), Hemoglobin A1c ? ?High blood triglycerides - Plan: CBC with Differential/Platelet, Comprehensive metabolic panel, VITAMIN D 25 Hydroxy (Vit-D Deficiency, Fractures), Hemoglobin A1c ? ?Vitamin D deficiency - Plan: CBC with Differential/Platelet, Comprehensive metabolic panel, VITAMIN D 25 Hydroxy (Vit-D Deficiency, Fractures), Hemoglobin A1c ? ?Elevated LFTs - Plan: CBC with Differential/Platelet, Comprehensive metabolic panel, VITAMIN D 25 Hydroxy (Vit-D Deficiency, Fractures), Hemoglobin A1c ? ? ? ?The following orders have been placed for you today: ? ?Orders Placed This Encounter  ?Procedures  ? CBC with Differential/Platelet  ?  Order Specific Question:   Release to patient  ?  Answer:   Immediate  ? Comprehensive metabolic panel  ?  Order Specific Question:   Has the patient fasted?  ?  Answer:   Yes  ?  Order Specific Question:   Release to patient  ?  Answer:   Immediate  ? VITAMIN D 25 Hydroxy (Vit-D Deficiency, Fractures)  ?  Order Specific Question:   Release to patient  ?  Answer:   Immediate  ? Hemoglobin A1c  ?  Order Specific Question:   Release to patient  ?  Answer:   Immediate  ? ? ? ?The  following recommendations are made for your care: ?If the claritin is not helping with your allergy symptoms, you may consider trying levocitirizine (Zyxal brand name). This seems to be helping many of my patients, but be sure to take this at bedtime because it will make you sleepy. You can even try 1/2 dose if you would like.  ?Keep working on your diet and try to get at least 20 minutes of purposeful activity daily.  ? ? ?Important Office Information ?Lab Results ?If labs were ordered, please note that you will see results through [place] as soon as they come available from [place].  ?It takes up to 5 business days for the results to be routed to me and for me to review them once all of the lab results have come through from [facility]. I will make recommendations based on your results and send these through [place] or someone from the office will call you to discuss. If your labs are abnormal, we may contact you to schedule a visit to discuss the results and make recommendations.  ?If you have not heard from [place] within 5 business days or you have waited longer than a week and your lab results have not come through on [place], please feel free to call the office or send a message through [place] to follow-up on these labs.  ? ?Referrals ?If referrals were placed today, the office where the referral was sent will contact you either by phone or through [place] to set up scheduling. Please note that it can take up to a week for the referral office to contact you. If you  do not hear from them in a week, please contact the referral office directly to inquire about scheduling.  ? ?Condition Treated ?If your condition worsens or you begin to have new symptoms, please schedule a follow-up appointment for further evaluation. If you are not sure if an appointment is needed, you may call the office to leave a message for the nurse and someone will contact you with recommendations.  ?If you have an urgent or life threatening  emergency, please do not call the office, but seek emergency evaluation by calling 911 or going to the nearest emergency room for evaluation.  ? ?MyChart and Phone Calls ?Please do not use MyChart for urgent messages. It may take up to 3 business days for MyChart messages to be read by staff and if they are unable to handle the request, an additional 3 business days for them to be routed to me and for my response.  ?Messages sent to the provider through [place] do not come directly to the provider, please allow time for these messages to be routed and for me to respond.  ?We get a large volume of MyChart messages daily and these are responded to in the order received.  ? ?For urgent messages, please call the office at [phone] and speak with the front office staff or leave a message on the line of my assistant for guidance.  ?We are seeing patients from the hours of 8:00 am through 5:00 pm and calls directly to the nurse may not be answered immediately due to seeing patients, but your call will be returned as soon as possible.  ?Phone  messages received after 4:00 PM Monday through Thursday may not be returned until the following business day. Phone messages received after 11:00 AM on Friday may not be returned until Monday.  ? ?After Hours ?We share on call hours with providers from other offices. If you have an urgent need after hours that cannot wait until the next business day, please contact the on call provider by calling the office number. A nurse will speak with you and contact the provider if needed for recommendations.  ?If you have an urgent or life threatening emergency after hours, please do not call the on call provider, but seek emergency evaluation by calling 911 or going to the nearest emergency room for evaluation.  ? ?Paperwork ?All paperwork requires a minimum of 5 days to complete and return to you or the designated personnel. Please keep this in mind when bringing in forms or sending  requests for paperwork completion to the office.  ?  ?

## 2021-11-03 NOTE — Progress Notes (Signed)
? ?Established Patient Office Visit ? ?Subjective:  ?Patient ID: Kendra Miles, female    DOB: [birth year]  Age: 71 y.o. MRN: [id number] ? ?CC:  ?Chief Complaint  ?Patient presents with  ? Follow-up  ?  Patient presents today for follow up overall she is feeling great. Patient is having cataract surgery upcoming she is just waiting on call to schedule. No concerns today or medication refills needed.   ? ? ?HPI ?Kendra Miles presents for follow-up for DM, HTN, HLD. ? ?DM/HTN/HLD ?Kendra Miles endorses: ?- checking her BG at home. ?- 2-3 readings over 200 but the rest have been less than that for fasting levels ?- no worsening paresthesia's ?- vision changes are present- will be having cataract surgery in the near future ?- no problems or SE with medication ?- haven't had any missed doses of medications ?- no chest pain, ShOB, weakness, dizziness, increased hunger/thirst/urination ?- has headaches, but these are better than they have been in the past ? - feels like these are sinus related ? - mild headache everyday ?- she is drinking plenty of water ?- no abdominal pain or bowel changes. ? ?Outpatient Medications Prior to Visit  ?Medication Sig Dispense Refill  ? aspirin 81 MG chewable tablet Chew 1 tablet (81 mg total) by mouth daily. 90 tablet 1  ? blood glucose meter kit and supplies Testing at least once per day (morning fasting), with option to test up to 4 times per day (morning fasting and as needed for symptoms) as needed for better control. Use only the strips that come in this kit with this meter- other strips will not work properly. 1 each 99  ? calcium carbonate (OSCAL) 1500 (600 Ca) MG TABS tablet Take 0.4 tablets (600 mg total) by mouth daily. 90 tablet 1  ? emollient lotion Apply topically 2 (two) times daily as needed. Apply to legs as needed for itching and dryness. 240 mL 1  ? fluticasone (FLONASE) 50 MCG/ACT nasal spray SPRAY 2 SPRAYS INTO EACH NOSTRIL EVERY DAY (Patient taking differently: as needed  for allergies or rhinitis.) 16 mL 2  ? glipiZIDE (GLUCOTROL) 5 MG tablet TAKE 1 TABLET (5 MG TOTAL) BY MOUTH 2 (TWO) TIMES DAILY BEFORE A MEAL. 180 tablet 1  ? lisinopril (ZESTRIL) 10 MG tablet TAKE 1 TABLET BY MOUTH EVERY DAY 90 tablet 1  ? loratadine (CLARITIN) 10 MG tablet Take 1 tablet (10 mg total) by mouth daily. 90 tablet 3  ? meloxicam (MOBIC) 7.5 MG tablet Take 1 tablet (7.5 mg total) by mouth daily. 30 tablet 6  ? metFORMIN (GLUCOPHAGE) 500 MG tablet Take 1 tablet (500 mg total) by mouth 2 (two) times daily with a meal. 180 tablet 1  ? Multiple Vitamin (MULTIVITAMIN WITH MINERALS) TABS tablet Take 1 tablet by mouth daily. Women's 50+ Multivitamin    ? Omega-3 Fatty Acids (FISH OIL PO) Take 2,000 mg by mouth daily.    ? ondansetron (ZOFRAN) 4 MG tablet Take 1 tablet (4 mg total) by mouth every 8 (eight) hours as needed for nausea or vomiting. 30 tablet 3  ? pantoprazole (PROTONIX) 40 MG tablet TAKE 1 TABLET BY MOUTH TWICE A DAY AFTER FIRST 30 DAYS, DECREASE TO ONCE DAILY OR AS DIRECTED BY GI (Patient taking differently: daily.) 180 tablet 1  ? PARoxetine (PAXIL) 30 MG tablet Take 1 tablet (30 mg total) by mouth daily. 90 tablet 3  ? rizatriptan (MAXALT) 10 MG tablet Take 1 tablet (10 mg total) by mouth as  needed for migraine. May repeat in 2 hours if needed. Do not take more than 2 doses in 24 hours. 6 tablet 3  ? rosuvastatin (CRESTOR) 10 MG tablet Take 1 tablet (10 mg total) by mouth daily. 90 tablet 3  ? ?No facility-administered medications prior to visit.  ? ? ?Allergies  ?Allergen Reactions  ? Ciprofloxacin Other (See Comments)  ?  Stomach cramps ?  ? Guaifenesin Er Other (See Comments)  ?  Mucinex - keeps pt awake all night   ? Penicillins Palpitations  ?  Did it involve swelling of the face/tongue/throat, SOB, or low BP? No ?Did it involve sudden or severe rash/hives, skin peeling, or any reaction on the inside of your mouth or nose? No ?Did you need to seek medical attention at a hospital or  doctor's office? No ?When did it last happen? [phone]     ?If all above answers are ?NO?, may proceed with cephalosporin use. ?  ? ? ?ROS ?Review of Systems ?All review of systems negative except what is listed in the HPI ? ?  ?Objective:  ?  ?Physical Exam ?Vitals and nursing note reviewed.  ?Constitutional:   ?   Appearance: Normal appearance.  ?HENT:  ?   Head: Normocephalic.  ?Eyes:  ?   Extraocular Movements: Extraocular movements intact.  ?   Conjunctiva/sclera: Conjunctivae normal.  ?   Pupils: Pupils are equal, round, and reactive to light.  ?Neck:  ?   Vascular: No carotid bruit.  ?Cardiovascular:  ?   Rate and Rhythm: Normal rate and regular rhythm.  ?   Pulses: Normal pulses.  ?   Heart sounds: Normal heart sounds.  ?Pulmonary:  ?   Effort: Pulmonary effort is normal.  ?   Breath sounds: Normal breath sounds.  ?Abdominal:  ?   General: Bowel sounds are normal.  ?   Palpations: Abdomen is soft.  ?Musculoskeletal:  ?   Cervical back: Normal range of motion.  ?   Right lower leg: No edema.  ?   Left lower leg: No edema.  ?Skin: ?   General: Skin is warm and dry.  ?   Capillary Refill: Capillary refill takes less than 2 seconds.  ?Neurological:  ?   General: No focal deficit present.  ?   Mental Status: She is alert and oriented to person, place, and time.  ?Psychiatric:     ?   Mood and Affect: Mood normal.     ?   Behavior: Behavior normal.     ?   Thought Content: Thought content normal.     ?   Judgment: Judgment normal.  ? ? ?BP 122/82   Pulse 86   Ht '5\' 6"'  (1.676 m)   Wt 199 lb 9.6 oz (90.5 kg)   SpO2 97%   BMI 32.22 kg/m?  ?Wt Readings from Last 3 Encounters:  ?11/03/21 199 lb 9.6 oz (90.5 kg)  ?10/28/21 200 lb (90.7 kg)  ?08/30/21 188 lb (85.3 kg)  ? ? ?  ?Assessment & Plan:  ? ?Problem List Items Addressed This Visit   ? ? Hypertension associated with diabetes ([place])  ?  Chronic. Well controlled. No alarm sx present today.  ?Labs today for evaluation.  ?Continue current medications.  ?Will  continue to collaborate with cardiology.  ?No refills needed today.  ?F/U in 3 months or sooner if needed.  ?  ?  ? Relevant Orders  ? CBC with Differential/Platelet  ? Comprehensive metabolic panel  ?  VITAMIN D 25 Hydroxy (Vit-D Deficiency, Fractures)  ? Hemoglobin A1c  ? [facility] DIABETES EYE EXAM (Completed)  ? Type 2 diabetes mellitus with hyperglycemia, with long-term current use of insulin ([facility]CC) - Primary  ?  Chronic. Will obtain labs for A1c today. No alarm sx present.  ?Monitoring diet and taking medication as prescribed.  ?No signs of hypoglycemia.  ?Will make changes to plan of care as needed based on labs. ?F/U in 3 months or sooner if needed.  ?  ?  ? Relevant Orders  ? CBC with Differential/Platelet  ? Comprehensive metabolic panel  ? VITAMIN D 25 Hydroxy (Vit-D Deficiency, Fractures)  ? Hemoglobin A1c  ? [facility] DIABETES EYE EXAM (Completed)  ? High blood triglycerides  ?  Recent labs with cardiology reviewed today with patient. TRG levels have improved, but still slightly elevated. ALT continue to be normal.  ?Continue current medications. No symptoms present today.  ?Continue to monitor fats in diet and keep saturated fats to less than 13g per day ?Will continue collaboration with cardiology.  ?F/Ui in 3 months  ?  ?  ? Relevant Orders  ? CBC with Differential/Platelet  ? Comprehensive metabolic panel  ? VITAMIN D 25 Hydroxy (Vit-D Deficiency, Fractures)  ? Hemoglobin A1c  ? Vitamin D deficiency  ?  Completed 12 week high dose course yesterday. Will check labs today for re-evaluation. ?If levels >50, will plan to transition to 1000u daily Vit D3, if lower, will plan to continue HD Vitamin D3 for maintenance.  ? ?  ?  ? Relevant Orders  ? CBC with Differential/Platelet  ? Comprehensive metabolic panel  ? VITAMIN D 25 Hydroxy (Vit-D Deficiency, Fractures)  ? Hemoglobin A1c  ? Elevated LFTs  ?  Resolved. Normal on most recent labs. ?Will continue to monitor.  ?  ?  ? Relevant Orders  ? CBC with  Differential/Platelet  ? Comprehensive metabolic panel  ? VITAMIN D 25 Hydroxy (Vit-D Deficiency, Fractures)  ? Hemoglobin A1c  ? ? ?No orders of the defined types were placed in this encounter. ? ? ?Follow-up: Return in about 3 months

## 2021-11-03 NOTE — Assessment & Plan Note (Signed)
Completed 12 week high dose course yesterday. Will check labs today for re-evaluation. ?If levels >50, will plan to transition to 1000u daily Vit D3, if lower, will plan to continue HD Vitamin D3 for maintenance.  ? ?

## 2021-11-03 NOTE — Assessment & Plan Note (Signed)
Chronic. Will obtain labs for A1c today. No alarm sx present.  ?Monitoring diet and taking medication as prescribed.  ?No signs of hypoglycemia.  ?Will make changes to plan of care as needed based on labs. ?F/U in 3 months or sooner if needed.  ?

## 2021-11-07 LAB — COMPREHENSIVE METABOLIC PANEL
ALT: 14 IU/L (ref 0–32)
AST: 18 IU/L (ref 0–40)
Albumin/Globulin Ratio: 1.9 (ref 1.2–2.2)
Albumin: 4.2 g/dL (ref 3.8–4.8)
Alkaline Phosphatase: 131 IU/L — ABNORMAL HIGH (ref 44–121)
BUN/Creatinine Ratio: 22 (ref 12–28)
BUN: 19 mg/dL (ref 8–27)
Bilirubin Total: 0.4 mg/dL (ref 0.0–1.2)
CO2: 20 mmol/L (ref 20–29)
Calcium: 9.5 mg/dL (ref 8.7–10.3)
Chloride: 104 mmol/L (ref 96–106)
Creatinine, Ser: 0.85 mg/dL (ref 0.57–1.00)
Globulin, Total: 2.2 g/dL (ref 1.5–4.5)
Glucose: 192 mg/dL — ABNORMAL HIGH (ref 70–99)
Potassium: 5.1 mmol/L (ref 3.5–5.2)
Sodium: 143 mmol/L (ref 134–144)
Total Protein: 6.4 g/dL (ref 6.0–8.5)
eGFR: 75 mL/min/{1.73_m2} (ref >59)

## 2021-11-07 LAB — CBC WITH DIFFERENTIAL/PLATELET
Basophils Absolute: 0 10*3/uL (ref 0.0–0.2)
Basos: 0 %
EOS (ABSOLUTE): 0.3 10*3/uL (ref 0.0–0.4)
Eos: 4 %
Hematocrit: 45.2 % (ref 34.0–46.6)
Hemoglobin: 15.1 g/dL (ref 11.1–15.9)
Immature Grans (Abs): 0 10*3/uL (ref 0.0–0.1)
Immature Granulocytes: 0 %
Lymphocytes Absolute: 2.4 10*3/uL (ref 0.7–3.1)
Lymphs: 30 %
MCH: 29.3 pg (ref 26.6–33.0)
MCHC: 33.4 g/dL (ref 31.5–35.7)
MCV: 88 fL (ref 79–97)
Monocytes Absolute: 0.5 10*3/uL (ref 0.1–0.9)
Monocytes: 7 %
Neutrophils Absolute: 4.8 10*3/uL (ref 1.4–7.0)
Neutrophils: 59 %
Platelets: 331 10*3/uL (ref 150–450)
RBC: 5.15 x10E6/uL (ref 3.77–5.28)
RDW: 12.8 % (ref 11.7–15.4)
WBC: 8 10*3/uL (ref 3.4–10.8)

## 2021-11-07 LAB — HEMOGLOBIN A1C
Est. average glucose Bld gHb Est-mCnc: 232 mg/dL
Hgb A1c MFr Bld: 9.7 % — ABNORMAL HIGH (ref 4.8–5.6)

## 2021-11-07 LAB — VITAMIN D 25 HYDROXY (VIT D DEFICIENCY, FRACTURES)

## 2021-11-09 ENCOUNTER — Other Ambulatory Visit (HOSPITAL_BASED_OUTPATIENT_CLINIC_OR_DEPARTMENT_OTHER): Payer: Self-pay | Admitting: Nurse Practitioner

## 2021-11-09 DIAGNOSIS — E1165 Type 2 diabetes mellitus with hyperglycemia: Secondary | ICD-10-CM

## 2021-11-09 MED ORDER — GLIPIZIDE 10 MG PO TABS: 10.0000 mg | ORAL_TABLET | Freq: Two times a day (BID) | ORAL | 3 refills | Status: DC

## 2021-11-11 ENCOUNTER — Telehealth (HOSPITAL_BASED_OUTPATIENT_CLINIC_OR_DEPARTMENT_OTHER): Payer: Self-pay | Admitting: Nurse Practitioner

## 2021-11-11 NOTE — Telephone Encounter (Signed)
Received a fax from [facility] [place] office that pt was never seen in that office. ROI request we sent will be shredded. ?Please advise. ?

## 2021-12-15 ENCOUNTER — Other Ambulatory Visit (HOSPITAL_BASED_OUTPATIENT_CLINIC_OR_DEPARTMENT_OTHER): Payer: Self-pay | Admitting: Nurse Practitioner

## 2021-12-16 ENCOUNTER — Telehealth (HOSPITAL_BASED_OUTPATIENT_CLINIC_OR_DEPARTMENT_OTHER): Payer: Medicare Other

## 2022-01-18 DIAGNOSIS — S022XXA Fracture of nasal bones, initial encounter for closed fracture: Secondary | ICD-10-CM | POA: Diagnosis not present

## 2022-01-18 DIAGNOSIS — I10 Essential (primary) hypertension: Secondary | ICD-10-CM | POA: Diagnosis not present

## 2022-01-18 DIAGNOSIS — E785 Hyperlipidemia, unspecified: Secondary | ICD-10-CM | POA: Diagnosis not present

## 2022-01-18 DIAGNOSIS — E119 Type 2 diabetes mellitus without complications: Secondary | ICD-10-CM | POA: Diagnosis not present

## 2022-01-18 DIAGNOSIS — S0121XA Laceration without foreign body of nose, initial encounter: Secondary | ICD-10-CM | POA: Diagnosis not present

## 2022-01-19 DIAGNOSIS — S022XXA Fracture of nasal bones, initial encounter for closed fracture: Secondary | ICD-10-CM | POA: Diagnosis not present

## 2022-01-26 ENCOUNTER — Encounter (HOSPITAL_COMMUNITY): Payer: Self-pay

## 2022-01-26 ENCOUNTER — Emergency Department (HOSPITAL_COMMUNITY)
Admission: EM | Admit: 2022-01-26 | Discharge: 2022-01-26 | Disposition: A | Discharge status: Home / Self Care | Payer: Medicare Other | Attending: Emergency Medicine | Admitting: Emergency Medicine

## 2022-01-26 ENCOUNTER — Other Ambulatory Visit: Payer: Self-pay

## 2022-01-26 DIAGNOSIS — S0181XD Laceration without foreign body of other part of head, subsequent encounter: Secondary | ICD-10-CM | POA: Diagnosis not present

## 2022-01-26 DIAGNOSIS — Z4802 Encounter for removal of sutures: Secondary | ICD-10-CM

## 2022-01-26 DIAGNOSIS — Z4800 Encounter for change or removal of nonsurgical wound dressing: Secondary | ICD-10-CM | POA: Insufficient documentation

## 2022-01-26 DIAGNOSIS — Z7982 Long term (current) use of aspirin: Secondary | ICD-10-CM | POA: Insufficient documentation

## 2022-01-26 NOTE — Discharge Instructions (Signed)
Return for redness drainage or if you get a fever.  You can apply an ointment a couple times a day this could be as simple as Vaseline but could also be an antibiotic ointment if you wish..  Once it is healed please try to avoid prolonged sun exposure use sunscreen.  Gells that have silicone antigens have been shown to reduce scarring and some research.  Follow-up with a plastic surgeon if you wish.

## 2022-01-26 NOTE — ED Triage Notes (Addendum)
Patient states she had sutures placed to her nose 8 days ago in [place] and is here to have removed.

## 2022-01-26 NOTE — ED Provider Notes (Signed)
[facility] DEPT Provider Note   CSN: [id number] Arrival date & time: 01/26/22  4098     History  Chief Complaint  Patient presents with   Suture / Staple Removal    Kendra Miles is a 71 y.o. female.  71 yo F with a chief complaints of needing suture removal.  The patient was in [place] and she lost her balance and fell and broke her nose.  This was repaired there and she was post to get the sutures removed a couple days ago.  She went to go to her family doctor but they told her they were unable to remove these and that she needed to come to the hospital to have them done.  She denies any issue with the wound.  Denies any redness drainage or fever.   Suture / Staple Removal       Home Medications Prior to Admission medications   Medication Sig Start Date End Date Taking? Authorizing Provider  aspirin 81 MG chewable tablet Chew 1 tablet (81 mg total) by mouth daily. 11/02/20   Orma Render, NP  blood glucose meter kit and supplies Testing at least once per day (morning fasting), with option to test up to 4 times per day (morning fasting and as needed for symptoms) as needed for better control. Use only the strips that come in this kit with this meter- other strips will not work properly. 11/02/20   Orma Render, NP  calcium carbonate (OSCAL) 1500 (600 Ca) MG TABS tablet Take 0.4 tablets (600 mg total) by mouth daily. 11/02/20   Orma Render, NP  emollient lotion Apply topically 2 (two) times daily as needed. Apply to legs as needed for itching and dryness. 11/02/20   Orma Render, NP  fluticasone (FLONASE) 50 MCG/ACT nasal spray SPRAY 2 SPRAYS INTO EACH NOSTRIL EVERY DAY 12/15/21   Early, Coralee Pesa, NP  glipiZIDE (GLUCOTROL) 10 MG tablet Take 1 tablet (10 mg total) by mouth 2 (two) times daily before a meal. 11/09/21   Early, Coralee Pesa, NP  lisinopril (ZESTRIL) 10 MG tablet TAKE 1 TABLET BY MOUTH EVERY DAY 10/26/21   Early, Coralee Pesa, NP  loratadine (CLARITIN) 10  MG tablet Take 1 tablet (10 mg total) by mouth daily. 02/04/21   Orma Render, NP  meloxicam (MOBIC) 7.5 MG tablet Take 1 tablet (7.5 mg total) by mouth daily. 08/05/21   Orma Render, NP  metFORMIN (GLUCOPHAGE) 500 MG tablet Take 1 tablet (500 mg total) by mouth 2 (two) times daily with a meal. 05/06/21   Early, Coralee Pesa, NP  Multiple Vitamin (MULTIVITAMIN WITH MINERALS) TABS tablet Take 1 tablet by mouth daily. Women's 50+ Multivitamin    [provider]  Omega-3 Fatty Acids (FISH OIL PO) Take 2,000 mg by mouth daily.    [provider]  ondansetron (ZOFRAN) 4 MG tablet Take 1 tablet (4 mg total) by mouth every 8 (eight) hours as needed for nausea or vomiting. 05/06/21   Early, Coralee Pesa, NP  pantoprazole (PROTONIX) 40 MG tablet TAKE 1 TABLET BY MOUTH TWICE A DAY AFTER FIRST 30 DAYS, DECREASE TO ONCE DAILY OR AS DIRECTED BY GI Patient taking differently: daily. 10/26/21   Orma Render, NP  PARoxetine (PAXIL) 30 MG tablet Take 1 tablet (30 mg total) by mouth daily. 02/03/21   Orma Render, NP  rizatriptan (MAXALT) 10 MG tablet Take 1 tablet (10 mg total) by mouth as needed for  migraine. May repeat in 2 hours if needed. Do not take more than 2 doses in 24 hours. 07/20/21   Orma Render, NP  rosuvastatin (CRESTOR) 20 MG tablet Take 1 tablet (20 mg total) by mouth daily. 11/03/21   Werner Lean, MD      Allergies    Ciprofloxacin, Guaifenesin er, and Penicillins    Review of Systems   Review of Systems  Physical Exam Updated Vital Signs BP (!) 118/92 (BP Location: Left Arm)   Pulse 85   Temp 98 F (36.7 C) (Oral)   Resp 18   Ht '5\' 6"'  (1.676 m)   Wt 89.8 kg   SpO2 98%   BMI 31.96 kg/m  Physical Exam Vitals and nursing note reviewed.  Constitutional:      General: She is not in acute distress.    Appearance: She is well-developed. She is not diaphoretic.  HENT:     Head: Normocephalic.     Comments: Bruising around bilateral eyes.  She has sutures along the  bridge of the nose with scabbing.  Dried blood to the left naris.  No nasal septal hematoma. Eyes:     Pupils: Pupils are equal, round, and reactive to light.  Cardiovascular:     Rate and Rhythm: Normal rate and regular rhythm.     Heart sounds: No murmur heard.    No friction rub. No gallop.  Pulmonary:     Effort: Pulmonary effort is normal.     Breath sounds: No wheezing or rales.  Abdominal:     General: There is no distension.     Palpations: Abdomen is soft.     Tenderness: There is no abdominal tenderness.  Musculoskeletal:        General: No tenderness.     Cervical back: Normal range of motion and neck supple.  Skin:    General: Skin is warm and dry.  Neurological:     Mental Status: She is alert and oriented to person, place, and time.  Psychiatric:        Behavior: Behavior normal.     ED Results / Procedures / Treatments   Labs (all labs ordered are listed, but only abnormal results are displayed) Labs Reviewed - No data to display  EKG None  Radiology No results found.  Procedures .Suture Removal  Date/Time: 01/26/2022 9:32 AM  Performed by: Deno Etienne, DO Authorized by: Deno Etienne, DO   Consent:    Consent obtained:  Verbal   Consent given by:  Patient   Risks, benefits, and alternatives were discussed: yes     Risks discussed:  Bleeding, pain and wound separation   Alternatives discussed:  Delayed treatment, alternative treatment and no treatment Universal protocol:    Procedure explained and questions answered to patient or proxy's satisfaction: yes     Imaging studies available: no     Immediately prior to procedure, a time out was called: yes     Patient identity confirmed:  Verbally with patient Location:    Location:  Head/neck   Head/neck location:  Nose Procedure details:    Wound appearance:  No signs of infection   Number of sutures removed:  4 Post-procedure details:    Procedure completion:  Tolerated well, no immediate  complications     Medications Ordered in ED Medications - No data to display  ED Course/ Medical Decision Making/ A&P  Medical Decision Making  71 yo F with a cc of needing suture removal.  Wound appears to be healing well.  Sutures removed without issue.  PCP follow-up.  9:32 AM:  I have discussed the diagnosis/risks/treatment options with the patient.  Evaluation and diagnostic testing in the emergency department does not suggest an emergent condition requiring admission or immediate intervention beyond what has been performed at this time.  They will follow up with  PCP. We also discussed returning to the ED immediately if new or worsening sx occur. We discussed the sx which are most concerning (e.g., sudden worsening pain, fever, inability to tolerate by mouth) that necessitate immediate return. Medications administered to the patient during their visit and any new prescriptions provided to the patient are listed below.  Medications given during this visit Medications - No data to display   The patient appears reasonably screen and/or stabilized for discharge and I doubt any other medical condition or other [facility] requiring further screening, evaluation, or treatment in the ED at this time prior to discharge.           Final Clinical Impression(s) / ED Diagnoses Final diagnoses:  Visit for suture removal    Rx / DC Orders ED Discharge Orders     None         Deno Etienne, DO 01/26/22 4439

## 2022-01-29 ENCOUNTER — Other Ambulatory Visit (HOSPITAL_BASED_OUTPATIENT_CLINIC_OR_DEPARTMENT_OTHER): Payer: Self-pay | Admitting: Family Medicine

## 2022-01-29 DIAGNOSIS — N951 Menopausal and female climacteric states: Secondary | ICD-10-CM

## 2022-01-31 ENCOUNTER — Other Ambulatory Visit: Payer: Medicare Other

## 2022-02-01 ENCOUNTER — Other Ambulatory Visit (HOSPITAL_BASED_OUTPATIENT_CLINIC_OR_DEPARTMENT_OTHER): Payer: Self-pay | Admitting: Nurse Practitioner

## 2022-02-01 DIAGNOSIS — Z794 Long term (current) use of insulin: Secondary | ICD-10-CM

## 2022-02-03 ENCOUNTER — Other Ambulatory Visit (HOSPITAL_BASED_OUTPATIENT_CLINIC_OR_DEPARTMENT_OTHER): Payer: Self-pay

## 2022-02-03 ENCOUNTER — Other Ambulatory Visit (HOSPITAL_BASED_OUTPATIENT_CLINIC_OR_DEPARTMENT_OTHER): Payer: Self-pay | Admitting: Nurse Practitioner

## 2022-02-03 ENCOUNTER — Ambulatory Visit (INDEPENDENT_AMBULATORY_CARE_PROVIDER_SITE_OTHER): Payer: Medicare Other | Admitting: Nurse Practitioner

## 2022-02-03 ENCOUNTER — Encounter (HOSPITAL_BASED_OUTPATIENT_CLINIC_OR_DEPARTMENT_OTHER): Payer: Self-pay | Admitting: Nurse Practitioner

## 2022-02-03 VITALS — BP 124/80 | HR 76 | Ht 66.0 in | Wt 198.4 lb

## 2022-02-03 DIAGNOSIS — E781 Pure hyperglyceridemia: Secondary | ICD-10-CM | POA: Diagnosis not present

## 2022-02-03 DIAGNOSIS — I152 Hypertension secondary to endocrine disorders: Secondary | ICD-10-CM

## 2022-02-03 DIAGNOSIS — Z794 Long term (current) use of insulin: Secondary | ICD-10-CM

## 2022-02-03 DIAGNOSIS — E1165 Type 2 diabetes mellitus with hyperglycemia: Secondary | ICD-10-CM

## 2022-02-03 DIAGNOSIS — E1159 Type 2 diabetes mellitus with other circulatory complications: Secondary | ICD-10-CM

## 2022-02-03 DIAGNOSIS — Z6832 Body mass index (BMI) 32.0-32.9, adult: Secondary | ICD-10-CM

## 2022-02-03 DIAGNOSIS — S0083XA Contusion of other part of head, initial encounter: Secondary | ICD-10-CM | POA: Diagnosis not present

## 2022-02-03 DIAGNOSIS — I10 Essential (primary) hypertension: Secondary | ICD-10-CM

## 2022-02-03 LAB — CBC WITH DIFF/PLATELET
Basophils Absolute: 0 10*3/uL (ref 0.0–0.2)
Basos: 0 %
EOS (ABSOLUTE): 0.2 10*3/uL (ref 0.0–0.4)
Eos: 3 %
Hematocrit: 42.6 % (ref 34.0–46.6)
Hemoglobin: 14.1 g/dL (ref 11.1–15.9)
Immature Grans (Abs): 0 10*3/uL (ref 0.0–0.1)
Immature Granulocytes: 0 %
Lymphocytes Absolute: 2.2 10*3/uL (ref 0.7–3.1)
Lymphs: 32 %
MCH: 28.8 pg (ref 26.6–33.0)
MCHC: 33.1 g/dL (ref 31.5–35.7)
MCV: 87 fL (ref 79–97)
Monocytes Absolute: 0.5 10*3/uL (ref 0.1–0.9)
Monocytes: 7 %
Neutrophils Absolute: 4 10*3/uL (ref 1.4–7.0)
Neutrophils: 58 %
Platelets: 305 10*3/uL (ref 150–450)
RBC: 4.9 x10E6/uL (ref 3.77–5.28)
RDW: 13.1 % (ref 11.7–15.4)
WBC: 6.9 10*3/uL (ref 3.4–10.8)

## 2022-02-03 LAB — LIPID PANEL
Chol/HDL Ratio: 3.9 ratio (ref 0.0–4.4)
Cholesterol, Total: 148 mg/dL (ref 100–199)
HDL: 38 mg/dL — ABNORMAL LOW (ref >39)
LDL Chol Calc (NIH): 72 mg/dL (ref 0–99)
Triglycerides: 229 mg/dL — ABNORMAL HIGH (ref 0–149)
VLDL Cholesterol Cal: 38 mg/dL (ref 5–40)

## 2022-02-03 LAB — COMPREHENSIVE METABOLIC PANEL
ALT: 14 IU/L (ref 0–32)
AST: 13 IU/L (ref 0–40)
Albumin/Globulin Ratio: 1.9 (ref 1.2–2.2)
Albumin: 4 g/dL (ref 3.8–4.8)
Alkaline Phosphatase: 104 IU/L (ref 44–121)
BUN/Creatinine Ratio: 20 (ref 12–28)
BUN: 18 mg/dL (ref 8–27)
Bilirubin Total: 0.4 mg/dL (ref 0.0–1.2)
CO2: 26 mmol/L (ref 20–29)
Calcium: 9.4 mg/dL (ref 8.7–10.3)
Chloride: 103 mmol/L (ref 96–106)
Creatinine, Ser: 0.91 mg/dL (ref 0.57–1.00)
Globulin, Total: 2.1 g/dL (ref 1.5–4.5)
Glucose: 169 mg/dL — ABNORMAL HIGH (ref 70–99)
Potassium: 4.5 mmol/L (ref 3.5–5.2)
Sodium: 142 mmol/L (ref 134–144)
Total Protein: 6.1 g/dL (ref 6.0–8.5)
eGFR: 69 mL/min/{1.73_m2} (ref >59)

## 2022-02-03 LAB — HEMOGLOBIN A1C
Est. average glucose Bld gHb Est-mCnc: 243 mg/dL
Hgb A1c MFr Bld: 10.1 % — ABNORMAL HIGH (ref 4.8–5.6)

## 2022-02-03 LAB — POCT UA - MICROALBUMIN
Creatinine, POC: 300 mg/dL
Microalbumin Ur, POC: 80 mg/L

## 2022-02-03 MED ORDER — ONDANSETRON HCL 8 MG PO TABS
4.0000 mg | ORAL_TABLET | Freq: Three times a day (TID) | ORAL | 3 refills | Status: DC | PRN
  Filled 2022-02-03 – 2022-03-27 (×2): qty 30, 20d supply, fill #0

## 2022-02-03 MED ORDER — SEMAGLUTIDE(0.25 OR 0.5MG/DOS) 2 MG/3ML ~~LOC~~ SOPN
0.5000 mg | PEN_INJECTOR | SUBCUTANEOUS | 3 refills | Status: DC
  Filled 2022-02-03: qty 9, 84d supply, fill #0
  Filled 2022-03-27: qty 3, 28d supply, fill #0

## 2022-02-03 NOTE — Progress Notes (Signed)
[name], DNP, AGNP-c [place],  [id number] [phone] Office [phone] Fax  ESTABLISHED PATIENT- Chronic Health and/or Follow-Up Visit  Blood pressure 124/80, pulse 76, height '5\' 6"'$  (1.676 m), weight 198 lb 6.4 oz (90 kg), SpO2 97 %.  Follow-up (Pt here for f/u she also said she fractured her nose from a fell)   HPI  Kendra Miles  is a 71 y.o. year old female presenting today for evaluation and management of the following: DM Kendra Miles tells me today that her blood sugars have not been very well controlled recently  They have been averaging around 200 She reports that she has not been very diligent or consistent with her diet. She is taking her medication routinely without missed doses She denies skin infections, increased hunger, increased thirst, increased urination. She expresses frustration today over the difficulty controlling her blood sugars. Hypertension Blood pressure initially elevated in the office today.  Repeat exam shows that blood pressure has normalized. No headache, shortness of breath, chest pain, palpitations, paresthesia, edema. She is taking her medication as prescribed without missed doses Fall Today's tells me that she recently fell after losing her footing and landed on her face The fall resulted in a fracture to her nasal bone She reports that the symptoms are significantly improving at this time she was seen in the emergency room-she was out of [place] when this happened She denies any headaches, vision changes, severe pain in the sinuses   ROS All ROS negative with exception of what is listed in HPI  PHYSICAL EXAM Physical Exam Vitals and nursing note reviewed.  Constitutional:      General: She is not in acute distress.    Appearance: Normal appearance.  HENT:     Head: Raccoon eyes and contusion present.     Nose: Signs of injury present.  Eyes:      Extraocular Movements: Extraocular movements intact.     Conjunctiva/sclera: Conjunctivae normal.     Pupils: Pupils are equal, round, and reactive to light.  Neck:     Vascular: No carotid bruit.  Cardiovascular:     Rate and Rhythm: Normal rate and regular rhythm.     Pulses: Normal pulses.     Heart sounds: Normal heart sounds. No murmur heard. Pulmonary:     Effort: Pulmonary effort is normal.     Breath sounds: Normal breath sounds. No wheezing.  Abdominal:     General: Bowel sounds are normal. There is no distension.     Palpations: Abdomen is soft.     Tenderness: There is no abdominal tenderness. There is no guarding.  Musculoskeletal:        General: Normal range of motion.     Cervical back: Normal range of motion and neck supple.     Right lower leg: No edema.     Left lower leg: No edema.  Lymphadenopathy:     Cervical: No cervical adenopathy.  Skin:    General: Skin is warm and dry.     Capillary Refill: Capillary refill takes less than 2 seconds.  Neurological:     General: No focal deficit present.     Mental Status: She is alert and oriented to person, place, and time.  Psychiatric:        Mood and Affect: Mood normal.        Behavior: Behavior normal.        Thought Content: Thought content normal.  Judgment: Judgment normal.     ASSESSMENT & PLAN Problem List Items Addressed This Visit     Hypertension associated with diabetes ([place])    Chronic.  Initially elevated during the visit today however repeat examination at the end of the visit shows that it is returned to baseline.  Encourage patient to continue with current medication regimen.  No alarm symptoms present at this time.  We will obtain labs today for evaluation and make recommendations for changes in plan of care based on findings as appropriate.  Follow-up in approximately 3 months or sooner if needed.      Relevant Medications   Semaglutide,0.25 or 0.'5MG'$ /DOS, 2 MG/3ML SOPN   Other  Relevant Orders   Hemoglobin A1c (Completed)   HM DIABETES FOOT EXAM (Completed)   Lipid panel (Completed)   POCT UA - Microalbumin (Completed)   Comprehensive metabolic panel (Completed)   CBC With Diff/Platelet (Completed)   Type 2 diabetes mellitus with hyperglycemia, with long-term current use of insulin (HCC) - Primary    Chronic.  Not well controlled at this time.  Will obtain labs for A1c however based on her fasting blood sugar levels I do expect this to be high.  At this time I am not comfortable increasing the patient's glipizide as this does carry an increased risk of hypoglycemia.  I do feel that she would benefit greatly from a GLP-1 medication as this would also help with hypertension, obesity, hyperlipidemia, and her diabetes.  Discussed option with patient today and she is in agreement to trial this medication if it is covered by her insurance.  We will send medication in.  Encourage patient to continue with the metformin and glipizide on the starter dose for the first 4 weeks that she may stop the glipizide once she reaches the 0.5 mg dose after 4 weeks.  Recommend continuing monitoring blood sugars on a regular basis and watching her diet.  Recommend carbohydrate intake between 150 and 180 mg/day.  Also encourage patient to increase physical activity as tolerated.      Relevant Medications   Semaglutide,0.25 or 0.'5MG'$ /DOS, 2 MG/3ML SOPN   ondansetron (ZOFRAN) 8 MG tablet   Other Relevant Orders   Hemoglobin A1c (Completed)   HM DIABETES FOOT EXAM (Completed)   Lipid panel (Completed)   POCT UA - Microalbumin (Completed)   Comprehensive metabolic panel (Completed)   CBC With Diff/Platelet (Completed)   High blood triglycerides    Triglycerides significantly elevated on labs.  We are starting semaglutide today which should help with triglycerides and blood sugars.  We will plan to monitor in about 3 months after patient has had opportunity to benefit from the medication.  If  levels remain elevated will consider an additional treatment options.      Relevant Medications   Semaglutide,0.25 or 0.'5MG'$ /DOS, 2 MG/3ML SOPN   Other Relevant Orders   Hemoglobin A1c (Completed)   HM DIABETES FOOT EXAM (Completed)   Lipid panel (Completed)   POCT UA - Microalbumin (Completed)   Comprehensive metabolic panel (Completed)   CBC With Diff/Platelet (Completed)   BMI 32.0-32.9,adult    Chronic.  In the setting of diabetes, hyperlipidemia, hypertension patient is at significant increased risk of cardiovascular events.  I do recommend that we start GLP-1 medication with semaglutide today to help control blood sugars and prevent worsening of her cardiovascular status.  Patient is in agreement to this.  Strongly encourage daily activity of at least 10 minutes walking daily and slowly working up to  longer distance and longer time.  We will plan to follow-up in 3 months or sooner if needed.      Relevant Medications   Semaglutide,0.25 or 0.'5MG'$ /DOS, 2 MG/3ML SOPN   Other Relevant Orders   Hemoglobin A1c (Completed)   HM DIABETES FOOT EXAM (Completed)   Lipid panel (Completed)   POCT UA - Microalbumin (Completed)   Comprehensive metabolic panel (Completed)   CBC With Diff/Platelet (Completed)   Contusion of face    Recent fall with contusion to face resulting in bilateral ecchymosis to the eyes and bridge of the nose.  Bridge of the nose has been fractured.  She does appear to be improving and symptoms are resolving at this time.  There are no new or alarm symptoms present.  Encouraged the patient to continue to monitor her symptoms and report immediately if she begins to develop new or worsening headache, uncontrolled bleeding from the nose, or new or worsening bruising.        FOLLOW-UP Return in about 3 months (around 05/06/2022) for DM, HTN.   [name], DNP, AGNP-c 02/03/2022  8:49 AM

## 2022-02-03 NOTE — Patient Instructions (Addendum)
We are going to start [place] for your diabetes. This medication is injected into the skin once a week.  While you are on this medication, be sure you are eating small meals- big meals can make you feel sick to your stomach. I have sent in zofran in case you have upset stomach or nausea.     Continue your metformin and glipizide for the first 4 weeks.  On week 5, only take 1 dose of glipizide a day and you may continue the metformin if you still have this available.  When you reach week 9, stop the glipizide. It is ok if you want to continue the metformin, but you do not have to.

## 2022-02-14 ENCOUNTER — Other Ambulatory Visit (HOSPITAL_BASED_OUTPATIENT_CLINIC_OR_DEPARTMENT_OTHER): Payer: Self-pay

## 2022-02-17 ENCOUNTER — Encounter (HOSPITAL_BASED_OUTPATIENT_CLINIC_OR_DEPARTMENT_OTHER): Payer: Self-pay | Admitting: Nurse Practitioner

## 2022-02-17 DIAGNOSIS — Z6832 Body mass index (BMI) 32.0-32.9, adult: Secondary | ICD-10-CM | POA: Insufficient documentation

## 2022-02-17 DIAGNOSIS — S0083XA Contusion of other part of head, initial encounter: Secondary | ICD-10-CM | POA: Insufficient documentation

## 2022-02-17 HISTORY — DX: Contusion of other part of head, initial encounter: S00.83XA

## 2022-02-17 NOTE — Assessment & Plan Note (Signed)
Triglycerides significantly elevated on labs.  We are starting semaglutide today which should help with triglycerides and blood sugars.  We will plan to monitor in about 3 months after patient has had opportunity to benefit from the medication.  If levels remain elevated will consider an additional treatment options.

## 2022-02-17 NOTE — Assessment & Plan Note (Signed)
Chronic.  In the setting of diabetes, hyperlipidemia, hypertension patient is at significant increased risk of cardiovascular events.  I do recommend that we start GLP-1 medication with semaglutide today to help control blood sugars and prevent worsening of her cardiovascular status.  Patient is in agreement to this.  Strongly encourage daily activity of at least 10 minutes walking daily and slowly working up to longer distance and longer time.  We will plan to follow-up in 3 months or sooner if needed.

## 2022-02-17 NOTE — Assessment & Plan Note (Signed)
Chronic.  Initially elevated during the visit today however repeat examination at the end of the visit shows that it is returned to baseline.  Encourage patient to continue with current medication regimen.  No alarm symptoms present at this time.  We will obtain labs today for evaluation and make recommendations for changes in plan of care based on findings as appropriate.  Follow-up in approximately 3 months or sooner if needed.

## 2022-02-17 NOTE — Assessment & Plan Note (Signed)
Recent fall with contusion to face resulting in bilateral ecchymosis to the eyes and bridge of the nose.  Bridge of the nose has been fractured.  She does appear to be improving and symptoms are resolving at this time.  There are no new or alarm symptoms present.  Encouraged the patient to continue to monitor her symptoms and report immediately if she begins to develop new or worsening headache, uncontrolled bleeding from the nose, or new or worsening bruising.

## 2022-02-17 NOTE — Assessment & Plan Note (Signed)
Chronic.  Not well controlled at this time.  Will obtain labs for A1c however based on her fasting blood sugar levels I do expect this to be high.  At this time I am not comfortable increasing the patient's glipizide as this does carry an increased risk of hypoglycemia.  I do feel that she would benefit greatly from a GLP-1 medication as this would also help with hypertension, obesity, hyperlipidemia, and her diabetes.  Discussed option with patient today and she is in agreement to trial this medication if it is covered by her insurance.  We will send medication in.  Encourage patient to continue with the metformin and glipizide on the starter dose for the first 4 weeks that she may stop the glipizide once she reaches the 0.5 mg dose after 4 weeks.  Recommend continuing monitoring blood sugars on a regular basis and watching her diet.  Recommend carbohydrate intake between 150 and 180 mg/day.  Also encourage patient to increase physical activity as tolerated.

## 2022-02-24 ENCOUNTER — Other Ambulatory Visit (HOSPITAL_BASED_OUTPATIENT_CLINIC_OR_DEPARTMENT_OTHER): Payer: Self-pay | Admitting: Nurse Practitioner

## 2022-02-24 DIAGNOSIS — G8929 Other chronic pain: Secondary | ICD-10-CM

## 2022-03-21 DIAGNOSIS — H2513 Age-related nuclear cataract, bilateral: Secondary | ICD-10-CM | POA: Diagnosis not present

## 2022-03-21 DIAGNOSIS — H25013 Cortical age-related cataract, bilateral: Secondary | ICD-10-CM | POA: Diagnosis not present

## 2022-03-21 DIAGNOSIS — H18413 Arcus senilis, bilateral: Secondary | ICD-10-CM | POA: Diagnosis not present

## 2022-03-21 DIAGNOSIS — H25043 Posterior subcapsular polar age-related cataract, bilateral: Secondary | ICD-10-CM | POA: Diagnosis not present

## 2022-03-21 DIAGNOSIS — H2512 Age-related nuclear cataract, left eye: Secondary | ICD-10-CM | POA: Diagnosis not present

## 2022-03-27 ENCOUNTER — Other Ambulatory Visit (HOSPITAL_BASED_OUTPATIENT_CLINIC_OR_DEPARTMENT_OTHER): Payer: Self-pay

## 2022-04-23 ENCOUNTER — Other Ambulatory Visit (HOSPITAL_BASED_OUTPATIENT_CLINIC_OR_DEPARTMENT_OTHER): Payer: Self-pay | Admitting: Nurse Practitioner

## 2022-04-23 DIAGNOSIS — E1165 Type 2 diabetes mellitus with hyperglycemia: Secondary | ICD-10-CM

## 2022-04-30 ENCOUNTER — Other Ambulatory Visit (HOSPITAL_BASED_OUTPATIENT_CLINIC_OR_DEPARTMENT_OTHER): Payer: Self-pay | Admitting: Nurse Practitioner

## 2022-04-30 DIAGNOSIS — E1165 Type 2 diabetes mellitus with hyperglycemia: Secondary | ICD-10-CM

## 2022-05-05 ENCOUNTER — Other Ambulatory Visit (HOSPITAL_BASED_OUTPATIENT_CLINIC_OR_DEPARTMENT_OTHER): Payer: Self-pay

## 2022-05-05 ENCOUNTER — Encounter (HOSPITAL_BASED_OUTPATIENT_CLINIC_OR_DEPARTMENT_OTHER): Payer: Self-pay | Admitting: Nurse Practitioner

## 2022-05-05 ENCOUNTER — Ambulatory Visit (INDEPENDENT_AMBULATORY_CARE_PROVIDER_SITE_OTHER): Payer: Medicare Other | Admitting: Nurse Practitioner

## 2022-05-05 VITALS — BP 150/72 | HR 83 | Ht 66.0 in | Wt 196.6 lb

## 2022-05-05 DIAGNOSIS — Z6832 Body mass index (BMI) 32.0-32.9, adult: Secondary | ICD-10-CM | POA: Diagnosis not present

## 2022-05-05 DIAGNOSIS — E1165 Type 2 diabetes mellitus with hyperglycemia: Secondary | ICD-10-CM | POA: Diagnosis not present

## 2022-05-05 DIAGNOSIS — I152 Hypertension secondary to endocrine disorders: Secondary | ICD-10-CM | POA: Diagnosis not present

## 2022-05-05 DIAGNOSIS — K219 Gastro-esophageal reflux disease without esophagitis: Secondary | ICD-10-CM

## 2022-05-05 DIAGNOSIS — E781 Pure hyperglyceridemia: Secondary | ICD-10-CM

## 2022-05-05 DIAGNOSIS — Z794 Long term (current) use of insulin: Secondary | ICD-10-CM | POA: Diagnosis not present

## 2022-05-05 DIAGNOSIS — E1159 Type 2 diabetes mellitus with other circulatory complications: Secondary | ICD-10-CM | POA: Diagnosis not present

## 2022-05-05 DIAGNOSIS — L739 Follicular disorder, unspecified: Secondary | ICD-10-CM

## 2022-05-05 LAB — POCT GLYCOSYLATED HEMOGLOBIN (HGB A1C): Hemoglobin A1C: 10.3 % — AB (ref 4.0–5.6)

## 2022-05-05 MED ORDER — TRIAMCINOLONE ACETONIDE 0.1 % EX CREA: TOPICAL_CREAM | Freq: Two times a day (BID) | CUTANEOUS | 1 refills | Status: DC

## 2022-05-05 MED ORDER — SEMAGLUTIDE (1 MG/DOSE) 4 MG/3ML ~~LOC~~ SOPN
1.0000 mg | PEN_INJECTOR | SUBCUTANEOUS | 3 refills | Status: DC
  Filled 2022-05-05: qty 9, 84d supply, fill #0

## 2022-05-05 MED ORDER — FLUCONAZOLE 150 MG PO TABS: ORAL_TABLET | ORAL | 2 refills | Status: DC

## 2022-05-05 MED ORDER — LISINOPRIL 20 MG PO TABS: 20.0000 mg | ORAL_TABLET | Freq: Every day | ORAL | 3 refills | Status: DC

## 2022-05-05 MED ORDER — DOXYCYCLINE HYCLATE 100 MG PO TABS: 100.0000 mg | ORAL_TABLET | Freq: Two times a day (BID) | ORAL | 0 refills | Status: DC

## 2022-05-05 MED ORDER — PANTOPRAZOLE SODIUM 40 MG PO TBEC: 40.0000 mg | DELAYED_RELEASE_TABLET | Freq: Two times a day (BID) | ORAL | 3 refills | Status: DC

## 2022-05-05 NOTE — Progress Notes (Signed)
Kendra Keeler, DNP, AGNP-c [address] [place], [place] [address] [phone] Office [phone] Fax  ESTABLISHED PATIENT- Chronic Health and/or Follow-Up Visit  Blood pressure (!) 150/72, pulse 83, height '5\' 6"'$  (1.676 m), weight 196 lb 9.6 oz (89.2 kg), SpO2 95 %.    Kendra Miles is a 71 y.o. year old female presenting today for evaluation and management of the following: Follow-up (Patient is still having high blood sugar and high BP. Patient is having itching in vagina)   1. Type 2 diabetes mellitus with hyperglycemia, with long-term current use of insulin (HCC) Her blood sugars are not well controlled She has stopped the glipizide and metformin and is currently taking Ozempic 0.'5mg'$  weekly. She is not having side effects of the medication She is not having any difficulty administering the medication or obtaining the medication.  She is having some vaginal itching  She is not having any vaginal discharge She tells me that she has a little bump on the labia that is itching She does wear a bladder pad, but has worn that for a long period of time  Her mother passed suddenly on July 21st of this year after a fall and head injury  Having cataract surgery 10/30  All ROS negative with exception of what is listed above.   PHYSICAL EXAM Physical Exam Vitals and nursing note reviewed.  Constitutional:      General: She is not in acute distress.    Appearance: Normal appearance.  HENT:     Head: Normocephalic.  Eyes:     Extraocular Movements: Extraocular movements intact.     Conjunctiva/sclera: Conjunctivae normal.     Pupils: Pupils are equal, round, and reactive to light.  Neck:     Vascular: No carotid bruit.  Cardiovascular:     Rate and Rhythm: Normal rate and regular rhythm.     Pulses: Normal pulses.     Heart sounds: Normal heart sounds. No murmur heard. Pulmonary:     Effort: Pulmonary effort is  normal.     Breath sounds: Normal breath sounds. No wheezing.  Abdominal:     General: Bowel sounds are normal. There is no distension.     Palpations: Abdomen is soft.     Tenderness: There is no abdominal tenderness. There is no guarding.  Musculoskeletal:        General: Normal range of motion.     Cervical back: Normal range of motion and neck supple.     Right lower leg: No edema.     Left lower leg: No edema.  Lymphadenopathy:     Cervical: No cervical adenopathy.  Skin:    General: Skin is warm and dry.     Capillary Refill: Capillary refill takes less than 2 seconds.  Neurological:     General: No focal deficit present.     Mental Status: She is alert and oriented to person, place, and time.  Psychiatric:        Mood and Affect: Mood normal.        Behavior: Behavior normal.        Thought Content: Thought content normal.        Judgment: Judgment normal.     PLAN Problem List Items Addressed This Visit     Hypertension associated with diabetes ([place])    Chronic.  Blood pressures are elevated in the office today.  No alarm symptoms are present at this time.  She is taking  medication as prescribed without missed doses.  Blood pressures outside of the office are reportedly normal.  Recommend continued monitoring at home and notify immediately if blood pressures are running greater than 140/90 on a regular basis.  No changes to medication at this time we will continue to monitor.      Relevant Medications   Semaglutide, 1 MG/DOSE, 4 MG/3ML SOPN   lisinopril (ZESTRIL) 20 MG tablet   Type 2 diabetes mellitus with hyperglycemia, with long-term current use of insulin (HCC) - Primary    Chronic.  A1c is not at goal at this time.  At this time recommend increase Ozempic to 1 mg dose.  We will go ahead and restart the metformin in the evenings for added control while we continue to try to get better control of her blood sugars.  We will reevaluate if she can come off of this in 3  months.      Relevant Medications   Semaglutide, 1 MG/DOSE, 4 MG/3ML SOPN   lisinopril (ZESTRIL) 20 MG tablet   Other Relevant Orders   POCT glycosylated hemoglobin (Hb A1C) (Completed)   GERD (gastroesophageal reflux disease)    Chronic.  Stable.  Refills provided.      Relevant Medications   pantoprazole (PROTONIX) 40 MG tablet   High blood triglycerides    Monitoring today.      Relevant Medications   lisinopril (ZESTRIL) 20 MG tablet   BMI 32.0-32.9,adult    Labs today.  Increase Ozempic to 1 mg.  Recommend increased physical activity.      Other Visit Diagnoses     Folliculitis       Relevant Medications   triamcinolone cream (KENALOG) 0.1 %   doxycycline (VIBRA-TABS) 100 MG tablet   fluconazole (DIFLUCAN) 150 MG tablet      3 months   Kendra Joel [place], DNP, AGNP-c 05/05/2022 10:15 AM

## 2022-05-05 NOTE — Patient Instructions (Addendum)
I have increased the Ozempic to 1 mg once a week.  I also want you to restart one metformin for now. We will see if we can come off of it in 3 months  I have increased the lisinopril to '20mg'$  a day.  I have sent in '20mg'$ , but you can take 2 of the '10mg'$  tablets you have at home until you use those up if you would like.   I am so sorry about your momma. I know this has been a hard summer

## 2022-05-20 NOTE — Assessment & Plan Note (Signed)
Chronic.  Stable.  Refills provided.

## 2022-05-20 NOTE — Assessment & Plan Note (Signed)
Labs today.  Increase Ozempic to 1 mg.  Recommend increased physical activity.

## 2022-05-20 NOTE — Assessment & Plan Note (Signed)
Chronic.  A1c is not at goal at this time.  At this time recommend increase Ozempic to 1 mg dose.  We will go ahead and restart the metformin in the evenings for added control while we continue to try to get better control of her blood sugars.  We will reevaluate if she can come off of this in 3 months.

## 2022-05-20 NOTE — Assessment & Plan Note (Signed)
Monitoring today.

## 2022-05-20 NOTE — Assessment & Plan Note (Signed)
Chronic.  Blood pressures are elevated in the office today.  No alarm symptoms are present at this time.  She is taking medication as prescribed without missed doses.  Blood pressures outside of the office are reportedly normal.  Recommend continued monitoring at home and notify immediately if blood pressures are running greater than 140/90 on a regular basis.  No changes to medication at this time we will continue to monitor.

## 2022-06-06 ENCOUNTER — Encounter (HOSPITAL_BASED_OUTPATIENT_CLINIC_OR_DEPARTMENT_OTHER): Payer: Medicare Other | Admitting: Nurse Practitioner

## 2022-06-09 ENCOUNTER — Encounter (HOSPITAL_BASED_OUTPATIENT_CLINIC_OR_DEPARTMENT_OTHER): Payer: Medicare Other | Admitting: Nurse Practitioner

## 2022-06-15 DIAGNOSIS — Z23 Encounter for immunization: Secondary | ICD-10-CM | POA: Diagnosis not present

## 2022-06-16 ENCOUNTER — Encounter (HOSPITAL_BASED_OUTPATIENT_CLINIC_OR_DEPARTMENT_OTHER): Payer: Medicare Other | Admitting: Nurse Practitioner

## 2022-06-19 DIAGNOSIS — H2512 Age-related nuclear cataract, left eye: Secondary | ICD-10-CM | POA: Diagnosis not present

## 2022-06-20 DIAGNOSIS — H2511 Age-related nuclear cataract, right eye: Secondary | ICD-10-CM | POA: Diagnosis not present

## 2022-07-17 ENCOUNTER — Encounter (HOSPITAL_BASED_OUTPATIENT_CLINIC_OR_DEPARTMENT_OTHER): Payer: Medicare Other

## 2022-07-17 DIAGNOSIS — H2511 Age-related nuclear cataract, right eye: Secondary | ICD-10-CM | POA: Diagnosis not present

## 2022-07-18 DIAGNOSIS — H2511 Age-related nuclear cataract, right eye: Secondary | ICD-10-CM | POA: Diagnosis not present

## 2022-08-04 ENCOUNTER — Ambulatory Visit (HOSPITAL_BASED_OUTPATIENT_CLINIC_OR_DEPARTMENT_OTHER): Payer: Medicare Other | Admitting: Nurse Practitioner

## 2022-10-22 ENCOUNTER — Other Ambulatory Visit: Payer: Self-pay | Admitting: Internal Medicine

## 2022-11-15 ENCOUNTER — Other Ambulatory Visit: Payer: Self-pay | Admitting: Internal Medicine

## 2022-11-21 ENCOUNTER — Telehealth: Payer: Self-pay | Admitting: Nurse Practitioner

## 2022-11-21 NOTE — Telephone Encounter (Signed)
Called patient to schedule Medicare Annual Wellness Visit (AWV). Left message for patient to call back and schedule Medicare Annual Wellness Visit (AWV).  Last date of AWV: awvS 09/21/21 per [facility]   Please schedule an appointment at any time with [facility].  If any questions, please contact me at [phone].  Thank you ,  Shirlean Mylar [phone]

## 2022-11-27 ENCOUNTER — Other Ambulatory Visit: Payer: Self-pay | Admitting: Internal Medicine

## 2022-12-16 ENCOUNTER — Other Ambulatory Visit: Payer: Self-pay | Admitting: Internal Medicine

## 2022-12-30 ENCOUNTER — Other Ambulatory Visit: Payer: Self-pay | Admitting: Internal Medicine

## 2023-01-18 ENCOUNTER — Other Ambulatory Visit (HOSPITAL_BASED_OUTPATIENT_CLINIC_OR_DEPARTMENT_OTHER): Payer: Self-pay | Admitting: Nurse Practitioner

## 2023-01-18 DIAGNOSIS — N951 Menopausal and female climacteric states: Secondary | ICD-10-CM

## 2023-01-18 NOTE — Telephone Encounter (Signed)
Refill request last apt 05/05/22. Pt. Sent MyChart message that she is due for an apt.

## 2023-01-29 ENCOUNTER — Other Ambulatory Visit: Payer: Self-pay | Admitting: Internal Medicine

## 2023-03-12 ENCOUNTER — Other Ambulatory Visit: Payer: Self-pay | Admitting: Internal Medicine

## 2023-03-12 ENCOUNTER — Other Ambulatory Visit (HOSPITAL_BASED_OUTPATIENT_CLINIC_OR_DEPARTMENT_OTHER): Payer: Self-pay | Admitting: Nurse Practitioner

## 2023-03-12 DIAGNOSIS — I152 Hypertension secondary to endocrine disorders: Secondary | ICD-10-CM

## 2023-03-22 ENCOUNTER — Other Ambulatory Visit: Payer: Self-pay

## 2023-03-22 ENCOUNTER — Emergency Department (HOSPITAL_BASED_OUTPATIENT_CLINIC_OR_DEPARTMENT_OTHER): Payer: Medicare Other

## 2023-03-22 ENCOUNTER — Encounter (HOSPITAL_BASED_OUTPATIENT_CLINIC_OR_DEPARTMENT_OTHER): Payer: Self-pay | Admitting: Emergency Medicine

## 2023-03-22 ENCOUNTER — Emergency Department (HOSPITAL_BASED_OUTPATIENT_CLINIC_OR_DEPARTMENT_OTHER)
Admission: EM | Admit: 2023-03-22 | Discharge: 2023-03-22 | Disposition: A | Discharge status: Home / Self Care | Payer: Medicare Other | Attending: Emergency Medicine | Admitting: Emergency Medicine

## 2023-03-22 DIAGNOSIS — R1030 Lower abdominal pain, unspecified: Secondary | ICD-10-CM | POA: Diagnosis present

## 2023-03-22 DIAGNOSIS — N3001 Acute cystitis with hematuria: Secondary | ICD-10-CM | POA: Diagnosis not present

## 2023-03-22 DIAGNOSIS — Z7982 Long term (current) use of aspirin: Secondary | ICD-10-CM | POA: Diagnosis not present

## 2023-03-22 DIAGNOSIS — Z7984 Long term (current) use of oral hypoglycemic drugs: Secondary | ICD-10-CM | POA: Insufficient documentation

## 2023-03-22 DIAGNOSIS — D72829 Elevated white blood cell count, unspecified: Secondary | ICD-10-CM | POA: Insufficient documentation

## 2023-03-22 DIAGNOSIS — E1165 Type 2 diabetes mellitus with hyperglycemia: Secondary | ICD-10-CM | POA: Diagnosis not present

## 2023-03-22 LAB — COMPREHENSIVE METABOLIC PANEL
ALT: 13 U/L (ref 0–44)
AST: 12 U/L — ABNORMAL LOW (ref 15–41)
Albumin: 4.1 g/dL (ref 3.5–5.0)
Alkaline Phosphatase: 128 U/L — ABNORMAL HIGH (ref 38–126)
Anion gap: 10 (ref 5–15)
BUN: 11 mg/dL (ref 8–23)
CO2: 30 mmol/L (ref 22–32)
Calcium: 9.4 mg/dL (ref 8.9–10.3)
Chloride: 100 mmol/L (ref 98–111)
Creatinine, Ser: 0.84 mg/dL (ref 0.44–1.00)
GFR, Estimated: >60 mL/min (ref >60)
Glucose, Bld: 190 mg/dL — ABNORMAL HIGH (ref 70–99)
Potassium: 3.5 mmol/L (ref 3.5–5.1)
Sodium: 140 mmol/L (ref 135–145)
Total Bilirubin: 0.7 mg/dL (ref 0.3–1.2)
Total Protein: 6.7 g/dL (ref 6.5–8.1)

## 2023-03-22 LAB — LIPASE, BLOOD: Lipase: 12 U/L (ref 11–51)

## 2023-03-22 LAB — CBC WITH DIFFERENTIAL/PLATELET
Abs Immature Granulocytes: 0.04 10*3/uL (ref 0.00–0.07)
Basophils Absolute: 0.1 10*3/uL (ref 0.0–0.1)
Basophils Relative: 0 %
Eosinophils Absolute: 0.2 10*3/uL (ref 0.0–0.5)
Eosinophils Relative: 1 %
HCT: 42.2 % (ref 36.0–46.0)
Hemoglobin: 14.3 g/dL (ref 12.0–15.0)
Immature Granulocytes: 0 %
Lymphocytes Relative: 16 %
Lymphs Abs: 2.2 10*3/uL (ref 0.7–4.0)
MCH: 29.7 pg (ref 26.0–34.0)
MCHC: 33.9 g/dL (ref 30.0–36.0)
MCV: 87.7 fL (ref 80.0–100.0)
Monocytes Absolute: 1 10*3/uL (ref 0.1–1.0)
Monocytes Relative: 7 %
Neutro Abs: 10.5 10*3/uL — ABNORMAL HIGH (ref 1.7–7.7)
Neutrophils Relative %: 76 %
Platelets: 323 10*3/uL (ref 150–400)
RBC: 4.81 MIL/uL (ref 3.87–5.11)
RDW: 13.2 % (ref 11.5–15.5)
WBC: 13.9 10*3/uL — ABNORMAL HIGH (ref 4.0–10.5)
nRBC: 0 % (ref 0.0–0.2)

## 2023-03-22 LAB — URINALYSIS, ROUTINE W REFLEX MICROSCOPIC

## 2023-03-22 LAB — URINALYSIS, MICROSCOPIC (REFLEX)
RBC / HPF: >50 RBC/hpf (ref 0–5)
WBC, UA: >50 WBC/hpf (ref 0–5)

## 2023-03-22 MED ORDER — CEPHALEXIN 250 MG PO CAPS
500.0000 mg | ORAL_CAPSULE | Freq: Once | ORAL | Status: AC
  Administered 2023-03-22: 500 mg via ORAL
  Filled 2023-03-22: qty 2

## 2023-03-22 MED ORDER — IOHEXOL 300 MG/ML  SOLN
100.0000 mL | Freq: Once | INTRAMUSCULAR | Status: AC | PRN
  Administered 2023-03-22: 85 mL via INTRAVENOUS

## 2023-03-22 MED ORDER — CEPHALEXIN 500 MG PO CAPS: 500.0000 mg | ORAL_CAPSULE | Freq: Two times a day (BID) | ORAL | 0 refills | Status: AC

## 2023-03-22 NOTE — ED Triage Notes (Signed)
Patient reports lower abdominal pain since Tuesday, worse today.  Patient denies n/v/d.  Patient endorses frequent urination.

## 2023-03-22 NOTE — ED Notes (Signed)
Ambulated to restroom  

## 2023-03-22 NOTE — ED Notes (Signed)
Unable to get blood/IV x 2.

## 2023-03-22 NOTE — Discharge Instructions (Addendum)
It was a pleasure taking care of you today!   Your labs showed elevated white count. Your CT was notable for likely bladder infection. You will be sent a prescription for Keflex, take as prescribed. Ensure that you complete the entire course of antibiotic. Ensure to maintain fluid intake.  You may follow-up with your primary care provider as needed.  Return to the emergency department if you are experiencing increasing or worsening abdominal pain, urinary symptoms, fever, or decreased fluid intake.

## 2023-03-22 NOTE — ED Provider Notes (Signed)
[employer] EMERGENCY DEPARTMENT AT [facility] Provider Note   CSN: [id number] Arrival date & time: 03/22/23  1548     History  Chief Complaint  Patient presents with   Abdominal Pain    Kendra Miles is a 72 y.o. female with a PMHx of hyperlipidemia, diabetes who presents to the ED with concerns for lower abdominal cramping onset 3 days. Notes that her symptoms started with lower abdominal cramping. Notes that she wears bladder pads and noted hematuria. These symptoms resolved yesterday. However, notes that the cramping was worse today. Hasn't had any bleeding since yesterday that she noted. Hasn't taken any AZO. Denies history of kidney stones. Notes remote history of recurrent UTIs. Also notes history of gallstones. Tried ibuprofen without relief of her symptoms. Denies back pain, nausea, vomiting, diarrhea, constipation, fever, chest pain, shortness of breath.   The history is provided by the patient. No language interpreter was used.       Home Medications Prior to Admission medications   Medication Sig Start Date End Date Taking? Authorizing Provider  cephALEXin (KEFLEX) 500 MG capsule Take 1 capsule (500 mg total) by mouth 2 (two) times daily for 7 days. 03/22/23 03/29/23 Yes ,  A, PA-C  aspirin 81 MG chewable tablet Chew 1 tablet (81 mg total) by mouth daily. 11/02/20   Tollie Eth, NP  blood glucose meter kit and supplies Testing at least once per day (morning fasting), with option to test up to 4 times per day (morning fasting and as needed for symptoms) as needed for better control. Use only the strips that come in this kit with this meter- other strips will not work properly. 11/02/20   Tollie Eth, NP  calcium carbonate (OSCAL) 1500 (600 Ca) MG TABS tablet Take 0.4 tablets (600 mg total) by mouth daily. 11/02/20   Tollie Eth, NP  doxycycline (VIBRA-TABS) 100 MG tablet Take 1 tablet (100 mg total) by mouth 2 (two) times daily. 05/05/22   Tollie Eth, NP   emollient lotion Apply topically 2 (two) times daily as needed. Apply to legs as needed for itching and dryness. 11/02/20   Tollie Eth, NP  fluconazole (DIFLUCAN) 150 MG tablet Take one tablet by mouth at the first sign of symptoms of yeast. If no resolution, repeat dose in 72 hours. 05/05/22   Tollie Eth, NP  fluticasone (FLONASE) 50 MCG/ACT nasal spray SPRAY 2 SPRAYS INTO EACH NOSTRIL EVERY DAY 12/15/21   Early, Sung Amabile, NP  lisinopril (ZESTRIL) 20 MG tablet TAKE 1 TABLET BY MOUTH EVERY DAY 03/13/23   Early, Sung Amabile, NP  loratadine (CLARITIN) 10 MG tablet Take 1 tablet (10 mg total) by mouth daily. 02/04/21   Tollie Eth, NP  meloxicam (MOBIC) 7.5 MG tablet TAKE 1 TABLET BY MOUTH EVERY DAY 03/03/22   Early, Sung Amabile, NP  metFORMIN (GLUCOPHAGE) 500 MG tablet TAKE 1 TABLET BY MOUTH TWICE A DAY WITH FOOD 05/01/22   Early, Sung Amabile, NP  Multiple Vitamin (MULTIVITAMIN WITH MINERALS) TABS tablet Take 1 tablet by mouth daily. Women's 50+ Multivitamin    [provider]  Omega-3 Fatty Acids (FISH OIL PO) Take 2,000 mg by mouth daily.    [provider]  ondansetron (ZOFRAN) 8 MG tablet Take 0.5 tablets (4 mg total) by mouth every 8 (eight) hours as needed for nausea or vomiting. 02/03/22   Early, Sung Amabile, NP  pantoprazole (PROTONIX) 40 MG tablet Take 1 tablet (40 mg total)  by mouth 2 (two) times daily. 05/05/22   Tollie Eth, NP  PARoxetine (PAXIL) 30 MG tablet TAKE 1 TABLET BY MOUTH EVERY DAY 01/18/23   Early, Sung Amabile, NP  rizatriptan (MAXALT) 10 MG tablet Take 1 tablet (10 mg total) by mouth as needed for migraine. May repeat in 2 hours if needed. Do not take more than 2 doses in 24 hours. 07/20/21   Tollie Eth, NP  rosuvastatin (CRESTOR) 20 MG tablet Take 1 tablet (20 mg total) by mouth daily. Please call office to schedule an appt for further refills. Thank you 03/13/23   Christell Constant, MD  Semaglutide, 1 MG/DOSE, 4 MG/3ML SOPN Inject 1 mg as directed once a week. 05/05/22   Tollie Eth, NP  triamcinolone cream (KENALOG) 0.1 % Apply topically 2 (two) times daily. Apply to itchy area 2 times a day 05/05/22   Early, Sung Amabile, NP      Allergies    Ciprofloxacin, Guaifenesin er, and Penicillins    Review of Systems   Review of Systems  Gastrointestinal:  Positive for abdominal pain.  All other systems reviewed and are negative.   Physical Exam Updated Vital Signs BP (!) 132/91 (BP Location: Right Arm)   Pulse 100   Temp 98.3 F (36.8 C) (Oral)   Resp 17   Ht 5\' 6"  (1.676 m)   Wt 86.2 kg   SpO2 96%   BMI 30.67 kg/m  Physical Exam Vitals and nursing note reviewed.  Constitutional:      General: She is not in acute distress.    Appearance: She is not diaphoretic.  HENT:     Head: Normocephalic and atraumatic.     Mouth/Throat:     Pharynx: No oropharyngeal exudate.  Eyes:     General: No scleral icterus.    Conjunctiva/sclera: Conjunctivae normal.  Cardiovascular:     Rate and Rhythm: Normal rate and regular rhythm.     Pulses: Normal pulses.     Heart sounds: Normal heart sounds.  Pulmonary:     Effort: Pulmonary effort is normal. No respiratory distress.     Breath sounds: Normal breath sounds. No wheezing.  Abdominal:     General: Bowel sounds are normal.     Palpations: Abdomen is soft. There is no mass.     Tenderness: There is abdominal tenderness in the right lower quadrant, suprapubic area and left lower quadrant. There is no guarding or rebound.     Comments: TTP noted to lower abdominal region  Musculoskeletal:        General: Normal range of motion.     Cervical back: Normal range of motion and neck supple.  Skin:    General: Skin is warm and dry.  Neurological:     Mental Status: She is alert.  Psychiatric:        Behavior: Behavior normal.     ED Results / Procedures / Treatments   Labs (all labs ordered are listed, but only abnormal results are displayed) Labs Reviewed  URINALYSIS, ROUTINE W REFLEX MICROSCOPIC - Abnormal;  Notable for the following components:      Result Value   Color, Urine RED (*)    APPearance HAZY (*)    Glucose, UA   (*)    Value: TEST NOT REPORTED DUE TO COLOR INTERFERENCE OF URINE PIGMENT   Hgb urine dipstick   (*)    Value: TEST NOT REPORTED DUE TO COLOR INTERFERENCE OF URINE PIGMENT  Bilirubin Urine   (*)    Value: TEST NOT REPORTED DUE TO COLOR INTERFERENCE OF URINE PIGMENT   Ketones, ur   (*)    Value: TEST NOT REPORTED DUE TO COLOR INTERFERENCE OF URINE PIGMENT   Protein, ur   (*)    Value: TEST NOT REPORTED DUE TO COLOR INTERFERENCE OF URINE PIGMENT   Nitrite   (*)    Value: TEST NOT REPORTED DUE TO COLOR INTERFERENCE OF URINE PIGMENT   Leukocytes,Ua   (*)    Value: TEST NOT REPORTED DUE TO COLOR INTERFERENCE OF URINE PIGMENT   All other components within normal limits  URINALYSIS, MICROSCOPIC (REFLEX) - Abnormal; Notable for the following components:   Bacteria, UA FEW (*)    All other components within normal limits  CBC WITH DIFFERENTIAL/PLATELET - Abnormal; Notable for the following components:   WBC 13.9 (*)    Neutro Abs 10.5 (*)    All other components within normal limits  COMPREHENSIVE METABOLIC PANEL - Abnormal; Notable for the following components:   Glucose, Bld 190 (*)    AST 12 (*)    Alkaline Phosphatase 128 (*)    All other components within normal limits  LIPASE, BLOOD  CBC WITH DIFFERENTIAL/PLATELET    EKG None  Radiology CT ABDOMEN PELVIS W CONTRAST  Result Date: 03/22/2023 CLINICAL DATA:  Lower abdominal pain EXAM: CT ABDOMEN AND PELVIS WITH CONTRAST TECHNIQUE: Multidetector CT imaging of the abdomen and pelvis was performed using the standard protocol following bolus administration of intravenous contrast. RADIATION DOSE REDUCTION: This exam was performed according to the departmental dose-optimization program which includes automated exposure control, adjustment of the mA and/or kV according to patient size and/or use of iterative  reconstruction technique. CONTRAST:  85mL OMNIPAQUE IOHEXOL 300 MG/ML  SOLN COMPARISON:  CT 06/10/2021 FINDINGS: Lower chest: There is some linear opacity lung bases likely scar or atelectasis. No pleural effusion. Large hiatal hernia. Hepatobiliary: Breathing motion seen throughout the examination. No space-occupying liver lesion. Patent portal vein. Gallbladder is surgically absent. Pancreas: Mild global atrophy of the pancreas. No obvious pancreatic mass. Spleen: Spleen is nonenlarged. There is small rim calcified cystic structure centrally on series 2, image 18 measuring 14 mm. Unchanged from previous. Small splenules. Adrenals/Urinary Tract: Slight nodular thickening of the left adrenal gland. Nonspecific. No specific imaging follow-up. The right adrenal gland is preserved. There is a Bosniak 2 posterior right-sided renal cystic lesion. No specific imaging follow-up. No enhancing renal mass or collecting system dilatation. The ureters have normal course and caliber. Preserved contours of the urinary bladder with some mild wall thickening and stranding. Please correlate for any evidence of cystitis. Stomach/Bowel: No oral contrast. Scattered colonic stool. Sigmoid colon diverticula. Stomach and small bowel are nondilated. Once again there is a large hiatal hernia. Appendix is not seen in the right lower quadrant. No pericecal stranding or fluid. Vascular/Lymphatic: Aortic atherosclerosis. No enlarged abdominal or pelvic lymph nodes. Reproductive: Previous hysterectomy. Once again there is a complex cystic structure in the right adnexa with some wall thickening. When including the surrounding soft tissue thickening this measures 4.0 by 2.7 cm. Going back to the study of May 2021 this would have measured 4.2 by 2.3 cm when measured in same fashion demonstrating 3 years of stability. Possibly benign ovarian cystic lesion. Other: No free air or free fluid. Musculoskeletal: Scattered degenerative changes are seen  along the spine. IMPRESSION: Large hiatal hernia. No bowel obstruction, free air or free fluid. Few colonic diverticula. Slight wall  thickening of the urinary bladder with some stranding. Please correlate for any clinical evidence of cystitis. Stable complex cystic lesion in the right adnexa, likely ovarian. Stable for 3 years. Motion Electronically Signed   By: Karen Kays M.D.   On: 03/22/2023 19:18    Procedures Procedures    Medications Ordered in ED Medications  iohexol (OMNIPAQUE) 300 MG/ML solution 100 mL (85 mLs Intravenous Contrast Given 03/22/23 1903)  cephALEXin (KEFLEX) capsule 500 mg (500 mg Oral Given 03/22/23 2112)    ED Course/ Medical Decision Making/ A&P Clinical Course as of 03/22/23 2339  Thu Mar 22, 2023  2045 Re-evaluated and discussed with patient lab and imaging findings. Discussed discharge treatment plan. Answered all available questions. Pt appears safe for discharge. [SB]  2045 Due to patient concerns for hematuria and unsure if blood coming from urine or vaginal region.  Offered pelvic exam at this time.  Patient declines and notes that she opts to follow-up with her regular doctor for pelvic exam and additional testing as needed. [SB]    Clinical Course User Index [SB] ,  A, PA-C                                  Medical Decision Making Amount and/or Complexity of Data Reviewed Labs: ordered. Radiology: ordered.  Risk Prescription drug management.   Patient presents to the emergency department with lower abdominal cramping x 2 days. Pt afebrile. On exam patient with lower abdominal region. Remainder of exam without acute findings.  Pt afebrile. Differential diagnosis includes appendicitis, diverticulitis, acute cystitis, pyelonephritis, nephrolithiasis.  Labs:  I ordered, and personally interpreted labs.  The pertinent results include:  CBC with leukocytosis at 13.9 otherwise unremarkable Lipase unremarkable CMP with elevated glucose at 190  otherwise unremarkable Urinalysis unable to test due to red coloring.  Imaging: I ordered imaging studies including CT abdomen pelvis with I independently visualized and interpreted imaging which showed  Large hiatal hernia. No bowel obstruction, free air or free fluid.  Few colonic diverticula.    Slight wall thickening of the urinary bladder with some stranding.  Please correlate for any clinical evidence of cystitis.    Stable complex cystic lesion in the right adnexa, likely ovarian.  Stable for 3 years.    Motion   I agree with the radiologist interpretation  Medications:  I ordered medication including Keflex for antibiotic treatment.  I have reviewed the patients home medicines and have made adjustments as needed   Disposition: Presentation suspicious for acute cystitis.  Doubt concerns this time for pyelonephritis, nephrolithiasis, appendicitis, diverticulitis. After consideration of the diagnostic results and the patients response to treatment, I feel that the patient would benefit from Discharge home.  Patient discharged home with prescription for Keflex.  Instructed to follow-up with primary care provider regarding today's ED visit. Supportive care measures and strict return precautions discussed with patient at bedside. Pt acknowledges and verbalizes understanding. Pt appears safe for discharge. Follow up as indicated in discharge paperwork.   This chart was dictated using voice recognition software, Dragon. Despite the best efforts of this provider to proofread and correct errors, errors may still occur which can change documentation meaning.  Final Clinical Impression(s) / ED Diagnoses Final diagnoses:  Acute cystitis with hematuria    Rx / DC Orders ED Discharge Orders          Ordered    cephALEXin (KEFLEX) 500 MG capsule  2 times daily        03/22/23 2142              ,  A, PA-C 03/22/23 2339    Tegeler, Canary Brim, MD 03/23/23  [phone]

## 2023-03-23 ENCOUNTER — Other Ambulatory Visit (HOSPITAL_BASED_OUTPATIENT_CLINIC_OR_DEPARTMENT_OTHER): Payer: Self-pay | Admitting: Nurse Practitioner

## 2023-03-23 DIAGNOSIS — E1159 Type 2 diabetes mellitus with other circulatory complications: Secondary | ICD-10-CM

## 2023-03-29 ENCOUNTER — Telehealth: Payer: Self-pay | Admitting: *Deleted

## 2023-03-29 NOTE — Telephone Encounter (Signed)
Transition Care Management Unsuccessful Follow-up Telephone Call  Date of discharge and from where:  [facility]  03/22/2023  Attempts:  1st Attempt  Reason for unsuccessful TCM follow-up call:  Left voice message

## 2023-04-10 ENCOUNTER — Encounter (HOSPITAL_COMMUNITY): Payer: Self-pay

## 2023-04-10 ENCOUNTER — Emergency Department (HOSPITAL_COMMUNITY): Payer: Medicare Other

## 2023-04-10 ENCOUNTER — Inpatient Hospital Stay (HOSPITAL_COMMUNITY)
Admission: EM | Admit: 2023-04-10 | Discharge: 2023-04-12 | DRG: 690 | Disposition: A | Discharge status: Home / Self Care | Payer: Medicare Other | Attending: Internal Medicine | Admitting: Internal Medicine

## 2023-04-10 ENCOUNTER — Other Ambulatory Visit: Payer: Self-pay

## 2023-04-10 DIAGNOSIS — E785 Hyperlipidemia, unspecified: Secondary | ICD-10-CM | POA: Insufficient documentation

## 2023-04-10 DIAGNOSIS — N12 Tubulo-interstitial nephritis, not specified as acute or chronic: Secondary | ICD-10-CM

## 2023-04-10 DIAGNOSIS — K5730 Diverticulosis of large intestine without perforation or abscess without bleeding: Secondary | ICD-10-CM | POA: Diagnosis present

## 2023-04-10 DIAGNOSIS — Z888 Allergy status to other drugs, medicaments and biological substances status: Secondary | ICD-10-CM

## 2023-04-10 DIAGNOSIS — Z881 Allergy status to other antibiotic agents status: Secondary | ICD-10-CM

## 2023-04-10 DIAGNOSIS — B961 Klebsiella pneumoniae [K. pneumoniae] as the cause of diseases classified elsewhere: Secondary | ICD-10-CM | POA: Diagnosis present

## 2023-04-10 DIAGNOSIS — Z1152 Encounter for screening for COVID-19: Secondary | ICD-10-CM

## 2023-04-10 DIAGNOSIS — Z880 Allergy status to penicillin: Secondary | ICD-10-CM

## 2023-04-10 DIAGNOSIS — E876 Hypokalemia: Secondary | ICD-10-CM | POA: Diagnosis present

## 2023-04-10 DIAGNOSIS — I152 Hypertension secondary to endocrine disorders: Secondary | ICD-10-CM | POA: Diagnosis present

## 2023-04-10 DIAGNOSIS — K219 Gastro-esophageal reflux disease without esophagitis: Secondary | ICD-10-CM | POA: Diagnosis present

## 2023-04-10 DIAGNOSIS — Z825 Family history of asthma and other chronic lower respiratory diseases: Secondary | ICD-10-CM

## 2023-04-10 DIAGNOSIS — L739 Follicular disorder, unspecified: Secondary | ICD-10-CM

## 2023-04-10 DIAGNOSIS — E58 Dietary calcium deficiency: Secondary | ICD-10-CM | POA: Diagnosis present

## 2023-04-10 DIAGNOSIS — E119 Type 2 diabetes mellitus without complications: Secondary | ICD-10-CM

## 2023-04-10 DIAGNOSIS — E1159 Type 2 diabetes mellitus with other circulatory complications: Secondary | ICD-10-CM | POA: Diagnosis present

## 2023-04-10 DIAGNOSIS — Z7984 Long term (current) use of oral hypoglycemic drugs: Secondary | ICD-10-CM

## 2023-04-10 DIAGNOSIS — Z79899 Other long term (current) drug therapy: Secondary | ICD-10-CM

## 2023-04-10 DIAGNOSIS — Z791 Long term (current) use of non-steroidal anti-inflammatories (NSAID): Secondary | ICD-10-CM

## 2023-04-10 DIAGNOSIS — Z8719 Personal history of other diseases of the digestive system: Secondary | ICD-10-CM

## 2023-04-10 DIAGNOSIS — K449 Diaphragmatic hernia without obstruction or gangrene: Secondary | ICD-10-CM | POA: Diagnosis present

## 2023-04-10 DIAGNOSIS — Z9049 Acquired absence of other specified parts of digestive tract: Secondary | ICD-10-CM

## 2023-04-10 DIAGNOSIS — Z90711 Acquired absence of uterus with remaining cervical stump: Secondary | ICD-10-CM

## 2023-04-10 DIAGNOSIS — R112 Nausea with vomiting, unspecified: Secondary | ICD-10-CM | POA: Diagnosis not present

## 2023-04-10 DIAGNOSIS — E1169 Type 2 diabetes mellitus with other specified complication: Secondary | ICD-10-CM | POA: Diagnosis present

## 2023-04-10 DIAGNOSIS — Z7982 Long term (current) use of aspirin: Secondary | ICD-10-CM

## 2023-04-10 HISTORY — DX: Tubulo-interstitial nephritis, not specified as acute or chronic: N12

## 2023-04-10 LAB — URINALYSIS, ROUTINE W REFLEX MICROSCOPIC
Bilirubin Urine: NEGATIVE
Glucose, UA: >=500 mg/dL — AB
Ketones, ur: 20 mg/dL — AB
Nitrite: POSITIVE — AB
Protein, ur: 30 mg/dL — AB
Specific Gravity, Urine: 1.027 (ref 1.005–1.030)
WBC, UA: >50 WBC/hpf (ref 0–5)
pH: 5 (ref 5.0–8.0)

## 2023-04-10 LAB — COMPREHENSIVE METABOLIC PANEL
ALT: 15 U/L (ref 0–44)
AST: 13 U/L — ABNORMAL LOW (ref 15–41)
Albumin: 3.8 g/dL (ref 3.5–5.0)
Alkaline Phosphatase: 131 U/L — ABNORMAL HIGH (ref 38–126)
Anion gap: 15 (ref 5–15)
BUN: 15 mg/dL (ref 8–23)
CO2: 22 mmol/L (ref 22–32)
Calcium: 8.8 mg/dL — ABNORMAL LOW (ref 8.9–10.3)
Chloride: 99 mmol/L (ref 98–111)
Creatinine, Ser: 0.8 mg/dL (ref 0.44–1.00)
GFR, Estimated: >60 mL/min (ref >60)
Glucose, Bld: 257 mg/dL — ABNORMAL HIGH (ref 70–99)
Potassium: 4 mmol/L (ref 3.5–5.1)
Sodium: 136 mmol/L (ref 135–145)
Total Bilirubin: 1.3 mg/dL — ABNORMAL HIGH (ref 0.3–1.2)
Total Protein: 7.2 g/dL (ref 6.5–8.1)

## 2023-04-10 LAB — GLUCOSE, CAPILLARY: Glucose-Capillary: 214 mg/dL — ABNORMAL HIGH (ref 70–99)

## 2023-04-10 LAB — CBC
HCT: 45.9 % (ref 36.0–46.0)
Hemoglobin: 15.5 g/dL — ABNORMAL HIGH (ref 12.0–15.0)
MCH: 29.8 pg (ref 26.0–34.0)
MCHC: 33.8 g/dL (ref 30.0–36.0)
MCV: 88.1 fL (ref 80.0–100.0)
Platelets: 344 10*3/uL (ref 150–400)
RBC: 5.21 MIL/uL — ABNORMAL HIGH (ref 3.87–5.11)
RDW: 13.2 % (ref 11.5–15.5)
WBC: 17 10*3/uL — ABNORMAL HIGH (ref 4.0–10.5)
nRBC: 0 % (ref 0.0–0.2)

## 2023-04-10 LAB — RESP PANEL BY RT-PCR (RSV, FLU A&B, COVID)  RVPGX2
Influenza A by PCR: NEGATIVE
Influenza B by PCR: NEGATIVE
Resp Syncytial Virus by PCR: NEGATIVE
SARS Coronavirus 2 by RT PCR: NEGATIVE

## 2023-04-10 LAB — TROPONIN I (HIGH SENSITIVITY)
Troponin I (High Sensitivity): 23 ng/L — ABNORMAL HIGH (ref <18)
Troponin I (High Sensitivity): 22 ng/L — ABNORMAL HIGH (ref <18)

## 2023-04-10 LAB — MAGNESIUM: Magnesium: 2.2 mg/dL (ref 1.7–2.4)

## 2023-04-10 LAB — LIPASE, BLOOD: Lipase: 18 U/L (ref 11–51)

## 2023-04-10 MED ORDER — PAROXETINE HCL 20 MG PO TABS
30.0000 mg | ORAL_TABLET | Freq: Every day | ORAL | Status: DC
  Administered 2023-04-11 – 2023-04-12 (×2): 30 mg via ORAL
  Filled 2023-04-10 (×2): qty 1

## 2023-04-10 MED ORDER — ONDANSETRON HCL 4 MG/2ML IJ SOLN
4.0000 mg | Freq: Four times a day (QID) | INTRAMUSCULAR | Status: DC | PRN
  Administered 2023-04-11: 4 mg via INTRAVENOUS
  Filled 2023-04-10: qty 2

## 2023-04-10 MED ORDER — ONDANSETRON HCL 4 MG PO TABS: 4.0000 mg | ORAL_TABLET | Freq: Four times a day (QID) | ORAL | Status: DC | PRN

## 2023-04-10 MED ORDER — INSULIN ASPART 100 UNIT/ML IJ SOLN
0.0000 [IU] | Freq: Three times a day (TID) | INTRAMUSCULAR | Status: DC
  Administered 2023-04-11: 3 [IU] via SUBCUTANEOUS
  Administered 2023-04-11: 5 [IU] via SUBCUTANEOUS
  Administered 2023-04-11 – 2023-04-12 (×2): 3 [IU] via SUBCUTANEOUS
  Filled 2023-04-10: qty 0.15

## 2023-04-10 MED ORDER — SODIUM CHLORIDE 0.9 % IV SOLN
1.0000 g | INTRAVENOUS | Status: DC
  Administered 2023-04-11: 1 g via INTRAVENOUS
  Filled 2023-04-10: qty 10

## 2023-04-10 MED ORDER — IOHEXOL 300 MG/ML  SOLN
100.0000 mL | Freq: Once | INTRAMUSCULAR | Status: AC | PRN
  Administered 2023-04-10: 100 mL via INTRAVENOUS

## 2023-04-10 MED ORDER — LACTATED RINGERS IV BOLUS
1000.0000 mL | Freq: Once | INTRAVENOUS | Status: AC
  Administered 2023-04-10: 1000 mL via INTRAVENOUS

## 2023-04-10 MED ORDER — SODIUM CHLORIDE 0.9 % IV SOLN
1.0000 g | Freq: Once | INTRAVENOUS | Status: AC
  Administered 2023-04-10: 1 g via INTRAVENOUS
  Filled 2023-04-10: qty 10

## 2023-04-10 MED ORDER — ACETAMINOPHEN 650 MG RE SUPP: 650.0000 mg | Freq: Four times a day (QID) | RECTAL | Status: DC | PRN

## 2023-04-10 MED ORDER — PANTOPRAZOLE SODIUM 40 MG PO TBEC
40.0000 mg | DELAYED_RELEASE_TABLET | Freq: Two times a day (BID) | ORAL | Status: DC
  Administered 2023-04-10 – 2023-04-12 (×4): 40 mg via ORAL
  Filled 2023-04-10 (×4): qty 1

## 2023-04-10 MED ORDER — LISINOPRIL 10 MG PO TABS: 20.0000 mg | ORAL_TABLET | Freq: Every day | ORAL | Status: DC

## 2023-04-10 MED ORDER — ENOXAPARIN SODIUM 40 MG/0.4ML IJ SOSY
40.0000 mg | PREFILLED_SYRINGE | INTRAMUSCULAR | Status: DC
  Administered 2023-04-10 – 2023-04-11 (×2): 40 mg via SUBCUTANEOUS
  Filled 2023-04-10 (×2): qty 0.4

## 2023-04-10 MED ORDER — INSULIN ASPART 100 UNIT/ML IJ SOLN
0.0000 [IU] | Freq: Every day | INTRAMUSCULAR | Status: DC
  Administered 2023-04-10: 2 [IU] via SUBCUTANEOUS
  Filled 2023-04-10: qty 0.05

## 2023-04-10 MED ORDER — OXYCODONE HCL 5 MG PO TABS: 5.0000 mg | ORAL_TABLET | ORAL | Status: DC | PRN

## 2023-04-10 MED ORDER — SENNOSIDES-DOCUSATE SODIUM 8.6-50 MG PO TABS: 1.0000 | ORAL_TABLET | Freq: Every evening | ORAL | Status: DC | PRN

## 2023-04-10 MED ORDER — ROSUVASTATIN CALCIUM 20 MG PO TABS
20.0000 mg | ORAL_TABLET | Freq: Every day | ORAL | Status: DC
  Administered 2023-04-11 – 2023-04-12 (×2): 20 mg via ORAL
  Filled 2023-04-10 (×2): qty 1

## 2023-04-10 MED ORDER — ACETAMINOPHEN 325 MG PO TABS
650.0000 mg | ORAL_TABLET | Freq: Four times a day (QID) | ORAL | Status: DC | PRN
  Administered 2023-04-11 – 2023-04-12 (×3): 650 mg via ORAL
  Filled 2023-04-10 (×3): qty 2

## 2023-04-10 MED ORDER — MORPHINE SULFATE (PF) 2 MG/ML IV SOLN: 1.0000 mg | INTRAVENOUS | Status: DC | PRN

## 2023-04-10 MED ORDER — ONDANSETRON HCL 4 MG/2ML IJ SOLN: 4.0000 mg | Freq: Four times a day (QID) | INTRAMUSCULAR | Status: DC | PRN

## 2023-04-10 NOTE — ED Notes (Signed)
ED TO INPATIENT HANDOFF REPORT  S Name/Age/Gender Kendra Miles 72 y.o. female Room/Bed: WA22/WA22  Code Status   Code Status: Prior  Home/SNF/Other Home Patient oriented to: self, place, time, and situation Is this baseline? Yes   Triage Complete: Triage complete  Chief Complaint Pyelonephritis of right kidney [N12]  Triage Note Pt states she has been vomiting since yesterday.  Pt endorses weakness, fatigue, diaphoresis.   Non tender abdomen on palpation Last meal was 8/19 in the morning, unable to keep anything down since noon. Pt endorses frequent urination x2 weeks   Allergies Allergies  Allergen Reactions   Ciprofloxacin Other (See Comments)    Stomach cramps    Guaifenesin Er Other (See Comments)    Mucinex - keeps pt awake all night    Penicillins Palpitations    Did it involve swelling of the face/tongue/throat, SOB, or low BP? No Did it involve sudden or severe rash/hives, skin peeling, or any reaction on the inside of your mouth or nose? No Did you need to seek medical attention at a hospital or doctor's office? No When did it last happen? [phone]     If all above answers are "NO", may proceed with cephalosporin use.     Level of Care/Admitting Diagnosis ED Disposition     ED Disposition  Admit   Condition  --   Comment  Hospital Area: [facility] [[id number]]  Level of Care: Med-Surg [16]  May place patient in observation at [facility] or [facility] if equivalent level of care is available:: No  Covid Evaluation: Confirmed COVID Negative  Diagnosis: Pyelonephritis of right kidney [[id number]]  Admitting Physician: Charlsie Quest [[id number]]  Attending Physician: Charlsie Quest [[id number]]          B Medical/Surgery History Past Medical History:  Diagnosis Date   Acid reflux    Arthritis    Back pain 11/02/2020   Calcium deficiency 11/02/2020   Cholelithiasis with choledocholithiasis 02/2019   Diabetes (HCC)    Emesis,  persistent 06/10/2021   Encounter to establish care with new doctor 11/02/2020   Fall 05/06/2021   Flatulence, eructation and gas pain 11/03/2020   Headache    Hiatal hernia    Hyperlipidemia    Lower extremity edema 11/02/2020   Menopausal symptoms 11/02/2020   Otitis media 08/30/2021   Pneumonia    Polyp of stomach and duodenum    Upper GI bleed 02/03/2021   Viral URI 08/23/2021   Past Surgical History:  Procedure Laterality Date   ABDOMINAL HYSTERECTOMY     partial   APPENDECTOMY     BIOPSY  03/08/2019   Procedure: BIOPSY;  Surgeon: Iva Boop, MD;  Location: [facility];  Service: Endoscopy;;   CHOLECYSTECTOMY N/A 03/09/2019   Procedure: laparoscopic cholecystectomy;  Surgeon: Griselda Miner, MD;  Location: [facility];  Service: General;  Laterality: N/A;   CYST REMOVAL TRUNK     ERCP N/A 03/08/2019   Procedure: ENDOSCOPIC RETROGRADE CHOLANGIOPANCREATOGRAPHY (ERCP);  Surgeon: Iva Boop, MD;  Location: [facility];  Service: Endoscopy;  Laterality: N/A;   ESOPHAGOGASTRODUODENOSCOPY (EGD) WITH PROPOFOL N/A 04/24/2019   Procedure: ESOPHAGOGASTRODUODENOSCOPY (EGD) WITH PROPOFOL;  Surgeon: Kerin Salen, MD;  Location: [facility];  Service: Gastroenterology;  Laterality: N/A;   HEMOSTASIS CLIP PLACEMENT  04/24/2019   Procedure: HEMOSTASIS CLIP PLACEMENT;  Surgeon: Kerin Salen, MD;  Location: [facility];  Service: Gastroenterology;;   LAPAROSCOPIC NISSEN FUNDOPLICATION N/A 06/13/2021   Procedure: LAPAROSCOPIC HIATAL HERNIA REPAIR;  Surgeon: Quentin Ore, MD;  Location: [facility];  Service: General;  Laterality: N/A;   PANCREATIC STENT PLACEMENT  03/08/2019   Procedure: PANCREATIC STENT PLACEMENT;  Surgeon: Iva Boop, MD;  Location: [facility];  Service: Endoscopy;;   POLYPECTOMY  04/24/2019   Procedure: POLYPECTOMY;  Surgeon: Kerin Salen, MD;  Location: [facility];  Service: Gastroenterology;;   REMOVAL OF STONES  03/08/2019   Procedure: REMOVAL OF STONES;  Surgeon: Iva Boop, MD;  Location: [facility];  Service: Endoscopy;;   SPHINCTEROTOMY  03/08/2019   Procedure: Dennison Mascot;  Surgeon: Iva Boop, MD;  Location: [facility];  Service: Endoscopy;;   UPPER GI ENDOSCOPY  06/13/2021   Procedure: UPPER GI ENDOSCOPY;  Surgeon: Quentin Ore, MD;  Location: [facility];  Service: General;;     A IV Location/Drains/Wounds Patient Lines/Drains/Airways Status     Active Line/Drains/Airways     Name Placement date Placement time Site Days   Peripheral IV 04/10/23 22 G Posterior;Right Hand 04/10/23  1556  Hand  less than 1            Intake/Output Last 24 hours  Intake/Output Summary (Last 24 hours) at 04/10/2023 2008 Last data filed at 04/10/2023 2006 Gross per 24 hour  Intake 1100 ml  Output --  Net 1100 ml    Labs/Imaging Results for orders placed or performed during the hospital encounter of 04/10/23 (from the past 48 hour(s))  Lipase, blood     Status: None   Collection Time: 04/10/23  2:08 PM  Result Value Ref Range   Lipase 18 11 - 51 U/L    Comment: Performed at [facility], [address], [place], [place] [address]  Comprehensive metabolic panel     Status: Abnormal   Collection Time: 04/10/23  2:08 PM  Result Value Ref Range   Sodium 136 135 - 145 mmol/L   Potassium 4.0 3.5 - 5.1 mmol/L   Chloride 99 98 - 111 mmol/L   CO2 22 22 - 32 mmol/L   Glucose, Bld 257 (H) 70 - 99 mg/dL    Comment: Glucose reference range applies only to samples taken after fasting for at least 8 hours.   BUN 15 8 - 23 mg/dL   Creatinine, Ser 6.04 0.44 - 1.00 mg/dL   Calcium 8.8 (L) 8.9 - 10.3 mg/dL   Total Protein 7.2 6.5 - 8.1 g/dL   Albumin 3.8 3.5 - 5.0 g/dL   AST 13 (L) 15 - 41 U/L   ALT 15 0 - 44 U/L   Alkaline Phosphatase 131 (H) 38 - 126 U/L   Total Bilirubin 1.3 (H) 0.3 - 1.2 mg/dL   GFR, Estimated >54 >09 mL/min    Comment: (NOTE) Calculated using the CKD-EPI Creatinine Equation (2021)    Anion gap 15 5 -  15    Comment: Performed at [facility], [address] [place], [place] [address]  CBC     Status: Abnormal   Collection Time: 04/10/23  2:08 PM  Result Value Ref Range   WBC 17.0 (H) 4.0 - 10.5 K/uL   RBC 5.21 (H) 3.87 - 5.11 MIL/uL   Hemoglobin 15.5 (H) 12.0 - 15.0 g/dL   HCT 47.8 29.5 - 62.1 %   MCV 88.1 80.0 - 100.0 fL   MCH 29.8 26.0 - 34.0 pg   MCHC 33.8 30.0 - 36.0 g/dL   RDW 30.8 65.7 - 84.6 %   Platelets 344 150 - 400  K/uL   nRBC 0.0 0.0 - 0.2 %    Comment: Performed at [facility], [address] [place], [place] [address]  Magnesium     Status: None   Collection Time: 04/10/23  3:59 PM  Result Value Ref Range   Magnesium 2.2 1.7 - 2.4 mg/dL    Comment: Performed at [facility], [address], [place], [place] [address]  Troponin I (High Sensitivity)     Status: Abnormal   Collection Time: 04/10/23  3:59 PM  Result Value Ref Range   Troponin I (High Sensitivity) 23 (H) <18 ng/L    Comment: (NOTE) Elevated high sensitivity troponin I (hsTnI) values and significant  changes across serial measurements may suggest ACS but many other  chronic and acute conditions are known to elevate hsTnI results.  Refer to the "Links" section for chest pain algorithms and additional  guidance. Performed at [facility], [address], [place], [place] [address]   Resp panel by RT-PCR (RSV, Flu A&B, Covid) Anterior Nasal Swab     Status: None   Collection Time: 04/10/23  3:59 PM   Specimen: Anterior Nasal Swab  Result Value Ref Range   SARS Coronavirus 2 by RT PCR NEGATIVE NEGATIVE    Comment: (NOTE) SARS-CoV-2 target nucleic acids are NOT DETECTED.  The SARS-CoV-2 RNA is generally detectable in upper respiratory specimens during the acute phase of infection. The lowest concentration of SARS-CoV-2 viral copies this assay can detect is 138 copies/mL. A negative result does not preclude  SARS-Cov-2 infection and should not be used as the sole basis for treatment or other patient management decisions. A negative result may occur with  improper specimen collection/handling, submission of specimen other than nasopharyngeal swab, presence of viral mutation(s) within the areas targeted by this assay, and inadequate number of viral copies(<138 copies/mL). A negative result must be combined with clinical observations, patient history, and epidemiological information. The expected result is Negative.  Fact Sheet for Patients:  BloggerCourse.com  Fact Sheet for Healthcare Providers:  SeriousBroker.it  This test is no t yet approved or cleared by the [place] FDA and  has been authorized for detection and/or diagnosis of SARS-CoV-2 by FDA under an Emergency Use Authorization (EUA). This EUA will remain  in effect (meaning this test can be used) for the duration of the COVID-19 declaration under Section 564(b)(1) of the Act, 21 U.S.C.section 360bbb-3(b)(1), unless the authorization is terminated  or revoked sooner.       Influenza A by PCR NEGATIVE NEGATIVE   Influenza B by PCR NEGATIVE NEGATIVE    Comment: (NOTE) The Xpert Xpress SARS-CoV-2/FLU/RSV plus assay is intended as an aid in the diagnosis of influenza from Nasopharyngeal swab specimens and should not be used as a sole basis for treatment. Nasal washings and aspirates are unacceptable for Xpert Xpress SARS-CoV-2/FLU/RSV testing.  Fact Sheet for Patients: BloggerCourse.com  Fact Sheet for Healthcare Providers: SeriousBroker.it  This test is not yet approved or cleared by the [place] FDA and has been authorized for detection and/or diagnosis of SARS-CoV-2 by FDA under an Emergency Use Authorization (EUA). This EUA will remain in effect (meaning this test can be used) for the duration of the COVID-19  declaration under Section 564(b)(1) of the Act, 21 U.S.C. section 360bbb-3(b)(1), unless the authorization is terminated or revoked.     Resp Syncytial Virus by PCR NEGATIVE NEGATIVE    Comment: (NOTE) Fact Sheet for Patients: BloggerCourse.com  Fact Sheet for  Healthcare Providers: SeriousBroker.it  This test is not yet approved or cleared by the [place] and has been authorized for detection and/or diagnosis of SARS-CoV-2 by FDA under an Emergency Use Authorization (EUA). This EUA will remain in effect (meaning this test can be used) for the duration of the COVID-19 declaration under Section 564(b)(1) of the Act, 21 U.S.C. section 360bbb-3(b)(1), unless the authorization is terminated or revoked.  Performed at [facility], [address] [place], [place] [address]   Urinalysis, Routine w reflex microscopic -Urine, Clean Catch     Status: Abnormal   Collection Time: 04/10/23  4:27 PM  Result Value Ref Range   Color, Urine YELLOW YELLOW   APPearance CLOUDY (A) CLEAR   Specific Gravity, Urine 1.027 1.005 - 1.030   pH 5.0 5.0 - 8.0   Glucose, UA >=500 (A) NEGATIVE mg/dL   Hgb urine dipstick SMALL (A) NEGATIVE   Bilirubin Urine NEGATIVE NEGATIVE   Ketones, ur 20 (A) NEGATIVE mg/dL   Protein, ur 30 (A) NEGATIVE mg/dL   Nitrite POSITIVE (A) NEGATIVE   Leukocytes,Ua LARGE (A) NEGATIVE   RBC / HPF 6-10 0 - 5 RBC/hpf   WBC, UA >50 0 - 5 WBC/hpf   Bacteria, UA MANY (A) NONE SEEN   Squamous Epithelial / HPF 0-5 0 - 5 /HPF    Comment: Performed at [facility], [address] [place], [place] [address]  Troponin I (High Sensitivity)     Status: Abnormal   Collection Time: 04/10/23  5:33 PM  Result Value Ref Range   Troponin I (High Sensitivity) 22 (H) <18 ng/L    Comment: (NOTE) Elevated high sensitivity troponin I (hsTnI) values and significant  changes across serial measurements  may suggest ACS but many other  chronic and acute conditions are known to elevate hsTnI results.  Refer to the "Links" section for chest pain algorithms and additional  guidance. Performed at [facility], [address] [place], [place] [address]    CT ABDOMEN PELVIS W CONTRAST  Result Date: 04/10/2023 CLINICAL DATA:  Vomiting and abdominal pain beginning yesterday. Suspected bowel obstruction. EXAM: CT ABDOMEN AND PELVIS WITH CONTRAST TECHNIQUE: Multidetector CT imaging of the abdomen and pelvis was performed using the standard protocol following bolus administration of intravenous contrast. RADIATION DOSE REDUCTION: This exam was performed according to the departmental dose-optimization program which includes automated exposure control, adjustment of the mA and/or kV according to patient size and/or use of iterative reconstruction technique. CONTRAST:  OMNIPAQUE IOHEXOL 300 MG/ML  SOLN COMPARISON:  03/22/2023 and 01/13/2020 FINDINGS: Lower Chest: No acute findings. Hepatobiliary: No suspicious hepatic masses identified. Prior cholecystectomy. No evidence of biliary obstruction. Pancreas:  No mass or inflammatory changes. Spleen: Within normal limits in size. No suspicious splenic lesions identified. Adrenals/Urinary Tract: Ill-defined area of decreased contrast enhancement is seen in the posterior lower pole of the right kidney with the adjacent perinephric soft tissue stranding. This is consistent pyelonephritis. No evidence of renal mass or abscess. No evidence of ureteral calculi or hydronephrosis. Unremarkable unopacified urinary bladder. Stomach/Bowel: Large hiatal hernia shows no significant change. No evidence of obstruction, inflammatory process or abnormal fluid collections. Diverticulosis is seen mainly involving the sigmoid colon, however there is no evidence of diverticulitis. Vascular/Lymphatic: No pathologically enlarged lymph nodes. No acute vascular findings.  Reproductive: Prior hysterectomy noted. Right adnexal mass measures 4.0 x 2.4 cm, and remains stable compared to prior studies dating back to 2021. This is consistent  with benign etiology. No other pelvic masses or inflammatory process identified. No evidence of free fluid. Other:  None. Musculoskeletal:  No suspicious bone lesions identified. IMPRESSION: Mild right pyelonephritis. No evidence of renal abscess or hydronephrosis. Stable large hiatal hernia. Colonic diverticulosis, without radiographic evidence of diverticulitis. Right adnexal mass remains stable, consistent with benign etiology. Electronically Signed   By: Danae Orleans M.D.   On: 04/10/2023 17:46    Pending Labs Unresulted Labs (From admission, onward)     Start     Ordered   04/10/23 1931  Urine Culture  Add-on,   AD       Question:  Indication  Answer:  Urgency/frequency   04/10/23 1930            Vitals/Pain Today's Vitals   04/10/23 1630 04/10/23 1651 04/10/23 1810 04/10/23 2007  BP: 115/84  112/80 124/72  Pulse: 97  92 89  Resp: 17  (!) 24 17  Temp:  98 F (36.7 C)  98.5 F (36.9 C)  TempSrc:  Oral  Oral  SpO2: 90%  93% 95%  Weight:      Height:      PainSc:    0-No pain    Isolation Precautions No active isolations  Medications Medications  ondansetron (ZOFRAN) injection 4 mg (has no administration in time range)  lactated ringers bolus 1,000 mL (0 mLs Intravenous Stopped 04/10/23 2006)  iohexol (OMNIPAQUE) 300 MG/ML solution 100 mL (100 mLs Intravenous Contrast Given 04/10/23 1639)  cefTRIAXone (ROCEPHIN) 1 g in sodium chloride 0.9 % 100 mL IVPB (0 g Intravenous Stopped 04/10/23 2006)    Mobility walks     R Report given to:

## 2023-04-10 NOTE — ED Provider Notes (Signed)
[place] [facility] AT [facility] Provider Note   CSN: [id number] Arrival date & time: 04/10/23  1243     History  Chief Complaint  Patient presents with   Emesis    Kendra Miles is a 72 y.o. female.   Emesis Associated symptoms: abdominal pain   Patient presents for nausea and vomiting.  Medical history includes HTN, DM, GERD, hiatal hernia, prior GI bleed, arthritis.  She was in her normal state of health early yesterday.  Yesterday evening, she developed nausea, vomiting, p.o. intolerance.  She has only been able to tolerate small sips of water since that time.  She has had subsequent fatigue and generalized weakness.  She endorses some lower abdominal discomfort.  She has had some recent urinary frequency.  Last bowel movement was yesterday.  She has undergone hiatal hernia repair in the past.  She continues to take PPI.  She has not tried any nausea medicine at home.  Currently, she denies nausea.     Home Medications Prior to Admission medications   Medication Sig Start Date End Date Taking? Authorizing Provider  aspirin 81 MG chewable tablet Chew 1 tablet (81 mg total) by mouth daily. 11/02/20   Tollie Eth, NP  blood glucose meter kit and supplies Testing at least once per day (morning fasting), with option to test up to 4 times per day (morning fasting and as needed for symptoms) as needed for better control. Use only the strips that come in this kit with this meter- other strips will not work properly. 11/02/20   Tollie Eth, NP  calcium carbonate (OSCAL) 1500 (600 Ca) MG TABS tablet Take 0.4 tablets (600 mg total) by mouth daily. 11/02/20   Tollie Eth, NP  doxycycline (VIBRA-TABS) 100 MG tablet Take 1 tablet (100 mg total) by mouth 2 (two) times daily. 05/05/22   Tollie Eth, NP  emollient lotion Apply topically 2 (two) times daily as needed. Apply to legs as needed for itching and dryness. 11/02/20   Tollie Eth, NP  fluconazole (DIFLUCAN)  150 MG tablet Take one tablet by mouth at the first sign of symptoms of yeast. If no resolution, repeat dose in 72 hours. 05/05/22   Tollie Eth, NP  fluticasone (FLONASE) 50 MCG/ACT nasal spray SPRAY 2 SPRAYS INTO EACH NOSTRIL EVERY DAY 12/15/21   Early, Sung Amabile, NP  lisinopril (ZESTRIL) 20 MG tablet TAKE 1 TABLET BY MOUTH EVERY DAY 03/23/23   Early, Sung Amabile, NP  loratadine (CLARITIN) 10 MG tablet Take 1 tablet (10 mg total) by mouth daily. 02/04/21   Tollie Eth, NP  meloxicam (MOBIC) 7.5 MG tablet TAKE 1 TABLET BY MOUTH EVERY DAY 03/03/22   Early, Sung Amabile, NP  metFORMIN (GLUCOPHAGE) 500 MG tablet TAKE 1 TABLET BY MOUTH TWICE A DAY WITH FOOD 05/01/22   Early, Sung Amabile, NP  Multiple Vitamin (MULTIVITAMIN WITH MINERALS) TABS tablet Take 1 tablet by mouth daily. Women's 50+ Multivitamin    [provider]  Omega-3 Fatty Acids (FISH OIL PO) Take 2,000 mg by mouth daily.    [provider]  ondansetron (ZOFRAN) 8 MG tablet Take 0.5 tablets (4 mg total) by mouth every 8 (eight) hours as needed for nausea or vomiting. 02/03/22   Early, Sung Amabile, NP  pantoprazole (PROTONIX) 40 MG tablet Take 1 tablet (40 mg total) by mouth 2 (two) times daily. 05/05/22   Tollie Eth, NP  PARoxetine (PAXIL) 30 MG tablet  TAKE 1 TABLET BY MOUTH EVERY DAY 01/18/23   Early, Sung Amabile, NP  rizatriptan (MAXALT) 10 MG tablet Take 1 tablet (10 mg total) by mouth as needed for migraine. May repeat in 2 hours if needed. Do not take more than 2 doses in 24 hours. 07/20/21   Tollie Eth, NP  rosuvastatin (CRESTOR) 20 MG tablet Take 1 tablet (20 mg total) by mouth daily. Please call office to schedule an appt for further refills. Thank you 03/13/23   Christell Constant, MD  Semaglutide, 1 MG/DOSE, 4 MG/3ML SOPN Inject 1 mg as directed once a week. 05/05/22   Tollie Eth, NP  triamcinolone cream (KENALOG) 0.1 % Apply topically 2 (two) times daily. Apply to itchy area 2 times a day 05/05/22   Early, Sung Amabile, NP      Allergies     Ciprofloxacin, Guaifenesin er, and Penicillins    Review of Systems   Review of Systems  Constitutional:  Positive for fatigue.  Gastrointestinal:  Positive for abdominal pain, nausea and vomiting.  Genitourinary:  Positive for frequency.  Neurological:  Positive for weakness (Generalized).  All other systems reviewed and are negative.   Physical Exam Updated Vital Signs BP 124/72 (BP Location: Right Arm)   Pulse 89   Temp 98.5 F (36.9 C) (Oral)   Resp 17   Ht 5\' 6"  (1.676 m)   Wt 86.1 kg   SpO2 95%   BMI 30.64 kg/m  Physical Exam Vitals and nursing note reviewed.  Constitutional:      General: She is not in acute distress.    Appearance: Normal appearance. She is well-developed. She is not ill-appearing, toxic-appearing or diaphoretic.  HENT:     Head: Normocephalic and atraumatic.     Right Ear: External ear normal.     Left Ear: External ear normal.     Nose: Nose normal.     Mouth/Throat:     Mouth: Mucous membranes are moist.  Eyes:     Extraocular Movements: Extraocular movements intact.     Conjunctiva/sclera: Conjunctivae normal.  Cardiovascular:     Rate and Rhythm: Normal rate and regular rhythm.     Heart sounds: No murmur heard. Pulmonary:     Effort: Pulmonary effort is normal. No respiratory distress.     Breath sounds: Normal breath sounds. No wheezing or rales.  Chest:     Chest wall: No tenderness.  Abdominal:     General: There is no distension.     Palpations: Abdomen is soft.     Tenderness: There is no abdominal tenderness. There is no guarding or rebound.  Musculoskeletal:        General: No swelling. Normal range of motion.     Cervical back: Normal range of motion and neck supple.     Right lower leg: No edema.     Left lower leg: No edema.  Skin:    General: Skin is warm and dry.     Coloration: Skin is not jaundiced or pale.  Neurological:     General: No focal deficit present.     Mental Status: She is alert and oriented to  person, place, and time.  Psychiatric:        Mood and Affect: Mood normal.        Behavior: Behavior normal.     ED Results / Procedures / Treatments   Labs (all labs ordered are listed, but only abnormal results are displayed) Labs Reviewed  COMPREHENSIVE  METABOLIC PANEL - Abnormal; Notable for the following components:      Result Value   Glucose, Bld 257 (*)    Calcium 8.8 (*)    AST 13 (*)    Alkaline Phosphatase 131 (*)    Total Bilirubin 1.3 (*)    All other components within normal limits  CBC - Abnormal; Notable for the following components:   WBC 17.0 (*)    RBC 5.21 (*)    Hemoglobin 15.5 (*)    All other components within normal limits  URINALYSIS, ROUTINE W REFLEX MICROSCOPIC - Abnormal; Notable for the following components:   APPearance CLOUDY (*)    Glucose, UA >=500 (*)    Hgb urine dipstick SMALL (*)    Ketones, ur 20 (*)    Protein, ur 30 (*)    Nitrite POSITIVE (*)    Leukocytes,Ua LARGE (*)    Bacteria, UA MANY (*)    All other components within normal limits  TROPONIN I (HIGH SENSITIVITY) - Abnormal; Notable for the following components:   Troponin I (High Sensitivity) 23 (*)    All other components within normal limits  TROPONIN I (HIGH SENSITIVITY) - Abnormal; Notable for the following components:   Troponin I (High Sensitivity) 22 (*)    All other components within normal limits  RESP PANEL BY RT-PCR (RSV, FLU A&B, COVID)  RVPGX2  URINE CULTURE  LIPASE, BLOOD  MAGNESIUM    EKG None  Radiology CT ABDOMEN PELVIS W CONTRAST  Result Date: 04/10/2023 CLINICAL DATA:  Vomiting and abdominal pain beginning yesterday. Suspected bowel obstruction. EXAM: CT ABDOMEN AND PELVIS WITH CONTRAST TECHNIQUE: Multidetector CT imaging of the abdomen and pelvis was performed using the standard protocol following bolus administration of intravenous contrast. RADIATION DOSE REDUCTION: This exam was performed according to the departmental dose-optimization program  which includes automated exposure control, adjustment of the mA and/or kV according to patient size and/or use of iterative reconstruction technique. CONTRAST:  OMNIPAQUE IOHEXOL 300 MG/ML  SOLN COMPARISON:  03/22/2023 and 01/13/2020 FINDINGS: Lower Chest: No acute findings. Hepatobiliary: No suspicious hepatic masses identified. Prior cholecystectomy. No evidence of biliary obstruction. Pancreas:  No mass or inflammatory changes. Spleen: Within normal limits in size. No suspicious splenic lesions identified. Adrenals/Urinary Tract: Ill-defined area of decreased contrast enhancement is seen in the posterior lower pole of the right kidney with the adjacent perinephric soft tissue stranding. This is consistent pyelonephritis. No evidence of renal mass or abscess. No evidence of ureteral calculi or hydronephrosis. Unremarkable unopacified urinary bladder. Stomach/Bowel: Large hiatal hernia shows no significant change. No evidence of obstruction, inflammatory process or abnormal fluid collections. Diverticulosis is seen mainly involving the sigmoid colon, however there is no evidence of diverticulitis. Vascular/Lymphatic: No pathologically enlarged lymph nodes. No acute vascular findings. Reproductive: Prior hysterectomy noted. Right adnexal mass measures 4.0 x 2.4 cm, and remains stable compared to prior studies dating back to 2021. This is consistent with benign etiology. No other pelvic masses or inflammatory process identified. No evidence of free fluid. Other:  None. Musculoskeletal:  No suspicious bone lesions identified. IMPRESSION: Mild right pyelonephritis. No evidence of renal abscess or hydronephrosis. Stable large hiatal hernia. Colonic diverticulosis, without radiographic evidence of diverticulitis. Right adnexal mass remains stable, consistent with benign etiology. Electronically Signed   By: Danae Orleans M.D.   On: 04/10/2023 17:46    Procedures Procedures    Medications Ordered in  ED Medications  ondansetron (ZOFRAN) injection 4 mg (has no administration in time range)  lactated ringers bolus 1,000 mL (0 mLs Intravenous Stopped 04/10/23 2006)  iohexol (OMNIPAQUE) 300 MG/ML solution 100 mL (100 mLs Intravenous Contrast Given 04/10/23 1639)  cefTRIAXone (ROCEPHIN) 1 g in sodium chloride 0.9 % 100 mL IVPB (0 g Intravenous Stopped 04/10/23 2006)    ED Course/ Medical Decision Making/ A&P                                 Medical Decision Making Amount and/or Complexity of Data Reviewed Labs: ordered. Radiology: ordered.  Risk Prescription drug management. Decision regarding hospitalization.   This patient presents to the ED for concern of nausea and vomiting, this involves an extensive number of treatment options, and is a complaint that carries with it a high risk of complications and morbidity.  The differential diagnosis includes GERD, gastritis, enteritis, SBO, colitis, pyelonephritis, dehydration, metabolic derangements   Co morbidities that complicate the patient evaluation  HTN, DM, GERD, hiatal hernia, prior GI bleed, arthritis   Additional history obtained:  Additional history obtained from patient's son External records from outside source obtained and reviewed including EMR   Lab Tests:  I Ordered, and personally interpreted labs.  The pertinent results include: Hyperglycemia present without evidence of DKA.  A leukocytosis is present.  Urinalysis shows evidence of infection.  Troponin slightly elevated but stable on repeat.   Imaging Studies ordered:  I ordered imaging studies including CT of abdomen pelvis I independently visualized and interpreted imaging which showed mild right pyelonephritis I agree with the radiologist interpretation   Cardiac Monitoring: / EKG:  The patient was maintained on a cardiac monitor.  I personally viewed and interpreted the cardiac monitored which showed an underlying rhythm of: Sinus rhythm  Problem List  / ED Course / Critical interventions / Medication management  Patient presenting for nausea, vomiting, and p.o. intolerance since yesterday.  On arrival in the ED, she is mildly tachycardic.  She is well-appearing on exam and currently denies any nausea.  Her abdomen is soft and without tenderness.  She states that she did have a bowel movement yesterday.  Given her fluid losses, IV fluids were ordered for hydration.  Laboratory workup was initiated.  Lab results notable for leukocytosis and evidence of UTI.  CT imaging shows right-sided pyelonephritis.  Ceftriaxone was ordered.  Given her advanced age, patient was admitted for further management. I ordered medication including IV fluids for hydration; ceftriaxone for pyelonephritis Reevaluation of the patient after these medicines showed that the patient improved I have reviewed the patients home medicines and have made adjustments as needed   Social Determinants of Health:  Has PCP         Final Clinical Impression(s) / ED Diagnoses Final diagnoses:  Pyelonephritis    Rx / DC Orders ED Discharge Orders     None         Gloris Manchester, MD 04/10/23 2028

## 2023-04-10 NOTE — ED Triage Notes (Signed)
Pt states she has been vomiting since yesterday.  Pt endorses weakness, fatigue, diaphoresis.   Non tender abdomen on palpation Last meal was 8/19 in the morning, unable to keep anything down since noon. Pt endorses frequent urination x2 weeks

## 2023-04-10 NOTE — H&P (Signed)
History and Physical    Kendra Miles:[id number] DOB: [birth year] DOA: 04/10/2023  PCP: Tollie Eth, NP  Patient coming from: Home  I have personally briefly reviewed patient's old medical records in [facility]  Chief Complaint: Nausea, vomiting  HPI: Kendra Miles is a 72 y.o. female with medical history significant for T2DM, HTN, HLD, GERD, hiatal hernia, prior UGI bleed who presented to the ED for evaluation of nausea and vomiting.  Patient was seen earlier this month on 8/1 for suprapubic pain.  She was diagnosed with UTI and treated with a course of Keflex.  She had some improvement in her pain after completing the antibiotics.  However since then she has developed recurrent suprapubic discomfort.  Yesterday she developed nausea, vomiting, fatigue, and diaphoresis.  Last episode of emesis was around 11 AM this morning.  She has had increased urinary frequency with some burning sensation as well.  ED Course  Labs/Imaging on admission: I have personally reviewed following labs and imaging studies.  Initial vitals showed BP 94/83, pulse 92, RR 17, temp 99.2 F, SpO2 96% on room air.  Labs show WBC 17.0, hemoglobin 15.5, platelets 344,000, sodium 136, potassium 4.0, bicarb 22, BUN 15, creatinine 0.80, serum glucose 257, AST 13, ALT 15, alk phos 131, T. bili 1.3, magnesium 2.2, troponin 23 > 22, lipase 18.  UA shows positive nitrites, large leukocytes, 6-10 RBCs, >50 WBCs, many bacteria on microscopy.  Urine culture ordered and pending.  SARS-CoV-2, influenza, RSV PCR are negative.  CT A/P with contrast shows right-sided pyelonephritis without evidence of renal abscess or hydronephrosis.  Stable large hiatal hernia, colonic diverticulosis, and stable right adnexal mass noted.  Patient was given 1 L LR, IV ceftriaxone.  The hospitalist service was consulted to admit for further evaluation and management.  Review of Systems: All systems reviewed and are negative except as  documented in history of present illness above.   Past Medical History:  Diagnosis Date   Acid reflux    Arthritis    Back pain 11/02/2020   Calcium deficiency 11/02/2020   Cholelithiasis with choledocholithiasis 02/2019   Diabetes (HCC)    Emesis, persistent 06/10/2021   Encounter to establish care with new doctor 11/02/2020   Fall 05/06/2021   Flatulence, eructation and gas pain 11/03/2020   Headache    Hiatal hernia    Hyperlipidemia    Lower extremity edema 11/02/2020   Menopausal symptoms 11/02/2020   Otitis media 08/30/2021   Pneumonia    Polyp of stomach and duodenum    Upper GI bleed 02/03/2021   Viral URI 08/23/2021    Past Surgical History:  Procedure Laterality Date   ABDOMINAL HYSTERECTOMY     partial   APPENDECTOMY     BIOPSY  03/08/2019   Procedure: BIOPSY;  Surgeon: Iva Boop, MD;  Location: [facility];  Service: Endoscopy;;   CHOLECYSTECTOMY N/A 03/09/2019   Procedure: laparoscopic cholecystectomy;  Surgeon: Griselda Miner, MD;  Location: [facility];  Service: General;  Laterality: N/A;   CYST REMOVAL TRUNK     ERCP N/A 03/08/2019   Procedure: ENDOSCOPIC RETROGRADE CHOLANGIOPANCREATOGRAPHY (ERCP);  Surgeon: Iva Boop, MD;  Location: [facility];  Service: Endoscopy;  Laterality: N/A;   ESOPHAGOGASTRODUODENOSCOPY (EGD) WITH PROPOFOL N/A 04/24/2019   Procedure: ESOPHAGOGASTRODUODENOSCOPY (EGD) WITH PROPOFOL;  Surgeon: Kerin Salen, MD;  Location: [facility];  Service: Gastroenterology;  Laterality: N/A;   HEMOSTASIS CLIP PLACEMENT  04/24/2019   Procedure: HEMOSTASIS CLIP PLACEMENT;  Surgeon:  Kerin Salen, MD;  Location: [facility];  Service: Gastroenterology;;   LAPAROSCOPIC NISSEN FUNDOPLICATION N/A 06/13/2021   Procedure: LAPAROSCOPIC HIATAL HERNIA REPAIR;  Surgeon: Quentin Ore, MD;  Location: [facility];  Service: General;  Laterality: N/A;   PANCREATIC STENT PLACEMENT  03/08/2019   Procedure: PANCREATIC STENT PLACEMENT;  Surgeon: Iva Boop, MD;   Location: [facility];  Service: Endoscopy;;   POLYPECTOMY  04/24/2019   Procedure: POLYPECTOMY;  Surgeon: Kerin Salen, MD;  Location: [facility];  Service: Gastroenterology;;   REMOVAL OF STONES  03/08/2019   Procedure: REMOVAL OF STONES;  Surgeon: Iva Boop, MD;  Location: [facility];  Service: Endoscopy;;   SPHINCTEROTOMY  03/08/2019   Procedure: Dennison Mascot;  Surgeon: Iva Boop, MD;  Location: [facility];  Service: Endoscopy;;   UPPER GI ENDOSCOPY  06/13/2021   Procedure: UPPER GI ENDOSCOPY;  Surgeon: Quentin Ore, MD;  Location: [facility];  Service: General;;    Social History:  reports that she has never smoked. She has never used smokeless tobacco. She reports that she does not drink alcohol and does not use drugs.  Allergies  Allergen Reactions   Ciprofloxacin Other (See Comments)    Stomach cramps    Guaifenesin Er Other (See Comments)    Mucinex - keeps pt awake all night    Penicillins Palpitations    Did it involve swelling of the face/tongue/throat, SOB, or low BP? No Did it involve sudden or severe rash/hives, skin peeling, or any reaction on the inside of your mouth or nose? No Did you need to seek medical attention at a hospital or doctor's office? No When did it last happen? [phone]     If all above answers are "NO", may proceed with cephalosporin use.     Family History  Problem Relation Age of Onset   Memory loss Mother    COPD Father      Prior to Admission medications   Medication Sig Start Date End Date Taking? Authorizing Provider  aspirin 81 MG chewable tablet Chew 1 tablet (81 mg total) by mouth daily. 11/02/20   Tollie Eth, NP  blood glucose meter kit and supplies Testing at least once per day (morning fasting), with option to test up to 4 times per day (morning fasting and as needed for symptoms) as needed for better control. Use only the strips that come in this kit with this meter- other strips will not work properly.  11/02/20   Tollie Eth, NP  calcium carbonate (OSCAL) 1500 (600 Ca) MG TABS tablet Take 0.4 tablets (600 mg total) by mouth daily. 11/02/20   Tollie Eth, NP  doxycycline (VIBRA-TABS) 100 MG tablet Take 1 tablet (100 mg total) by mouth 2 (two) times daily. 05/05/22   Tollie Eth, NP  emollient lotion Apply topically 2 (two) times daily as needed. Apply to legs as needed for itching and dryness. 11/02/20   Tollie Eth, NP  fluconazole (DIFLUCAN) 150 MG tablet Take one tablet by mouth at the first sign of symptoms of yeast. If no resolution, repeat dose in 72 hours. 05/05/22   Tollie Eth, NP  fluticasone (FLONASE) 50 MCG/ACT nasal spray SPRAY 2 SPRAYS INTO EACH NOSTRIL EVERY DAY 12/15/21   Early, Sung Amabile, NP  lisinopril (ZESTRIL) 20 MG tablet TAKE 1 TABLET BY MOUTH EVERY DAY 03/23/23   Early, Sung Amabile, NP  loratadine (CLARITIN) 10 MG tablet Take 1 tablet (10 mg total) by mouth daily. 02/04/21  Tollie Eth, NP  meloxicam (MOBIC) 7.5 MG tablet TAKE 1 TABLET BY MOUTH EVERY DAY 03/03/22   Early, Sung Amabile, NP  metFORMIN (GLUCOPHAGE) 500 MG tablet TAKE 1 TABLET BY MOUTH TWICE A DAY WITH FOOD 05/01/22   Early, Sung Amabile, NP  Multiple Vitamin (MULTIVITAMIN WITH MINERALS) TABS tablet Take 1 tablet by mouth daily. Women's 50+ Multivitamin    [provider]  Omega-3 Fatty Acids (FISH OIL PO) Take 2,000 mg by mouth daily.    [provider]  ondansetron (ZOFRAN) 8 MG tablet Take 0.5 tablets (4 mg total) by mouth every 8 (eight) hours as needed for nausea or vomiting. 02/03/22   Early, Sung Amabile, NP  pantoprazole (PROTONIX) 40 MG tablet Take 1 tablet (40 mg total) by mouth 2 (two) times daily. 05/05/22   Tollie Eth, NP  PARoxetine (PAXIL) 30 MG tablet TAKE 1 TABLET BY MOUTH EVERY DAY 01/18/23   Early, Sung Amabile, NP  rizatriptan (MAXALT) 10 MG tablet Take 1 tablet (10 mg total) by mouth as needed for migraine. May repeat in 2 hours if needed. Do not take more than 2 doses in 24 hours. 07/20/21   Tollie Eth, NP  rosuvastatin (CRESTOR) 20 MG tablet Take 1 tablet (20 mg total) by mouth daily. Please call office to schedule an appt for further refills. Thank you 03/13/23   Christell Constant, MD  Semaglutide, 1 MG/DOSE, 4 MG/3ML SOPN Inject 1 mg as directed once a week. 05/05/22   Tollie Eth, NP  triamcinolone cream (KENALOG) 0.1 % Apply topically 2 (two) times daily. Apply to itchy area 2 times a day 05/05/22   Tollie Eth, NP    Physical Exam: Vitals:   04/10/23 1630 04/10/23 1651 04/10/23 1810 04/10/23 2007  BP: 115/84  112/80 124/72  Pulse: 97  92 89  Resp: 17  (!) 24 17  Temp:  98 F (36.7 C)  98.5 F (36.9 C)  TempSrc:  Oral  Oral  SpO2: 90%  93% 95%  Weight:      Height:       Constitutional: Resting in bed, NAD, calm, comfortable Eyes: EOMI, lids and conjunctivae normal ENMT: Mucous membranes are moist. Posterior pharynx clear of any exudate or lesions.Normal dentition.  Neck: normal, supple, no masses. Respiratory: clear to auscultation bilaterally, no wheezing, no crackles. Normal respiratory effort. No accessory muscle use.  Cardiovascular: Regular rate and rhythm, no murmurs / rubs / gallops. No extremity edema. 2+ pedal pulses. Abdomen: no tenderness, no masses palpated.  Musculoskeletal: no clubbing / cyanosis. No joint deformity upper and lower extremities. Good ROM, no contractures. Normal muscle tone.  Skin: no rashes, lesions, ulcers. No induration Neurologic: Sensation intact. Strength 5/5 in all 4.  Psychiatric: Normal judgment and insight. Alert and oriented x 3. Normal mood.   EKG: Personally reviewed. Sinus arrhythmia, rate 101, RBBB.  No acute ischemic changes.  Similar to previous.  Assessment/Plan Principal Problem:   Pyelonephritis of right kidney Active Problems:   Hypertension associated with diabetes (HCC)   Type 2 diabetes mellitus (HCC)   GERD (gastroesophageal reflux disease)   Hyperlipidemia associated with type 2 diabetes mellitus (HCC)    IFRA URIVE is a 72 y.o. female with medical history significant for T2DM, HTN, HLD, GERD, hiatal hernia, prior UGI bleed who is admitted with right-sided pyelonephritis.  Assessment and Plan: Right-sided pyelonephritis: Presenting with leukocytosis, suprapubic pain with dysuria, and CT consistent with right-sided pyelonephritis. -Continue empiric IV  ceftriaxone -Follow urine culture -Continue IV fluid hydration overnight  Type 2 diabetes: Holding home meds, placed on SSI.  Hypertension: Continue lisinopril.  Hyperlipidemia: Continue rosuvastatin.  GERD: Continue Protonix.   DVT prophylaxis: enoxaparin (LOVENOX) injection 40 mg Start: 04/10/23 2200 Code Status: Full code, confirmed with patient on admission Family Communication: Discussed with patient, she has discussed with family Disposition Plan: From home and likely discharge to home pending clinical progress Consults called: None Severity of Illness: The appropriate patient status for this patient is OBSERVATION. Observation status is judged to be reasonable and necessary in order to provide the required intensity of service to ensure the patient's safety. The patient's presenting symptoms, physical exam findings, and initial radiographic and laboratory data in the context of their medical condition is felt to place them at decreased risk for further clinical deterioration. Furthermore, it is anticipated that the patient will be medically stable for discharge from the hospital within 2 midnights of admission.   Darreld Mclean MD [facility]  If 7PM-7AM, please contact night-coverage [email or web address]  04/10/2023, 9:06 PM

## 2023-04-10 NOTE — Hospital Course (Signed)
Kendra Miles is a 72 y.o. female with medical history significant for T2DM, HTN, HLD, GERD, hiatal hernia, prior UGI bleed who is admitted with right-sided pyelonephritis.

## 2023-04-11 DIAGNOSIS — I152 Hypertension secondary to endocrine disorders: Secondary | ICD-10-CM | POA: Diagnosis present

## 2023-04-11 DIAGNOSIS — Z1152 Encounter for screening for COVID-19: Secondary | ICD-10-CM | POA: Diagnosis not present

## 2023-04-11 DIAGNOSIS — E785 Hyperlipidemia, unspecified: Secondary | ICD-10-CM | POA: Diagnosis present

## 2023-04-11 DIAGNOSIS — K5730 Diverticulosis of large intestine without perforation or abscess without bleeding: Secondary | ICD-10-CM | POA: Diagnosis present

## 2023-04-11 DIAGNOSIS — N12 Tubulo-interstitial nephritis, not specified as acute or chronic: Secondary | ICD-10-CM | POA: Diagnosis not present

## 2023-04-11 DIAGNOSIS — K449 Diaphragmatic hernia without obstruction or gangrene: Secondary | ICD-10-CM | POA: Diagnosis present

## 2023-04-11 DIAGNOSIS — Z791 Long term (current) use of non-steroidal anti-inflammatories (NSAID): Secondary | ICD-10-CM | POA: Diagnosis not present

## 2023-04-11 DIAGNOSIS — Z7984 Long term (current) use of oral hypoglycemic drugs: Secondary | ICD-10-CM | POA: Diagnosis not present

## 2023-04-11 DIAGNOSIS — Z9049 Acquired absence of other specified parts of digestive tract: Secondary | ICD-10-CM | POA: Diagnosis not present

## 2023-04-11 DIAGNOSIS — Z90711 Acquired absence of uterus with remaining cervical stump: Secondary | ICD-10-CM | POA: Diagnosis not present

## 2023-04-11 DIAGNOSIS — K219 Gastro-esophageal reflux disease without esophagitis: Secondary | ICD-10-CM | POA: Diagnosis present

## 2023-04-11 DIAGNOSIS — Z79899 Other long term (current) drug therapy: Secondary | ICD-10-CM | POA: Diagnosis not present

## 2023-04-11 DIAGNOSIS — Z880 Allergy status to penicillin: Secondary | ICD-10-CM | POA: Diagnosis not present

## 2023-04-11 DIAGNOSIS — Z7982 Long term (current) use of aspirin: Secondary | ICD-10-CM | POA: Diagnosis not present

## 2023-04-11 DIAGNOSIS — E58 Dietary calcium deficiency: Secondary | ICD-10-CM | POA: Diagnosis present

## 2023-04-11 DIAGNOSIS — R112 Nausea with vomiting, unspecified: Secondary | ICD-10-CM | POA: Diagnosis present

## 2023-04-11 DIAGNOSIS — Z8719 Personal history of other diseases of the digestive system: Secondary | ICD-10-CM | POA: Diagnosis not present

## 2023-04-11 DIAGNOSIS — E876 Hypokalemia: Secondary | ICD-10-CM | POA: Diagnosis present

## 2023-04-11 DIAGNOSIS — Z881 Allergy status to other antibiotic agents status: Secondary | ICD-10-CM | POA: Diagnosis not present

## 2023-04-11 DIAGNOSIS — Z888 Allergy status to other drugs, medicaments and biological substances status: Secondary | ICD-10-CM | POA: Diagnosis not present

## 2023-04-11 DIAGNOSIS — E1169 Type 2 diabetes mellitus with other specified complication: Secondary | ICD-10-CM | POA: Diagnosis present

## 2023-04-11 DIAGNOSIS — Z825 Family history of asthma and other chronic lower respiratory diseases: Secondary | ICD-10-CM | POA: Diagnosis not present

## 2023-04-11 DIAGNOSIS — B961 Klebsiella pneumoniae [K. pneumoniae] as the cause of diseases classified elsewhere: Secondary | ICD-10-CM | POA: Diagnosis present

## 2023-04-11 LAB — CBC
HCT: 41.9 % (ref 36.0–46.0)
Hemoglobin: 13.6 g/dL (ref 12.0–15.0)
MCH: 29.3 pg (ref 26.0–34.0)
MCHC: 32.5 g/dL (ref 30.0–36.0)
MCV: 90.3 fL (ref 80.0–100.0)
Platelets: 288 10*3/uL (ref 150–400)
RBC: 4.64 MIL/uL (ref 3.87–5.11)
RDW: 13.2 % (ref 11.5–15.5)
WBC: 12.9 10*3/uL — ABNORMAL HIGH (ref 4.0–10.5)
nRBC: 0 % (ref 0.0–0.2)

## 2023-04-11 LAB — BASIC METABOLIC PANEL
Anion gap: 13 (ref 5–15)
BUN: 15 mg/dL (ref 8–23)
CO2: 24 mmol/L (ref 22–32)
Calcium: 8.6 mg/dL — ABNORMAL LOW (ref 8.9–10.3)
Chloride: 100 mmol/L (ref 98–111)
Creatinine, Ser: 0.78 mg/dL (ref 0.44–1.00)
GFR, Estimated: >60 mL/min (ref >60)
Glucose, Bld: 166 mg/dL — ABNORMAL HIGH (ref 70–99)
Potassium: 3.4 mmol/L — ABNORMAL LOW (ref 3.5–5.1)
Sodium: 137 mmol/L (ref 135–145)

## 2023-04-11 LAB — HIV ANTIBODY (ROUTINE TESTING W REFLEX): HIV Screen 4th Generation wRfx: NONREACTIVE

## 2023-04-11 LAB — GLUCOSE, CAPILLARY
Glucose-Capillary: 158 mg/dL — ABNORMAL HIGH (ref 70–99)
Glucose-Capillary: 230 mg/dL — ABNORMAL HIGH (ref 70–99)
Glucose-Capillary: 170 mg/dL — ABNORMAL HIGH (ref 70–99)
Glucose-Capillary: 199 mg/dL — ABNORMAL HIGH (ref 70–99)

## 2023-04-11 MED ORDER — LACTATED RINGERS IV SOLN: INTRAVENOUS | Status: DC

## 2023-04-11 MED ORDER — POTASSIUM CHLORIDE CRYS ER 20 MEQ PO TBCR
40.0000 meq | EXTENDED_RELEASE_TABLET | Freq: Once | ORAL | Status: AC
  Administered 2023-04-11: 40 meq via ORAL
  Filled 2023-04-11: qty 2

## 2023-04-11 NOTE — TOC CM/SW Note (Signed)
Transition of Care [facility]) - Inpatient Brief Assessment   Patient Details  Name: Kendra Miles MRN: [id number] Date of Birth: 12/24/1950  Transition of Care [facility]) CM/SW Contact:    Howell Rucks, RN Phone Number: 04/11/2023, 11:42 AM   Clinical Narrative: met with pt at bedside to introduce role of TOC/NCM and review for dc planning. Pt reports she has a PCP and pharmacy in place, no current home care services or DME, reports she feels safe returning home with family support, family to provide transportation at discharge. TOC Brief Assessment completed. No TOC needs identified.     Transition of Care Asessment: Insurance and Status: Insurance coverage has been reviewed Patient has primary care physician: Yes Home environment has been reviewed: private residence with family support Prior level of function:: Independent Prior/Current Home Services: No current home services Social Determinants of Health Reivew: SDOH reviewed no interventions necessary Readmission risk has been reviewed: Yes Transition of care needs: no transition of care needs at this time

## 2023-04-11 NOTE — Progress Notes (Signed)
PROGRESS NOTE    Kendra Miles  KGM:[id number] DOB: [birth year] DOA: 04/10/2023 PCP: Tollie Eth, NP   Brief Narrative: 72 year old with past medical history significant for diabetes type 2, hypertension, hyperlipidemia, GERD, hiatal hernia, prior history of upper GI bleed presents for evaluation of nausea and vomiting.  Recent diagnosis for urinary tract infection and completed a course of Keflex at the beginning of the month.  She developed recurrent suprapubic discomfort, nausea vomiting.  Dysuria.  Found to have leukocytosis, UA with positive nitrates and large leukocytes.  CT abdomen and pelvis show right-sided pyelonephritis.   Assessment & Plan:   Principal Problem:   Pyelonephritis of right kidney Active Problems:   Hypertension associated with diabetes (HCC)   Type 2 diabetes mellitus (HCC)   GERD (gastroesophageal reflux disease)   Hyperlipidemia associated with type 2 diabetes mellitus (HCC)  1-Pyelonephritis Right side: -Present with flank pain, suprapubic pain, dysuria, CT with finding consistent of right-sided Pyelonephritis.  -UA with large leukocytes. -Follow urine culture and blood cultures. -Continue with IV fluids and IV ceftriaxone -WBC trending down.   2-Diabetes type 2: Continue with a sliding scale insulin  Hypertension: Systolic blood pressure soft, will hold lisinopril  Hyperlipidemia: Continue with rosuvastatin  GERD: Continue with PPI  Hypokalemia: Replete orally    Estimated body mass index is 30.64 kg/m as calculated from the following:   Height as of this encounter: 5\' 6"  (1.676 m).   Weight as of this encounter: 86.1 kg.   DVT prophylaxis: Lovenox Code Status: Full code Family Communication: Care discussed with patient.  Disposition Plan:  Status is: Observation The patient will require care spanning > 2 midnights and should be moved to inpatient because: management of pyelonephritis.     Consultants:  None  Procedures:   none  Antimicrobials:    Subjective: She is still having supra-pubic pain, dysuria.    Objective: Vitals:   04/10/23 2153 04/11/23 0143 04/11/23 0300 04/11/23 0805  BP: 118/60 102/67 107/75 109/70  Pulse: 90 90 78 80  Resp: 19 20 18 16   Temp: 98.9 F (37.2 C) (!) 101.4 F (38.6 C) 98.7 F (37.1 C) 98.2 F (36.8 C)  TempSrc: Oral Oral Oral Oral  SpO2: 98% 99% 94% 94%  Weight:      Height:        Intake/Output Summary (Last 24 hours) at 04/11/2023 0916 Last data filed at 04/10/2023 2006 Gross per 24 hour  Intake 1100 ml  Output --  Net 1100 ml   Filed Weights   04/10/23 1259  Weight: 86.1 kg    Examination:  General exam: Appears calm and comfortable  Respiratory system: Clear to auscultation. Respiratory effort normal. Cardiovascular system: S1 & S2 heard, RRR. No JVD, murmurs, rubs, gallops or clicks. No pedal edema. Gastrointestinal system: Abdomen is nondistended, soft and nontender. No organomegaly or masses felt. Normal bowel sounds heard. Central nervous system: Alert and oriented. No focal neurological deficits. Extremities: Symmetric 5 x 5 power.    Data Reviewed: I have personally reviewed following labs and imaging studies  CBC: Recent Labs  Lab 04/10/23 1408 04/11/23 0415  WBC 17.0* 12.9*  HGB 15.5* 13.6  HCT 45.9 41.9  MCV 88.1 90.3  PLT 344 288   Basic Metabolic Panel: Recent Labs  Lab 04/10/23 1408 04/10/23 1559 04/11/23 0415  NA 136  --  137  K 4.0  --  3.4*  CL 99  --  100  CO2 22  --  24  GLUCOSE  257*  --  166*  BUN 15  --  15  CREATININE 0.80  --  0.78  CALCIUM 8.8*  --  8.6*  MG  --  2.2  --    GFR: Estimated Creatinine Clearance: 72.3 mL/min (by C-G formula based on SCr of 0.78 mg/dL). Liver Function Tests: Recent Labs  Lab 04/10/23 1408  AST 13*  ALT 15  ALKPHOS 131*  BILITOT 1.3*  PROT 7.2  ALBUMIN 3.8   Recent Labs  Lab 04/10/23 1408  LIPASE 18   No results for input(s): "AMMONIA" in the last 168  hours. Coagulation Profile: No results for input(s): "INR", "PROTIME" in the last 168 hours. Cardiac Enzymes: No results for input(s): "CKTOTAL", "CKMB", "CKMBINDEX", "TROPONINI" in the last 168 hours. BNP (last 3 results) No results for input(s): "PROBNP" in the last 8760 hours. HbA1C: No results for input(s): "HGBA1C" in the last 72 hours. CBG: Recent Labs  Lab 04/10/23 2140 04/11/23 0741  GLUCAP 214* 158*   Lipid Profile: No results for input(s): "CHOL", "HDL", "LDLCALC", "TRIG", "CHOLHDL", "LDLDIRECT" in the last 72 hours. Thyroid Function Tests: No results for input(s): "TSH", "T4TOTAL", "FREET4", "T3FREE", "THYROIDAB" in the last 72 hours. Anemia Panel: No results for input(s): "VITAMINB12", "FOLATE", "FERRITIN", "TIBC", "IRON", "RETICCTPCT" in the last 72 hours. Sepsis Labs: No results for input(s): "PROCALCITON", "LATICACIDVEN" in the last 168 hours.  Recent Results (from the past 240 hour(s))  Resp panel by RT-PCR (RSV, Flu A&B, Covid) Anterior Nasal Swab     Status: None   Collection Time: 04/10/23  3:59 PM   Specimen: Anterior Nasal Swab  Result Value Ref Range Status   SARS Coronavirus 2 by RT PCR NEGATIVE NEGATIVE Final    Comment: (NOTE) SARS-CoV-2 target nucleic acids are NOT DETECTED.  The SARS-CoV-2 RNA is generally detectable in upper respiratory specimens during the acute phase of infection. The lowest concentration of SARS-CoV-2 viral copies this assay can detect is 138 copies/mL. A negative result does not preclude SARS-Cov-2 infection and should not be used as the sole basis for treatment or other patient management decisions. A negative result may occur with  improper specimen collection/handling, submission of specimen other than nasopharyngeal swab, presence of viral mutation(s) within the areas targeted by this assay, and inadequate number of viral copies(<138 copies/mL). A negative result must be combined with clinical observations, patient  history, and epidemiological information. The expected result is Negative.  Fact Sheet for Patients:  BloggerCourse.com  Fact Sheet for Healthcare Providers:  SeriousBroker.it  This test is no t yet approved or cleared by the [place] FDA and  has been authorized for detection and/or diagnosis of SARS-CoV-2 by FDA under an Emergency Use Authorization (EUA). This EUA will remain  in effect (meaning this test can be used) for the duration of the COVID-19 declaration under Section 564(b)(1) of the Act, 21 U.S.C.section 360bbb-3(b)(1), unless the authorization is terminated  or revoked sooner.       Influenza A by PCR NEGATIVE NEGATIVE Final   Influenza B by PCR NEGATIVE NEGATIVE Final    Comment: (NOTE) The Xpert Xpress SARS-CoV-2/FLU/RSV plus assay is intended as an aid in the diagnosis of influenza from Nasopharyngeal swab specimens and should not be used as a sole basis for treatment. Nasal washings and aspirates are unacceptable for Xpert Xpress SARS-CoV-2/FLU/RSV testing.  Fact Sheet for Patients: BloggerCourse.com  Fact Sheet for Healthcare Providers: SeriousBroker.it  This test is not yet approved or cleared by the [place] FDA and has been  authorized for detection and/or diagnosis of SARS-CoV-2 by FDA under an Emergency Use Authorization (EUA). This EUA will remain in effect (meaning this test can be used) for the duration of the COVID-19 declaration under Section 564(b)(1) of the Act, 21 U.S.C. section 360bbb-3(b)(1), unless the authorization is terminated or revoked.     Resp Syncytial Virus by PCR NEGATIVE NEGATIVE Final    Comment: (NOTE) Fact Sheet for Patients: BloggerCourse.com  Fact Sheet for Healthcare Providers: SeriousBroker.it  This test is not yet approved or cleared by the [place] FDA  and has been authorized for detection and/or diagnosis of SARS-CoV-2 by FDA under an Emergency Use Authorization (EUA). This EUA will remain in effect (meaning this test can be used) for the duration of the COVID-19 declaration under Section 564(b)(1) of the Act, 21 U.S.C. section 360bbb-3(b)(1), unless the authorization is terminated or revoked.  Performed at [facility], [address], [place], [place] [address]          Radiology Studies: CT ABDOMEN PELVIS W CONTRAST  Result Date: 04/10/2023 CLINICAL DATA:  Vomiting and abdominal pain beginning yesterday. Suspected bowel obstruction. EXAM: CT ABDOMEN AND PELVIS WITH CONTRAST TECHNIQUE: Multidetector CT imaging of the abdomen and pelvis was performed using the standard protocol following bolus administration of intravenous contrast. RADIATION DOSE REDUCTION: This exam was performed according to the departmental dose-optimization program which includes automated exposure control, adjustment of the mA and/or kV according to patient size and/or use of iterative reconstruction technique. CONTRAST:  OMNIPAQUE IOHEXOL 300 MG/ML  SOLN COMPARISON:  03/22/2023 and 01/13/2020 FINDINGS: Lower Chest: No acute findings. Hepatobiliary: No suspicious hepatic masses identified. Prior cholecystectomy. No evidence of biliary obstruction. Pancreas:  No mass or inflammatory changes. Spleen: Within normal limits in size. No suspicious splenic lesions identified. Adrenals/Urinary Tract: Ill-defined area of decreased contrast enhancement is seen in the posterior lower pole of the right kidney with the adjacent perinephric soft tissue stranding. This is consistent pyelonephritis. No evidence of renal mass or abscess. No evidence of ureteral calculi or hydronephrosis. Unremarkable unopacified urinary bladder. Stomach/Bowel: Large hiatal hernia shows no significant change. No evidence of obstruction, inflammatory process or abnormal fluid  collections. Diverticulosis is seen mainly involving the sigmoid colon, however there is no evidence of diverticulitis. Vascular/Lymphatic: No pathologically enlarged lymph nodes. No acute vascular findings. Reproductive: Prior hysterectomy noted. Right adnexal mass measures 4.0 x 2.4 cm, and remains stable compared to prior studies dating back to 2021. This is consistent with benign etiology. No other pelvic masses or inflammatory process identified. No evidence of free fluid. Other:  None. Musculoskeletal:  No suspicious bone lesions identified. IMPRESSION: Mild right pyelonephritis. No evidence of renal abscess or hydronephrosis. Stable large hiatal hernia. Colonic diverticulosis, without radiographic evidence of diverticulitis. Right adnexal mass remains stable, consistent with benign etiology. Electronically Signed   By: Danae Orleans M.D.   On: 04/10/2023 17:46        Scheduled Meds:  enoxaparin (LOVENOX) injection  40 mg Subcutaneous Q24H   insulin aspart  0-15 Units Subcutaneous TID WC   insulin aspart  0-5 Units Subcutaneous QHS   lisinopril  20 mg Oral Daily   pantoprazole  40 mg Oral BID   PARoxetine  30 mg Oral Daily   potassium chloride  40 mEq Oral Once   rosuvastatin  20 mg Oral Daily   Continuous Infusions:  cefTRIAXone (ROCEPHIN)  IV       LOS: 0 days    Time spent: 35 minutes  Alba Cory, MD [facility]   If 7PM-7AM, please contact night-coverage [email or web address]  04/11/2023, 9:16 AM

## 2023-04-11 NOTE — Plan of Care (Signed)
  Problem: Health Behavior/Discharge Planning: Goal: Ability to manage health-related needs will improve Outcome: Progressing   

## 2023-04-12 DIAGNOSIS — N12 Tubulo-interstitial nephritis, not specified as acute or chronic: Secondary | ICD-10-CM | POA: Diagnosis not present

## 2023-04-12 LAB — URINE CULTURE: Culture: >=100000 — AB

## 2023-04-12 LAB — CBC
HCT: 42 % (ref 36.0–46.0)
Hemoglobin: 13.8 g/dL (ref 12.0–15.0)
MCH: 29.8 pg (ref 26.0–34.0)
MCHC: 32.9 g/dL (ref 30.0–36.0)
MCV: 90.7 fL (ref 80.0–100.0)
Platelets: 278 10*3/uL (ref 150–400)
RBC: 4.63 MIL/uL (ref 3.87–5.11)
RDW: 13.2 % (ref 11.5–15.5)
WBC: 9.4 10*3/uL (ref 4.0–10.5)
nRBC: 0 % (ref 0.0–0.2)

## 2023-04-12 LAB — BASIC METABOLIC PANEL
Anion gap: 9 (ref 5–15)
BUN: 13 mg/dL (ref 8–23)
CO2: 26 mmol/L (ref 22–32)
Calcium: 8.3 mg/dL — ABNORMAL LOW (ref 8.9–10.3)
Chloride: 101 mmol/L (ref 98–111)
Creatinine, Ser: 0.67 mg/dL (ref 0.44–1.00)
GFR, Estimated: >60 mL/min (ref >60)
Glucose, Bld: 190 mg/dL — ABNORMAL HIGH (ref 70–99)
Potassium: 3.8 mmol/L (ref 3.5–5.1)
Sodium: 136 mmol/L (ref 135–145)

## 2023-04-12 LAB — GLUCOSE, CAPILLARY
Glucose-Capillary: 193 mg/dL — ABNORMAL HIGH (ref 70–99)
Glucose-Capillary: 220 mg/dL — ABNORMAL HIGH (ref 70–99)

## 2023-04-12 MED ORDER — ONDANSETRON HCL 4 MG PO TABS: 4.0000 mg | ORAL_TABLET | Freq: Four times a day (QID) | ORAL | 0 refills | Status: DC | PRN

## 2023-04-12 MED ORDER — SULFAMETHOXAZOLE-TRIMETHOPRIM 800-160 MG PO TABS
1.0000 | ORAL_TABLET | Freq: Two times a day (BID) | ORAL | Status: DC
  Administered 2023-04-12: 1 via ORAL
  Filled 2023-04-12: qty 1

## 2023-04-12 MED ORDER — SULFAMETHOXAZOLE-TRIMETHOPRIM 800-160 MG PO TABS: 1.0000 | ORAL_TABLET | Freq: Two times a day (BID) | ORAL | 0 refills | Status: AC

## 2023-04-12 MED ORDER — SACCHAROMYCES BOULARDII 250 MG PO CAPS: 250.0000 mg | ORAL_CAPSULE | Freq: Two times a day (BID) | ORAL | Status: DC

## 2023-04-12 MED ORDER — FLUCONAZOLE 150 MG PO TABS: ORAL_TABLET | ORAL | 2 refills | Status: DC

## 2023-04-12 MED ORDER — SACCHAROMYCES BOULARDII 250 MG PO CAPS: 250.0000 mg | ORAL_CAPSULE | Freq: Two times a day (BID) | ORAL | 0 refills | Status: AC

## 2023-04-12 NOTE — Discharge Instructions (Signed)
Please follow up with PCP to make sure urine infection resolved. You might need repeat labs, and urine sample.

## 2023-04-12 NOTE — Discharge Summary (Signed)
Physician Discharge Summary   Patient: Kendra Miles MRN: [id number] DOB: October 24, 1950  Admit date:     04/10/2023  Discharge date: 04/12/23  Discharge Physician: Alba Cory   PCP: Tollie Eth, NP   Recommendations at discharge:    Follow up resolution of UTI  Discharge Diagnoses: Principal Problem:   Pyelonephritis of right kidney Active Problems:   Hypertension associated with diabetes (HCC)   Type 2 diabetes mellitus (HCC)   GERD (gastroesophageal reflux disease)   Hyperlipidemia associated with type 2 diabetes mellitus (HCC)  Resolved Problems:   * No resolved hospital problems. *  Hospital Course: 72 year old with past medical history significant for diabetes type 2, hypertension, hyperlipidemia, GERD, hiatal hernia, prior history of upper GI bleed presents for evaluation of nausea and vomiting.  Recent diagnosis for urinary tract infection and completed a course of Keflex at the beginning of the month.  She developed recurrent suprapubic discomfort, nausea vomiting.  Dysuria.  Found to have leukocytosis, UA with positive nitrates and large leukocytes.  CT abdomen and pelvis show right-sided pyelonephritis.      Assessment and Plan: 1-Pyelonephritis Right side: -Present with flank pain, suprapubic pain, dysuria, CT with finding consistent of right-sided Pyelonephritis.  -UA with large leukocytes. -Follow urine culture grew klebsiella Pneumoniae.  -Treated  with IV fluids and IV ceftriaxone -WBC trending down.   Discharge on Bactrim for 9 days.   2-Diabetes type 2: Continue with a sliding scale insulin  Resume home regimen.   Hypertension: Resume lisinopril at discharge.    Hyperlipidemia: Continue with rosuvastatin   GERD: Continue with PPI   Hypokalemia: Replaced.            Consultants: None Procedures performed: none  Disposition: Home Diet recommendation:  Discharge Diet Orders (From admission, onward)     Start     Ordered   04/12/23  0000  Diet - low sodium heart healthy        04/12/23 1153           Carb modified diet DISCHARGE MEDICATION: Allergies as of 04/12/2023       Reactions   Ciprofloxacin Other (See Comments)   Stomach cramps   Guaifenesin Er Other (See Comments)   Mucinex - keeps pt awake all night    Penicillins Palpitations   Did it involve swelling of the face/tongue/throat, SOB, or low BP? No Did it involve sudden or severe rash/hives, skin peeling, or any reaction on the inside of your mouth or nose? No Did you need to seek medical attention at a hospital or doctor's office? No When did it last happen? [phone]     If all above answers are "NO", may proceed with cephalosporin use.        Medication List     STOP taking these medications    doxycycline 100 MG tablet Commonly known as: VIBRA-TABS   emollient lotion   Ozempic (1 MG/DOSE) 4 MG/3ML Sopn Generic drug: Semaglutide (1 MG/DOSE)   triamcinolone cream 0.1 % Commonly known as: KENALOG       TAKE these medications    Aspirin Low Dose 81 MG chewable tablet Generic drug: aspirin Chew 1 tablet (81 mg total) by mouth daily.   blood glucose meter kit and supplies Testing at least once per day (morning fasting), with option to test up to 4 times per day (morning fasting and as needed for symptoms) as needed for better control. Use only the strips that come in this kit with  this meter- other strips will not work properly.   calcium carbonate 1500 (600 Ca) MG Tabs tablet Commonly known as: OSCAL Take 0.4 tablets (600 mg total) by mouth daily.   FISH OIL PO Take 2,000 mg by mouth daily.   fluconazole 150 MG tablet Commonly known as: DIFLUCAN Take one tablet by mouth at the first sign of symptoms of yeast. If no resolution, repeat dose in 72 hours.   fluticasone 50 MCG/ACT nasal spray Commonly known as: FLONASE SPRAY 2 SPRAYS INTO EACH NOSTRIL EVERY DAY What changed: See the new instructions.   lisinopril 20 MG  tablet Commonly known as: ZESTRIL TAKE 1 TABLET BY MOUTH EVERY DAY   loratadine 10 MG tablet Commonly known as: CLARITIN Take 1 tablet (10 mg total) by mouth daily.   meloxicam 7.5 MG tablet Commonly known as: MOBIC TAKE 1 TABLET BY MOUTH EVERY DAY   metFORMIN 500 MG tablet Commonly known as: GLUCOPHAGE TAKE 1 TABLET BY MOUTH TWICE A DAY WITH FOOD What changed: when to take this   multivitamin with minerals Tabs tablet Take 1 tablet by mouth daily. Women's 50+ Multivitamin   ondansetron 4 MG tablet Commonly known as: ZOFRAN Take 1 tablet (4 mg total) by mouth every 6 (six) hours as needed for nausea. What changed:  medication strength when to take this reasons to take this   pantoprazole 40 MG tablet Commonly known as: PROTONIX Take 1 tablet (40 mg total) by mouth 2 (two) times daily. What changed: when to take this   PARoxetine 30 MG tablet Commonly known as: PAXIL TAKE 1 TABLET BY MOUTH EVERY DAY   rizatriptan 10 MG tablet Commonly known as: Maxalt Take 1 tablet (10 mg total) by mouth as needed for migraine. May repeat in 2 hours if needed. Do not take more than 2 doses in 24 hours.   rosuvastatin 20 MG tablet Commonly known as: CRESTOR Take 1 tablet (20 mg total) by mouth daily. Please call office to schedule an appt for further refills. Thank you   saccharomyces boulardii 250 MG capsule Commonly known as: FLORASTOR Take 1 capsule (250 mg total) by mouth 2 (two) times daily for 10 days.   sulfamethoxazole-trimethoprim 800-160 MG tablet Commonly known as: BACTRIM DS Take 1 tablet by mouth every 12 (twelve) hours for 9 days.        Follow-up Information     Early, Sung Amabile, NP Follow up in 1 week(s).   Specialty: Nurse Practitioner Contact information: [address] [place] [address] [place] [address] [phone]                Discharge Exam: Ceasar Mons Weights   04/10/23 1259  Weight: 86.1 kg   General; NAD  Condition at discharge:  stable  The results of significant diagnostics from this hospitalization (including imaging, microbiology, ancillary and laboratory) are listed below for reference.   Imaging Studies: CT ABDOMEN PELVIS W CONTRAST  Result Date: 04/10/2023 CLINICAL DATA:  Vomiting and abdominal pain beginning yesterday. Suspected bowel obstruction. EXAM: CT ABDOMEN AND PELVIS WITH CONTRAST TECHNIQUE: Multidetector CT imaging of the abdomen and pelvis was performed using the standard protocol following bolus administration of intravenous contrast. RADIATION DOSE REDUCTION: This exam was performed according to the departmental dose-optimization program which includes automated exposure control, adjustment of the mA and/or kV according to patient size and/or use of iterative reconstruction technique. CONTRAST:  OMNIPAQUE IOHEXOL 300 MG/ML  SOLN COMPARISON:  03/22/2023 and 01/13/2020 FINDINGS: Lower Chest: No acute findings. Hepatobiliary:  No suspicious hepatic masses identified. Prior cholecystectomy. No evidence of biliary obstruction. Pancreas:  No mass or inflammatory changes. Spleen: Within normal limits in size. No suspicious splenic lesions identified. Adrenals/Urinary Tract: Ill-defined area of decreased contrast enhancement is seen in the posterior lower pole of the right kidney with the adjacent perinephric soft tissue stranding. This is consistent pyelonephritis. No evidence of renal mass or abscess. No evidence of ureteral calculi or hydronephrosis. Unremarkable unopacified urinary bladder. Stomach/Bowel: Large hiatal hernia shows no significant change. No evidence of obstruction, inflammatory process or abnormal fluid collections. Diverticulosis is seen mainly involving the sigmoid colon, however there is no evidence of diverticulitis. Vascular/Lymphatic: No pathologically enlarged lymph nodes. No acute vascular findings. Reproductive: Prior hysterectomy noted. Right adnexal mass measures 4.0 x 2.4 cm, and  remains stable compared to prior studies dating back to 2021. This is consistent with benign etiology. No other pelvic masses or inflammatory process identified. No evidence of free fluid. Other:  None. Musculoskeletal:  No suspicious bone lesions identified. IMPRESSION: Mild right pyelonephritis. No evidence of renal abscess or hydronephrosis. Stable large hiatal hernia. Colonic diverticulosis, without radiographic evidence of diverticulitis. Right adnexal mass remains stable, consistent with benign etiology. Electronically Signed   By: Danae Orleans M.D.   On: 04/10/2023 17:46   CT ABDOMEN PELVIS W CONTRAST  Result Date: 03/22/2023 CLINICAL DATA:  Lower abdominal pain EXAM: CT ABDOMEN AND PELVIS WITH CONTRAST TECHNIQUE: Multidetector CT imaging of the abdomen and pelvis was performed using the standard protocol following bolus administration of intravenous contrast. RADIATION DOSE REDUCTION: This exam was performed according to the departmental dose-optimization program which includes automated exposure control, adjustment of the mA and/or kV according to patient size and/or use of iterative reconstruction technique. CONTRAST:  85mL OMNIPAQUE IOHEXOL 300 MG/ML  SOLN COMPARISON:  CT 06/10/2021 FINDINGS: Lower chest: There is some linear opacity lung bases likely scar or atelectasis. No pleural effusion. Large hiatal hernia. Hepatobiliary: Breathing motion seen throughout the examination. No space-occupying liver lesion. Patent portal vein. Gallbladder is surgically absent. Pancreas: Mild global atrophy of the pancreas. No obvious pancreatic mass. Spleen: Spleen is nonenlarged. There is small rim calcified cystic structure centrally on series 2, image 18 measuring 14 mm. Unchanged from previous. Small splenules. Adrenals/Urinary Tract: Slight nodular thickening of the left adrenal gland. Nonspecific. No specific imaging follow-up. The right adrenal gland is preserved. There is a Bosniak 2 posterior right-sided  renal cystic lesion. No specific imaging follow-up. No enhancing renal mass or collecting system dilatation. The ureters have normal course and caliber. Preserved contours of the urinary bladder with some mild wall thickening and stranding. Please correlate for any evidence of cystitis. Stomach/Bowel: No oral contrast. Scattered colonic stool. Sigmoid colon diverticula. Stomach and small bowel are nondilated. Once again there is a large hiatal hernia. Appendix is not seen in the right lower quadrant. No pericecal stranding or fluid. Vascular/Lymphatic: Aortic atherosclerosis. No enlarged abdominal or pelvic lymph nodes. Reproductive: Previous hysterectomy. Once again there is a complex cystic structure in the right adnexa with some wall thickening. When including the surrounding soft tissue thickening this measures 4.0 by 2.7 cm. Going back to the study of May 2021 this would have measured 4.2 by 2.3 cm when measured in same fashion demonstrating 3 years of stability. Possibly benign ovarian cystic lesion. Other: No free air or free fluid. Musculoskeletal: Scattered degenerative changes are seen along the spine. IMPRESSION: Large hiatal hernia. No bowel obstruction, free air or free fluid. Few colonic diverticula. Slight wall  thickening of the urinary bladder with some stranding. Please correlate for any clinical evidence of cystitis. Stable complex cystic lesion in the right adnexa, likely ovarian. Stable for 3 years. Motion Electronically Signed   By: Karen Kays M.D.   On: 03/22/2023 19:18    Microbiology: Results for orders placed or performed during the hospital encounter of 04/10/23  Resp panel by RT-PCR (RSV, Flu A&B, Covid) Anterior Nasal Swab     Status: None   Collection Time: 04/10/23  3:59 PM   Specimen: Anterior Nasal Swab  Result Value Ref Range Status   SARS Coronavirus 2 by RT PCR NEGATIVE NEGATIVE Final    Comment: (NOTE) SARS-CoV-2 target nucleic acids are NOT DETECTED.  The SARS-CoV-2  RNA is generally detectable in upper respiratory specimens during the acute phase of infection. The lowest concentration of SARS-CoV-2 viral copies this assay can detect is 138 copies/mL. A negative result does not preclude SARS-Cov-2 infection and should not be used as the sole basis for treatment or other patient management decisions. A negative result may occur with  improper specimen collection/handling, submission of specimen other than nasopharyngeal swab, presence of viral mutation(s) within the areas targeted by this assay, and inadequate number of viral copies(<138 copies/mL). A negative result must be combined with clinical observations, patient history, and epidemiological information. The expected result is Negative.  Fact Sheet for Patients:  BloggerCourse.com  Fact Sheet for Healthcare Providers:  SeriousBroker.it  This test is no t yet approved or cleared by the [place] FDA and  has been authorized for detection and/or diagnosis of SARS-CoV-2 by FDA under an Emergency Use Authorization (EUA). This EUA will remain  in effect (meaning this test can be used) for the duration of the COVID-19 declaration under Section 564(b)(1) of the Act, 21 U.S.C.section 360bbb-3(b)(1), unless the authorization is terminated  or revoked sooner.       Influenza A by PCR NEGATIVE NEGATIVE Final   Influenza B by PCR NEGATIVE NEGATIVE Final    Comment: (NOTE) The Xpert Xpress SARS-CoV-2/FLU/RSV plus assay is intended as an aid in the diagnosis of influenza from Nasopharyngeal swab specimens and should not be used as a sole basis for treatment. Nasal washings and aspirates are unacceptable for Xpert Xpress SARS-CoV-2/FLU/RSV testing.  Fact Sheet for Patients: BloggerCourse.com  Fact Sheet for Healthcare Providers: SeriousBroker.it  This test is not yet approved or cleared by the  [place] FDA and has been authorized for detection and/or diagnosis of SARS-CoV-2 by FDA under an Emergency Use Authorization (EUA). This EUA will remain in effect (meaning this test can be used) for the duration of the COVID-19 declaration under Section 564(b)(1) of the Act, 21 U.S.C. section 360bbb-3(b)(1), unless the authorization is terminated or revoked.     Resp Syncytial Virus by PCR NEGATIVE NEGATIVE Final    Comment: (NOTE) Fact Sheet for Patients: BloggerCourse.com  Fact Sheet for Healthcare Providers: SeriousBroker.it  This test is not yet approved or cleared by the [place] FDA and has been authorized for detection and/or diagnosis of SARS-CoV-2 by FDA under an Emergency Use Authorization (EUA). This EUA will remain in effect (meaning this test can be used) for the duration of the COVID-19 declaration under Section 564(b)(1) of the Act, 21 U.S.C. section 360bbb-3(b)(1), unless the authorization is terminated or revoked.  Performed at [facility], [address], [place], [place] [address]   Urine Culture     Status: Abnormal   Collection Time: 04/10/23  8:16 PM  Specimen: Urine, Clean Catch  Result Value Ref Range Status   Specimen Description   Final    URINE, CLEAN CATCH Performed at [facility], [address] [place], [place] [address]    Special Requests   Final    NONE Performed at [facility], [address], [place], [place] [address]    Culture >=100,000 COLONIES/mL KLEBSIELLA PNEUMONIAE (A)  Final   Report Status 04/12/2023 FINAL  Final   Organism ID, Bacteria KLEBSIELLA PNEUMONIAE (A)  Final      Susceptibility   Klebsiella pneumoniae - MIC*    AMPICILLIN >=32 RESISTANT Resistant     CEFAZOLIN <=4 SENSITIVE Sensitive     CEFEPIME <=0.12 SENSITIVE Sensitive     CEFTRIAXONE <=0.25 SENSITIVE Sensitive     CIPROFLOXACIN <=0.25  SENSITIVE Sensitive     GENTAMICIN <=1 SENSITIVE Sensitive     IMIPENEM 0.5 SENSITIVE Sensitive     NITROFURANTOIN 128 RESISTANT Resistant     TRIMETH/SULFA <=20 SENSITIVE Sensitive     AMPICILLIN/SULBACTAM 4 SENSITIVE Sensitive     PIP/TAZO <=4 SENSITIVE Sensitive     * >=100,000 COLONIES/mL KLEBSIELLA PNEUMONIAE    Labs: CBC: Recent Labs  Lab 04/10/23 1408 04/11/23 0415 04/12/23 0404  WBC 17.0* 12.9* 9.4  HGB 15.5* 13.6 13.8  HCT 45.9 41.9 42.0  MCV 88.1 90.3 90.7  PLT 344 288 278   Basic Metabolic Panel: Recent Labs  Lab 04/10/23 1408 04/10/23 1559 04/11/23 0415 04/12/23 0404  NA 136  --  137 136  K 4.0  --  3.4* 3.8  CL 99  --  100 101  CO2 22  --  24 26  GLUCOSE 257*  --  166* 190*  BUN 15  --  15 13  CREATININE 0.80  --  0.78 0.67  CALCIUM 8.8*  --  8.6* 8.3*  MG  --  2.2  --   --    Liver Function Tests: Recent Labs  Lab 04/10/23 1408  AST 13*  ALT 15  ALKPHOS 131*  BILITOT 1.3*  PROT 7.2  ALBUMIN 3.8   CBG: Recent Labs  Lab 04/11/23 1211 04/11/23 1657 04/11/23 2104 04/12/23 0759 04/12/23 1138  GLUCAP 230* 170* 199* 193* 220*    Discharge time spent: greater than 30 minutes.  Signed: Alba Cory, MD [facility] 04/12/2023

## 2023-04-12 NOTE — Progress Notes (Signed)
Mobility Specialist - Progress Note   04/12/23 0931  Mobility  Activity Ambulated with assistance in hallway  Level of Assistance Standby assist, set-up cues, supervision of patient - no hands on  Assistive Device Other (Comment) (Hallway Rails)  Distance Ambulated (ft) 350 ft  Range of Motion/Exercises Active  Activity Response Tolerated well  Mobility Referral Yes  $Mobility charge 1 Mobility  Mobility Specialist Start Time (ACUTE ONLY) 0920  Mobility Specialist Stop Time (ACUTE ONLY) 0930  Mobility Specialist Time Calculation (min) (ACUTE ONLY) 10 min   Pt was found in bed and agreeable to ambulate. No complaints with session. At EOS returned to bed with all needs met. Call bell in reach and daughter in law in room.   Billey Chang Mobility Specialist

## 2023-04-13 ENCOUNTER — Telehealth: Payer: Self-pay

## 2023-04-13 NOTE — Transitions of Care (Post Inpatient/ED Visit) (Signed)
   04/13/2023  Name: SHYASIA ZERMENO MRN: [id number] DOB: 1951-07-13  Today's TOC FU Call Status: Today's TOC FU Call Status:: Unsuccessful Call (1st Attempt) Unsuccessful Call (1st Attempt) Date: 04/13/23  Attempted to reach the patient regarding the most recent Inpatient/ED visit.  Follow Up Plan: Additional outreach attempts will be made to reach the patient to complete the Transitions of Care (Post Inpatient/ED visit) call.      Antionette Fairy, RN,BSN,CCM [facility] Care Management Care Management Community Coordinator Direct Phone: [phone] Toll Free: [phone] Fax: [phone]

## 2023-04-16 ENCOUNTER — Telehealth: Payer: Self-pay

## 2023-04-16 NOTE — Transitions of Care (Post Inpatient/ED Visit) (Signed)
   04/16/2023  Name: ANTON JAIN MRN: [id number] DOB: [birth year]  Today's TOC FU Call Status: Today's TOC FU Call Status:: Unsuccessful Call (3rd Attempt) Unsuccessful Call (3rd Attempt) Date: 04/16/23  Attempted to reach the patient regarding the most recent Inpatient/ED visit.  Follow Up Plan: No further outreach attempts will be made at this time. We have been unable to contact the patient.     Antionette Fairy, RN,BSN,CCM [facility] Care Management Care Management Community Coordinator Direct Phone: [phone] Toll Free: [phone] Fax: [phone]

## 2023-04-16 NOTE — Transitions of Care (Post Inpatient/ED Visit) (Signed)
   04/16/2023  Name: SHANTAYE SHAWLEY MRN: [id number] DOB: Sep 21, 1950  Today's TOC FU Call Status: Today's TOC FU Call Status:: Unsuccessful Call (2nd Attempt) Unsuccessful Call (2nd Attempt) Date: 04/16/23  Attempted to reach the patient regarding the most recent Inpatient/ED visit.  Follow Up Plan: Additional outreach attempts will be made to reach the patient to complete the Transitions of Care (Post Inpatient/ED visit) call.     Antionette Fairy, RN,BSN,CCM [facility] Care Management Care Management Community Coordinator Direct Phone: [phone] Toll Free: [phone] Fax: [phone]

## 2023-05-04 ENCOUNTER — Ambulatory Visit: Payer: Medicare Other | Admitting: Nurse Practitioner

## 2023-05-04 ENCOUNTER — Encounter: Payer: Self-pay | Admitting: Nurse Practitioner

## 2023-05-04 ENCOUNTER — Ambulatory Visit
Admission: RE | Admit: 2023-05-04 | Discharge: 2023-05-04 | Disposition: A | Discharge status: Home / Self Care | Payer: Medicare Other | Source: Ambulatory Visit | Attending: Nurse Practitioner | Admitting: Nurse Practitioner

## 2023-05-04 VITALS — BP 128/78 | HR 61 | Wt 186.6 lb

## 2023-05-04 DIAGNOSIS — Z1231 Encounter for screening mammogram for malignant neoplasm of breast: Secondary | ICD-10-CM

## 2023-05-04 DIAGNOSIS — M25551 Pain in right hip: Secondary | ICD-10-CM

## 2023-05-04 DIAGNOSIS — E2839 Other primary ovarian failure: Secondary | ICD-10-CM

## 2023-05-04 DIAGNOSIS — Z13820 Encounter for screening for osteoporosis: Secondary | ICD-10-CM

## 2023-05-04 DIAGNOSIS — M5441 Lumbago with sciatica, right side: Secondary | ICD-10-CM

## 2023-05-04 DIAGNOSIS — E1165 Type 2 diabetes mellitus with hyperglycemia: Secondary | ICD-10-CM

## 2023-05-04 DIAGNOSIS — W19XXXA Unspecified fall, initial encounter: Secondary | ICD-10-CM

## 2023-05-04 DIAGNOSIS — N12 Tubulo-interstitial nephritis, not specified as acute or chronic: Secondary | ICD-10-CM | POA: Diagnosis not present

## 2023-05-04 DIAGNOSIS — Z23 Encounter for immunization: Secondary | ICD-10-CM | POA: Diagnosis not present

## 2023-05-04 DIAGNOSIS — N951 Menopausal and female climacteric states: Secondary | ICD-10-CM

## 2023-05-04 DIAGNOSIS — K219 Gastro-esophageal reflux disease without esophagitis: Secondary | ICD-10-CM

## 2023-05-04 DIAGNOSIS — E1169 Type 2 diabetes mellitus with other specified complication: Secondary | ICD-10-CM

## 2023-05-04 DIAGNOSIS — I152 Hypertension secondary to endocrine disorders: Secondary | ICD-10-CM

## 2023-05-04 DIAGNOSIS — G8929 Other chronic pain: Secondary | ICD-10-CM

## 2023-05-04 DIAGNOSIS — I10 Essential (primary) hypertension: Secondary | ICD-10-CM

## 2023-05-04 DIAGNOSIS — E1159 Type 2 diabetes mellitus with other circulatory complications: Secondary | ICD-10-CM | POA: Diagnosis not present

## 2023-05-04 DIAGNOSIS — Z794 Long term (current) use of insulin: Secondary | ICD-10-CM

## 2023-05-04 MED ORDER — ASPIRIN 81 MG PO CHEW
81.0000 mg | CHEWABLE_TABLET | Freq: Every day | ORAL | 1 refills | Status: AC
Start: 2023-05-04 — End: ?

## 2023-05-04 MED ORDER — TOUJEO MAX SOLOSTAR 300 UNIT/ML ~~LOC~~ SOPN
14.0000 [IU] | PEN_INJECTOR | Freq: Every day | SUBCUTANEOUS | Status: DC
Start: 2023-05-04 — End: 2024-04-08

## 2023-05-04 MED ORDER — MELOXICAM 7.5 MG PO TABS
7.5000 mg | ORAL_TABLET | Freq: Every day | ORAL | 6 refills | Status: DC
Start: 2023-05-04 — End: 2023-08-06

## 2023-05-04 MED ORDER — LISINOPRIL 20 MG PO TABS
20.0000 mg | ORAL_TABLET | Freq: Every day | ORAL | 0 refills | Status: DC
Start: 2023-05-04 — End: 2023-06-26

## 2023-05-04 MED ORDER — ROSUVASTATIN CALCIUM 20 MG PO TABS: 20.0000 mg | ORAL_TABLET | Freq: Every day | ORAL | 0 refills | Status: DC

## 2023-05-04 MED ORDER — PAROXETINE HCL 30 MG PO TABS
30.0000 mg | ORAL_TABLET | Freq: Every day | ORAL | 3 refills | Status: DC
Start: 2023-05-04 — End: 2024-04-08

## 2023-05-04 MED ORDER — PANTOPRAZOLE SODIUM 40 MG PO TBEC
40.0000 mg | DELAYED_RELEASE_TABLET | Freq: Two times a day (BID) | ORAL | 3 refills | Status: DC
Start: 2023-05-04 — End: 2024-04-08

## 2023-05-04 MED ORDER — GLIPIZIDE 5 MG PO TABS
5.0000 mg | ORAL_TABLET | Freq: Every day | ORAL | 3 refills | Status: DC
Start: 2023-05-04 — End: 2023-08-06

## 2023-05-04 MED ORDER — METFORMIN HCL 500 MG PO TABS
500.0000 mg | ORAL_TABLET | Freq: Every day | ORAL | 3 refills | Status: DC
Start: 2023-05-04 — End: 2024-02-19

## 2023-05-04 NOTE — Progress Notes (Signed)
Tollie Eth, DNP, AGNP-c [facility] [address] [place], [place] [address] [phone]   ACUTE VISIT- ESTABLISHED PATIENT  Blood pressure 128/78, pulse 61, weight 186 lb 9.6 oz (84.6 kg).  Subjective:  HPI Kendra Miles is a 72 y.o. female presents to day for evaluation of acute concern(s).  She was recently seen in the ED for pyelonephritis. She tells me today that her pyelo symptoms have completely resolved and she is feeling much better.. She has been drinking more water.    She reports burning in chest and increased burping for the last few weeks. She is not on a PPI.   DM She tells me her blood sugars have been elevated. The readings from her monitor are listed for the last 10 days: [phone] [phone] [phone] 488-855p [phone] [phone] [id number] a [phone] [phone] [phone] a [phone] p She reports not following any specific diet. She is active at work, but no formal exercise routine. She does not feel her vision has worsened. She is not having increased hunger, thirst, or urination. No paresthesias reported.  We do need eye exam records.   Right hip pain She tells me a couple of months ago she fell on the floor at work and landed on her hip. It is still hurting at this time. She does not feel her ROM is reduced. The pain is with palpation.    Nose pain She reports a fracture of the nose Jan 18, 2022 from a fall. She reports this is still painful when her glasses rest on the nose.   ROS negative except for what is listed in HPI. History, Medications, Surgery, SDOH, and Family History reviewed and updated as appropriate.  Objective:  Physical Exam Vitals and nursing note reviewed.  Constitutional:      General: She is not in acute distress.    Appearance: She is obese. She is not ill-appearing.  HENT:     Head: Normocephalic.  Eyes:     Conjunctiva/sclera: Conjunctivae normal.  Neck:     Vascular: No carotid bruit.   Cardiovascular:     Rate and Rhythm: Normal rate and regular rhythm.     Pulses: Normal pulses.     Heart sounds: Normal heart sounds.  Pulmonary:     Effort: Pulmonary effort is normal.     Breath sounds: Normal breath sounds.  Abdominal:     General: Bowel sounds are normal.     Palpations: Abdomen is soft.  Musculoskeletal:        General: Tenderness present.     Right lower leg: No edema.     Left lower leg: No edema.     Comments: Tenderness with palpation to the right hip at the joint. No evidence of gross inflammation.   Skin:    General: Skin is warm and dry.     Capillary Refill: Capillary refill takes less than 2 seconds.  Neurological:     General: No focal deficit present.     Mental Status: She is alert and oriented to person, place, and time.     Sensory: No sensory deficit.     Motor: No weakness.     Coordination: Coordination normal.     Gait: Gait abnormal.  Psychiatric:        Behavior: Behavior normal.         Assessment & Plan:   Problem List Items Addressed This Visit     Hypertension associated with diabetes (HCC)    Chronic. BP stable No  alarm symptoms present at this time.  Currently managed with lisinopril. Goal blood pressure less than less than 130/80. Currently is not followed with cardiology.  Plan: current treatment plan is effective, no change in therapy, orders and follow up as documented in EpicCare.  Refills have been provided today. Labs today to check electrolytes and kidney function.  Will make changes to plan of care as necessary based on lab results.  Plan to follow-up in 3months.       Relevant Medications   rosuvastatin (CRESTOR) 20 MG tablet   aspirin 81 MG chewable tablet   lisinopril (ZESTRIL) 20 MG tablet   insulin glargine, 2 Unit Dial, (TOUJEO MAX SOLOSTAR) 300 UNIT/ML Solostar Pen   metFORMIN (GLUCOPHAGE) 500 MG tablet   glipiZIDE (GLUCOTROL) 5 MG tablet   Other Relevant Orders   Hemoglobin A1c (Completed)   CBC  with Differential/Platelet (Completed)   Comprehensive metabolic panel (Completed)   LP+LDL Direct (Completed)   Microalbumin/Creatinine Ratio, Urine (Completed)   Type 2 diabetes mellitus (HCC)    Chronic. BG poorly controlled course. Associated with Hypertension, Hyperlipidemia, Chronic Kidney Disease, and Hypothyroidism. No symptoms present today. Currently managed with glipizide ann metformin. Foot Exam HM Status: was completed today. Urine Micro HM Status: was completed today. A1c HM Status: was completed today. CMP HM Status: was completed today. Lipids HM Status: was completed today. Eye Exam HM Status: is reported as completed, but records are needed. Plan: Glucose monitoring once or twice daily with goals for AM fasting blood sugar 70-110 and 2 hours after meals blood sugar <150.  Diet should include 150-180 grams of carbohydrates, 20-30 grams of protein, 30 grams of fiber, and no more than 15 grams of saturated fat per day.  Exercise goals should include an average of 150 minutes of moderate intensity activity per week.  Blood pressure monitoring weekly with goal <130/80. Continue diabetes medications. We have added Toujeo today starting at 14u at bedtime with instructions to increase based on glucose readings average.  We have completed Ozempic patient assistance forms today. She will get the needed information so we can get this sent into the company.  Labs ordered. Plan to follow-up in 3months.          Relevant Medications   rosuvastatin (CRESTOR) 20 MG tablet   aspirin 81 MG chewable tablet   lisinopril (ZESTRIL) 20 MG tablet   insulin glargine, 2 Unit Dial, (TOUJEO MAX SOLOSTAR) 300 UNIT/ML Solostar Pen   metFORMIN (GLUCOPHAGE) 500 MG tablet   glipiZIDE (GLUCOTROL) 5 MG tablet   Other Relevant Orders   Hemoglobin A1c (Completed)   CBC with Differential/Platelet (Completed)   Comprehensive metabolic panel (Completed)   LP+LDL Direct (Completed)    Microalbumin/Creatinine Ratio, Urine (Completed)   HM Diabetes Foot Exam (Completed)   GERD (gastroesophageal reflux disease)    Chronic. Not well controlled on famotidine. She is experiencing frequent symptoms. We will plan to start pantoprazole today. She may continue to use famotidine as needed for breath through symptoms.       Relevant Medications   famotidine (PEPCID) 20 MG tablet   pantoprazole (PROTONIX) 40 MG tablet   Hyperlipidemia associated with type 2 diabetes mellitus (HCC)    Chronic. Lipids monitored today. No alarm symptoms present at this time. LDL Goal < 70. Controlled with  rosuvastatin .  Plan: Continue current lipid-lowering therapy. orders written for new lab studies as appropriate; see orders Recommend Treatment of diabetes , Treatment of hypertension , Weight reduction , and Strict  diet and exercise Diet and exercise recommendations provided.  Follow-up in 3months or sooner based on lab findings as appropriate.         Relevant Medications   rosuvastatin (CRESTOR) 20 MG tablet   aspirin 81 MG chewable tablet   lisinopril (ZESTRIL) 20 MG tablet   insulin glargine, 2 Unit Dial, (TOUJEO MAX SOLOSTAR) 300 UNIT/ML Solostar Pen   metFORMIN (GLUCOPHAGE) 500 MG tablet   glipiZIDE (GLUCOTROL) 5 MG tablet   Other Relevant Orders   Hemoglobin A1c (Completed)   CBC with Differential/Platelet (Completed)   Comprehensive metabolic panel (Completed)   LP+LDL Direct (Completed)   Microalbumin/Creatinine Ratio, Urine (Completed)   Pyelonephritis of right kidney - Primary    Recent pyelo symptoms have resolved. We will repeat monitoring of labs today. No alarm symptoms. Recommend at least 64 ounces of water daily to keep the kidneys flushed.       Pain of right hip    Rip hip pain from fall several months ago. No obvious injury present at this time, but tenderness is present. Given the prolonged course of healing, we will obtain x-rays for further evaluation. She has not  had evaluation or imaging completed for this.       Relevant Orders   DG Hip Unilat W OR W/O Pelvis 2-3 Views Right   Other Visit Diagnoses     Need for COVID-19 vaccine       Relevant Orders   Pfizer Comirnaty Covid -19 Vaccine 45yrs and older (Completed)   Need for influenza vaccination       Relevant Orders   Flu Vaccine Trivalent High Dose (Fluad) (Completed)   Menopausal symptoms       Relevant Medications   PARoxetine (PAXIL) 30 MG tablet   Screening mammogram for breast cancer       Screening for osteoporosis       Relevant Orders   MM 3D SCREENING MAMMOGRAM BILATERAL BREAST   DG Bone Density   Chronic low back pain with bilateral sciatica, unspecified back pain laterality       Relevant Medications   aspirin 81 MG chewable tablet   PARoxetine (PAXIL) 30 MG tablet   meloxicam (MOBIC) 7.5 MG tablet   Encounter for screening mammogram for malignant neoplasm of breast       Relevant Orders   MM 3D SCREENING MAMMOGRAM BILATERAL BREAST   Other primary ovarian failure       Relevant Orders   DG Bone Density   Fall, initial encounter       Relevant Orders   DG Hip Unilat W OR W/O Pelvis 2-3 Views Right       Tollie Eth, DNP, AGNP-c

## 2023-05-04 NOTE — Patient Instructions (Signed)
I have put the order in for imaging at [facility] on [address]. You can walk in to have the x-ray of the hip.    I put the order for mammogram and bone scan in for you. They will call to schedule.   Let me know if your sugars go too low (less than 100)

## 2023-05-05 LAB — COMPREHENSIVE METABOLIC PANEL
ALT: 22 IU/L (ref 0–32)
AST: 15 IU/L (ref 0–40)
Albumin: 4 g/dL (ref 3.9–4.9)
Alkaline Phosphatase: 133 IU/L — ABNORMAL HIGH (ref 44–121)
BUN/Creatinine Ratio: 24 (ref 12–28)
BUN: 20 mg/dL (ref 8–27)
Bilirubin Total: 0.5 mg/dL (ref 0.0–1.2)
CO2: 22 mmol/L (ref 20–29)
Calcium: 9.3 mg/dL (ref 8.7–10.3)
Chloride: 103 mmol/L (ref 96–106)
Creatinine, Ser: 0.84 mg/dL (ref 0.57–1.00)
Globulin, Total: 2 g/dL (ref 1.5–4.5)
Glucose: 215 mg/dL — ABNORMAL HIGH (ref 70–99)
Potassium: 4.7 mmol/L (ref 3.5–5.2)
Sodium: 140 mmol/L (ref 134–144)
Total Protein: 6 g/dL (ref 6.0–8.5)
eGFR: 75 mL/min/{1.73_m2} (ref >59)

## 2023-05-05 LAB — CBC WITH DIFFERENTIAL/PLATELET
Basophils Absolute: 0.1 10*3/uL (ref 0.0–0.2)
Basos: 1 %
EOS (ABSOLUTE): 0.2 10*3/uL (ref 0.0–0.4)
Eos: 3 %
Hematocrit: 45.6 % (ref 34.0–46.6)
Hemoglobin: 14.9 g/dL (ref 11.1–15.9)
Immature Grans (Abs): 0 10*3/uL (ref 0.0–0.1)
Immature Granulocytes: 0 %
Lymphocytes Absolute: 2.4 10*3/uL (ref 0.7–3.1)
Lymphs: 32 %
MCH: 30 pg (ref 26.6–33.0)
MCHC: 32.7 g/dL (ref 31.5–35.7)
MCV: 92 fL (ref 79–97)
Monocytes Absolute: 0.5 10*3/uL (ref 0.1–0.9)
Monocytes: 7 %
Neutrophils Absolute: 4.5 10*3/uL (ref 1.4–7.0)
Neutrophils: 57 %
Platelets: 326 10*3/uL (ref 150–450)
RBC: 4.96 x10E6/uL (ref 3.77–5.28)
RDW: 13.5 % (ref 11.7–15.4)
WBC: 7.7 10*3/uL (ref 3.4–10.8)

## 2023-05-05 LAB — MICROALBUMIN / CREATININE URINE RATIO
Creatinine, Urine: 83.5 mg/dL
Microalb/Creat Ratio: 20 mg/g{creat} (ref 0–29)
Microalbumin, Urine: 16.3 ug/mL

## 2023-05-05 LAB — HEMOGLOBIN A1C
Est. average glucose Bld gHb Est-mCnc: 283 mg/dL
Hgb A1c MFr Bld: 11.5 % — ABNORMAL HIGH (ref 4.8–5.6)

## 2023-05-05 LAB — LP+LDL DIRECT
Cholesterol, Total: 323 mg/dL — ABNORMAL HIGH (ref 100–199)
HDL: 34 mg/dL — ABNORMAL LOW (ref >39)
LDL Chol Calc (NIH): 182 mg/dL — ABNORMAL HIGH (ref 0–99)
LDL Direct: 176 mg/dL — ABNORMAL HIGH (ref 0–99)
Triglycerides: 523 mg/dL — ABNORMAL HIGH (ref 0–149)
VLDL Cholesterol Cal: 107 mg/dL — ABNORMAL HIGH (ref 5–40)

## 2023-05-09 ENCOUNTER — Other Ambulatory Visit: Payer: Self-pay

## 2023-05-09 MED ORDER — COMFORT EZ MICRO PEN NEEDLES 32G X 4 MM MISC: 1.0000 | Freq: Every day | 1 refills | Status: DC

## 2023-05-11 DIAGNOSIS — M25551 Pain in right hip: Secondary | ICD-10-CM

## 2023-05-11 HISTORY — DX: Pain in right hip: M25.551

## 2023-05-11 NOTE — Assessment & Plan Note (Signed)
Recent pyelo symptoms have resolved. We will repeat monitoring of labs today. No alarm symptoms. Recommend at least 64 ounces of water daily to keep the kidneys flushed.

## 2023-05-11 NOTE — Assessment & Plan Note (Signed)
Chronic. BG poorly controlled course. Associated with Hypertension, Hyperlipidemia, Chronic Kidney Disease, and Hypothyroidism. No symptoms present today. Currently managed with glipizide ann metformin. Foot Exam HM Status: was completed today. Urine Micro HM Status: was completed today. A1c HM Status: was completed today. CMP HM Status: was completed today. Lipids HM Status: was completed today. Eye Exam HM Status: is reported as completed, but records are needed. Plan: Glucose monitoring once or twice daily with goals for AM fasting blood sugar 70-110 and 2 hours after meals blood sugar <150.  Diet should include 150-180 grams of carbohydrates, 20-30 grams of protein, 30 grams of fiber, and no more than 15 grams of saturated fat per day.  Exercise goals should include an average of 150 minutes of moderate intensity activity per week.  Blood pressure monitoring weekly with goal <130/80. Continue diabetes medications. We have added Toujeo today starting at 14u at bedtime with instructions to increase based on glucose readings average.  We have completed Ozempic patient assistance forms today. She will get the needed information so we can get this sent into the company.  Labs ordered. Plan to follow-up in 3months.

## 2023-05-11 NOTE — Assessment & Plan Note (Signed)
Chronic. BP stable No alarm symptoms present at this time.  Currently managed with lisinopril. Goal blood pressure less than less than 130/80. Currently is not followed with cardiology.  Plan: current treatment plan is effective, no change in therapy, orders and follow up as documented in EpicCare.  Refills have been provided today. Labs today to check electrolytes and kidney function.  Will make changes to plan of care as necessary based on lab results.  Plan to follow-up in 3months.

## 2023-05-11 NOTE — Assessment & Plan Note (Signed)
Rip hip pain from fall several months ago. No obvious injury present at this time, but tenderness is present. Given the prolonged course of healing, we will obtain x-rays for further evaluation. She has not had evaluation or imaging completed for this.

## 2023-05-11 NOTE — Assessment & Plan Note (Signed)
Chronic. Not well controlled on famotidine. She is experiencing frequent symptoms. We will plan to start pantoprazole today. She may continue to use famotidine as needed for breath through symptoms.

## 2023-05-11 NOTE — Assessment & Plan Note (Signed)
Chronic. Lipids monitored today. No alarm symptoms present at this time. LDL Goal < 70. Controlled with  rosuvastatin .  Plan: Continue current lipid-lowering therapy. orders written for new lab studies as appropriate; see orders Recommend Treatment of diabetes , Treatment of hypertension , Weight reduction , and Strict diet and exercise Diet and exercise recommendations provided.  Follow-up in 3months or sooner based on lab findings as appropriate.

## 2023-05-25 ENCOUNTER — Other Ambulatory Visit: Payer: Self-pay | Admitting: Nurse Practitioner

## 2023-05-25 DIAGNOSIS — Z1212 Encounter for screening for malignant neoplasm of rectum: Secondary | ICD-10-CM

## 2023-05-25 DIAGNOSIS — Z1211 Encounter for screening for malignant neoplasm of colon: Secondary | ICD-10-CM

## 2023-05-31 ENCOUNTER — Telehealth: Payer: Self-pay | Admitting: Nurse Practitioner

## 2023-05-31 NOTE — Telephone Encounter (Signed)
Pt left message needs test strips for glucose monitor

## 2023-06-01 ENCOUNTER — Other Ambulatory Visit: Payer: Self-pay

## 2023-06-01 ENCOUNTER — Other Ambulatory Visit: Payer: Self-pay | Admitting: Nurse Practitioner

## 2023-06-01 MED ORDER — BLOOD GLUCOSE TEST VI STRP
1.0000 | ORAL_STRIP | Freq: Three times a day (TID) | 0 refills | Status: AC
Start: 2023-06-01 — End: 2023-07-01

## 2023-06-01 MED ORDER — LANCETS MISC. MISC
1.0000 | Freq: Three times a day (TID) | 0 refills | Status: AC
Start: 2023-06-01 — End: 2023-07-01

## 2023-06-01 MED ORDER — LANCET DEVICE MISC
1.0000 | Freq: Three times a day (TID) | 0 refills | Status: AC
Start: 2023-06-01 — End: 2023-07-01

## 2023-06-01 MED ORDER — BLOOD GLUCOSE MONITORING SUPPL DEVI: 1.0000 | Freq: Three times a day (TID) | 0 refills | Status: DC

## 2023-06-01 NOTE — Telephone Encounter (Signed)
done

## 2023-06-06 ENCOUNTER — Telehealth: Payer: Self-pay | Admitting: Nurse Practitioner

## 2023-06-06 NOTE — Telephone Encounter (Signed)
Pt came by office & completed Pt Assist form

## 2023-06-06 NOTE — Telephone Encounter (Signed)
Pt Assistance Ozempic/ Office note stating pt was going to bring in rest of information needed, I called pt & she would like income to just be verified thru [employer], form that was completed is no longer accepted. Pt will come by & complete new form

## 2023-06-08 NOTE — Telephone Encounter (Signed)
PAP faxed to [employer]

## 2023-06-13 ENCOUNTER — Telehealth: Payer: Self-pay | Admitting: Nurse Practitioner

## 2023-06-13 NOTE — Telephone Encounter (Signed)
Pt called stating she needs glucose monitor, we sent one already, called pharmacy and insurance will not pay for another states one has been dispensed in the past 5 years,  cost $30.  Checked & we do not have this meter.  Called pt and she states she can pick it up.

## 2023-06-15 ENCOUNTER — Encounter (HOSPITAL_COMMUNITY): Payer: Self-pay

## 2023-06-15 ENCOUNTER — Emergency Department (HOSPITAL_COMMUNITY): Payer: Medicare Other

## 2023-06-15 ENCOUNTER — Emergency Department (HOSPITAL_COMMUNITY)
Admission: EM | Admit: 2023-06-15 | Discharge: 2023-06-15 | Disposition: A | Discharge status: Home / Self Care | Payer: Medicare Other | Attending: Emergency Medicine | Admitting: Emergency Medicine

## 2023-06-15 ENCOUNTER — Other Ambulatory Visit: Payer: Self-pay

## 2023-06-15 DIAGNOSIS — Z794 Long term (current) use of insulin: Secondary | ICD-10-CM | POA: Insufficient documentation

## 2023-06-15 DIAGNOSIS — Z1152 Encounter for screening for COVID-19: Secondary | ICD-10-CM | POA: Diagnosis not present

## 2023-06-15 DIAGNOSIS — G43909 Migraine, unspecified, not intractable, without status migrainosus: Secondary | ICD-10-CM | POA: Diagnosis not present

## 2023-06-15 DIAGNOSIS — J40 Bronchitis, not specified as acute or chronic: Secondary | ICD-10-CM | POA: Diagnosis not present

## 2023-06-15 DIAGNOSIS — R519 Headache, unspecified: Secondary | ICD-10-CM | POA: Diagnosis present

## 2023-06-15 DIAGNOSIS — Z7982 Long term (current) use of aspirin: Secondary | ICD-10-CM | POA: Diagnosis not present

## 2023-06-15 DIAGNOSIS — I10 Essential (primary) hypertension: Secondary | ICD-10-CM | POA: Diagnosis not present

## 2023-06-15 LAB — RESP PANEL BY RT-PCR (RSV, FLU A&B, COVID)  RVPGX2
Influenza A by PCR: NEGATIVE
Influenza B by PCR: NEGATIVE
Resp Syncytial Virus by PCR: NEGATIVE
SARS Coronavirus 2 by RT PCR: NEGATIVE

## 2023-06-15 LAB — COMPREHENSIVE METABOLIC PANEL
ALT: 18 U/L (ref 0–44)
AST: 18 U/L (ref 15–41)
Albumin: 4 g/dL (ref 3.5–5.0)
Alkaline Phosphatase: 115 U/L (ref 38–126)
Anion gap: 10 (ref 5–15)
BUN: 16 mg/dL (ref 8–23)
CO2: 24 mmol/L (ref 22–32)
Calcium: 8.8 mg/dL — ABNORMAL LOW (ref 8.9–10.3)
Chloride: 101 mmol/L (ref 98–111)
Creatinine, Ser: 0.79 mg/dL (ref 0.44–1.00)
GFR, Estimated: >60 mL/min (ref >60)
Glucose, Bld: 228 mg/dL — ABNORMAL HIGH (ref 70–99)
Potassium: 4 mmol/L (ref 3.5–5.1)
Sodium: 135 mmol/L (ref 135–145)
Total Bilirubin: 0.7 mg/dL (ref 0.3–1.2)
Total Protein: 7.2 g/dL (ref 6.5–8.1)

## 2023-06-15 LAB — CBC
HCT: 45.4 % (ref 36.0–46.0)
Hemoglobin: 15.2 g/dL — ABNORMAL HIGH (ref 12.0–15.0)
MCH: 29.8 pg (ref 26.0–34.0)
MCHC: 33.5 g/dL (ref 30.0–36.0)
MCV: 89 fL (ref 80.0–100.0)
Platelets: 307 10*3/uL (ref 150–400)
RBC: 5.1 MIL/uL (ref 3.87–5.11)
RDW: 13 % (ref 11.5–15.5)
WBC: 12.9 10*3/uL — ABNORMAL HIGH (ref 4.0–10.5)
nRBC: 0 % (ref 0.0–0.2)

## 2023-06-15 LAB — CBG MONITORING, ED: Glucose-Capillary: 233 mg/dL — ABNORMAL HIGH (ref 70–99)

## 2023-06-15 MED ORDER — PROCHLORPERAZINE EDISYLATE 10 MG/2ML IJ SOLN
10.0000 mg | Freq: Once | INTRAMUSCULAR | Status: AC
  Administered 2023-06-15: 10 mg via INTRAVENOUS
  Filled 2023-06-15: qty 2

## 2023-06-15 MED ORDER — KETOROLAC TROMETHAMINE 30 MG/ML IJ SOLN
15.0000 mg | Freq: Once | INTRAMUSCULAR | Status: AC
Start: 2023-06-15 — End: 2023-06-15
  Administered 2023-06-15: 15 mg via INTRAVENOUS
  Filled 2023-06-15: qty 1

## 2023-06-15 MED ORDER — DOXYCYCLINE HYCLATE 100 MG PO TABS: 100.0000 mg | ORAL_TABLET | Freq: Two times a day (BID) | ORAL | 0 refills | Status: DC

## 2023-06-15 MED ORDER — ONDANSETRON 4 MG PO TBDP
4.0000 mg | ORAL_TABLET | Freq: Once | ORAL | Status: AC
  Administered 2023-06-15: 4 mg via ORAL
  Filled 2023-06-15: qty 1

## 2023-06-15 MED ORDER — BENZONATATE 100 MG PO CAPS: 100.0000 mg | ORAL_CAPSULE | Freq: Three times a day (TID) | ORAL | 0 refills | Status: DC

## 2023-06-15 NOTE — ED Provider Notes (Signed)
[place] EMERGENCY DEPARTMENT AT [facility] Provider Note   CSN: [id number] Arrival date & time: 06/15/23  1216     History  Chief Complaint  Patient presents with  . Headache    Kendra Miles is a 72 y.o. female.   Headache    Patient has a history of hypertension diabetes acid reflux, chronic headaches.  Patient presents with complaints of headache as well as cough.  Patient states she has had URI type symptoms the last few days.  She has had coughing as well as nasal congestion.  She is not sure if she has had any fevers at home.  She denies any vomiting or diarrhea although has had some nausea.  Patient also started having a headache this morning.  Triage note reports nausea vomiting although patient denies that to me.  She states has been nauseated but has not vomited.  Patient states she usually has more vomiting with her migraines.  She denies any recent falls or injuries.  No neck stiffness.  Home Medications Prior to Admission medications   Medication Sig Start Date End Date Taking? Authorizing Provider  benzonatate (TESSALON) 100 MG capsule Take 1 capsule (100 mg total) by mouth every 8 (eight) hours. 06/15/23  Yes Linwood Dibbles, MD  doxycycline (VIBRA-TABS) 100 MG tablet Take 1 tablet (100 mg total) by mouth 2 (two) times daily. 06/15/23  Yes Linwood Dibbles, MD  aspirin 81 MG chewable tablet Chew 1 tablet (81 mg total) by mouth daily. 05/04/23   Tollie Eth, NP  blood glucose meter kit and supplies Testing at least once per day (morning fasting), with option to test up to 4 times per day (morning fasting and as needed for symptoms) as needed for better control. Use only the strips that come in this kit with this meter- other strips will not work properly. 11/02/20   Tollie Eth, NP  Blood Glucose Monitoring Suppl (ACCU-CHEK GUIDE ME) w/Device KIT 1 EACH BY DOES NOT APPLY ROUTE IN THE MORNING, AT NOON, AND AT BEDTIME. MAY SUBSTITUTE TO ANY MANUFACTURER COVERED BY  PATIENT'S INSURANCE. 06/01/23   Early, Sung Amabile, NP  calcium carbonate (OSCAL) 1500 (600 Ca) MG TABS tablet Take 0.4 tablets (600 mg total) by mouth daily. 11/02/20   Tollie Eth, NP  famotidine (PEPCID) 20 MG tablet Take 20 mg by mouth 2 (two) times daily.    [provider]  fluticasone (FLONASE) 50 MCG/ACT nasal spray SPRAY 2 SPRAYS INTO EACH NOSTRIL EVERY DAY Patient taking differently: Place 2 sprays into both nostrils daily as needed for allergies. 12/15/21   Tollie Eth, NP  glipiZIDE (GLUCOTROL) 5 MG tablet Take 1 tablet (5 mg total) by mouth at bedtime. 05/04/23   Tollie Eth, NP  Glucose Blood (BLOOD GLUCOSE TEST STRIPS) STRP 1 each by In Vitro route in the morning, at noon, and at bedtime. May substitute to any manufacturer covered by patient's insurance. 06/01/23 07/01/23  Tollie Eth, NP  insulin glargine, 2 Unit Dial, (TOUJEO MAX SOLOSTAR) 300 UNIT/ML Solostar Pen Inject 14 Units into the skin at bedtime. Increase by 2 units every 4 days if AM fasting blood sugar is higher than 120. Goal blood sugar 100-120. 05/04/23   Early, Sung Amabile, NP  Insulin Pen Needle (COMFORT EZ MICRO PEN NEEDLES) 32G X 4 MM MISC 1 Needle by Does not apply route daily. 05/09/23   Tollie Eth, NP  Lancet Device MISC 1 each by Does not apply  route in the morning, at noon, and at bedtime. May substitute to any manufacturer covered by patient's insurance. 06/01/23 07/01/23  Tollie Eth, NP  Lancets Misc. MISC 1 each by Does not apply route in the morning, at noon, and at bedtime. May substitute to any manufacturer covered by patient's insurance. 06/01/23 07/01/23  Tollie Eth, NP  lisinopril (ZESTRIL) 20 MG tablet Take 1 tablet (20 mg total) by mouth daily. 05/04/23   Tollie Eth, NP  loratadine (CLARITIN) 10 MG tablet Take 1 tablet (10 mg total) by mouth daily. 02/04/21   Tollie Eth, NP  meloxicam (MOBIC) 7.5 MG tablet Take 1 tablet (7.5 mg total) by mouth daily. 05/04/23   Tollie Eth, NP   metFORMIN (GLUCOPHAGE) 500 MG tablet Take 1 tablet (500 mg total) by mouth daily with breakfast. 05/04/23   Early, Sung Amabile, NP  Multiple Vitamin (MULTIVITAMIN WITH MINERALS) TABS tablet Take 1 tablet by mouth daily. Women's 50+ Multivitamin    [provider]  Omega-3 Fatty Acids (FISH OIL PO) Take 2,000 mg by mouth daily.    [provider]  pantoprazole (PROTONIX) 40 MG tablet Take 1 tablet (40 mg total) by mouth 2 (two) times daily. 05/04/23   Tollie Eth, NP  PARoxetine (PAXIL) 30 MG tablet Take 1 tablet (30 mg total) by mouth daily. 05/04/23   Tollie Eth, NP  rizatriptan (MAXALT) 10 MG tablet Take 1 tablet (10 mg total) by mouth as needed for migraine. May repeat in 2 hours if needed. Do not take more than 2 doses in 24 hours. 07/20/21   Tollie Eth, NP  rosuvastatin (CRESTOR) 20 MG tablet Take 1 tablet (20 mg total) by mouth daily. 05/04/23   Tollie Eth, NP      Allergies    Ciprofloxacin, Guaifenesin er, and Penicillins    Review of Systems   Review of Systems  Neurological:  Positive for headaches.    Physical Exam Updated Vital Signs BP (!) 169/90 (BP Location: Right Arm)   Pulse 96   Temp 99 F (37.2 C) (Oral)   Resp 15   Ht 1.676 m (5\' 6" )   Wt 85.7 kg   SpO2 92%   BMI 30.51 kg/m  Physical Exam Vitals and nursing note reviewed.  Constitutional:      Appearance: She is well-developed. She is not diaphoretic.  HENT:     Head: Normocephalic and atraumatic.     Right Ear: External ear normal.     Left Ear: External ear normal.  Eyes:     General: No scleral icterus.       Right eye: No discharge.        Left eye: No discharge.     Conjunctiva/sclera: Conjunctivae normal.  Neck:     Trachea: No tracheal deviation.  Cardiovascular:     Rate and Rhythm: Normal rate and regular rhythm.  Pulmonary:     Effort: Pulmonary effort is normal. No respiratory distress.     Breath sounds: Normal breath sounds. No stridor. No wheezing or rales.   Abdominal:     General: Bowel sounds are normal. There is no distension.     Palpations: Abdomen is soft.     Tenderness: There is no abdominal tenderness. There is no guarding or rebound.  Musculoskeletal:        General: No tenderness or deformity.     Cervical back: Normal range of motion and neck supple.  Skin:  General: Skin is warm and dry.     Findings: No rash.  Neurological:     General: No focal deficit present.     Mental Status: She is alert.     Cranial Nerves: No cranial nerve deficit, dysarthria or facial asymmetry.     Sensory: No sensory deficit.     Motor: No abnormal muscle tone or seizure activity.     Coordination: Coordination normal.  Psychiatric:        Mood and Affect: Mood normal.     ED Results / Procedures / Treatments   Labs (all labs ordered are listed, but only abnormal results are displayed) Labs Reviewed  CBC - Abnormal; Notable for the following components:      Result Value   WBC 12.9 (*)    Hemoglobin 15.2 (*)    All other components within normal limits  COMPREHENSIVE METABOLIC PANEL - Abnormal; Notable for the following components:   Glucose, Bld 228 (*)    Calcium 8.8 (*)    All other components within normal limits  CBG MONITORING, ED - Abnormal; Notable for the following components:   Glucose-Capillary 233 (*)    All other components within normal limits  RESP PANEL BY RT-PCR (RSV, FLU A&B, COVID)  [id number]    EKG None  Radiology DG Chest Port 1 View  Result Date: 06/15/2023 CLINICAL DATA:  Cough and hypoxia EXAM: PORTABLE CHEST 1 VIEW COMPARISON:  Chest radiograph 06/10/2021 FINDINGS: The cardiomediastinal silhouette is stable. Retrocardiac opacity is consistent with known moderate-sized hiatal hernia. There is no focal airspace consolidation or pulmonary edema. There is no significant pleural effusion. There is no pneumothorax There is no acute osseous abnormality. IMPRESSION: 1. No radiographic evidence of acute  cardiopulmonary process. 2. Moderate-sized hiatal hernia. Electronically Signed   By: Lesia Hausen M.D.   On: 06/15/2023 15:13    Procedures Procedures    Medications Ordered in ED Medications  ondansetron (ZOFRAN-ODT) disintegrating tablet 4 mg (4 mg Oral Given 06/15/23 1238)  prochlorperazine (COMPAZINE) injection 10 mg (10 mg Intravenous Given 06/15/23 1414)  ketorolac (TORADOL) 30 MG/ML injection 15 mg (15 mg Intravenous Given 06/15/23 1414)    ED Course/ Medical Decision Making/ A&P Clinical Course as of 06/15/23 1611  Fri Jun 15, 2023  1457 COVID flu RSV negative.  Metabolic panel notable for hyperglycemia.  Leukocytosis noted white blood cell count [JK]  1608 Patient able to ambulate around the ED without oxygen.  Sats remained above 90% [JK]    Clinical Course User Index [JK] Linwood Dibbles, MD                                 Medical Decision Making Problems Addressed: Bronchitis: acute illness or injury that poses a threat to life or bodily functions Migraine without status migrainosus, not intractable, unspecified migraine type: acute illness or injury that poses a threat to life or bodily functions  Amount and/or Complexity of Data Reviewed Labs: ordered. Decision-making details documented in ED Course. Radiology: ordered and independent interpretation performed.  Risk Prescription drug management.   Patient presented to ED with complaints of URI type symptoms.  Patient has been having trouble with cough and congestion.  ED workup without signs of pneumonia on x-ray.  No evidence of COVID flu or RSV.  Possible symptoms may be viral in nature.  Bacterial bronchitis is also a concern although less likely.  Patient had a headache earlier.  History of migraines.  Patient symptoms improved with migraine cocktail.  Findings not suggestive of meningitis or other serious etiology.  Will discharge home with medications for her cough.  Discussed antibiotic prescription use.   Will have patient hold off on taking the prescription but if she is not feeling better over the weekend she can start that prescription and follow-up with her doctor at this coming week.    Warning signs precautions discussed         Final Clinical Impression(s) / ED Diagnoses Final diagnoses:  Migraine without status migrainosus, not intractable, unspecified migraine type  Bronchitis    Rx / DC Orders ED Discharge Orders          Ordered    doxycycline (VIBRA-TABS) 100 MG tablet  2 times daily        06/15/23 1607    benzonatate (TESSALON) 100 MG capsule  Every 8 hours        06/15/23 1607              Linwood Dibbles, MD 06/15/23 1611

## 2023-06-15 NOTE — Discharge Instructions (Signed)
Take the medications to help with your cough.  Start taking antibiotics if you are not feeling better in the next day or so.  Follow-up with your doctor next week to be rechecked.  Return as needed for worsening symptoms

## 2023-06-15 NOTE — ED Triage Notes (Signed)
Patient reports cough x several days. Started having a headache this AM followed by nausea and vomiting. Patient has hx of migraine.

## 2023-06-26 ENCOUNTER — Other Ambulatory Visit: Payer: Self-pay | Admitting: Nurse Practitioner

## 2023-06-26 DIAGNOSIS — E1159 Type 2 diabetes mellitus with other circulatory complications: Secondary | ICD-10-CM

## 2023-07-03 ENCOUNTER — Ambulatory Visit (INDEPENDENT_AMBULATORY_CARE_PROVIDER_SITE_OTHER): Payer: Medicare Other | Admitting: Medical

## 2023-07-03 ENCOUNTER — Encounter: Payer: Self-pay | Admitting: Medical

## 2023-07-03 VITALS — BP 134/84 | HR 82 | Temp 98.3°F | Ht 66.0 in | Wt 190.4 lb

## 2023-07-03 DIAGNOSIS — R058 Other specified cough: Secondary | ICD-10-CM | POA: Diagnosis not present

## 2023-07-03 DIAGNOSIS — R0602 Shortness of breath: Secondary | ICD-10-CM

## 2023-07-03 DIAGNOSIS — J988 Other specified respiratory disorders: Secondary | ICD-10-CM

## 2023-07-03 MED ORDER — HYDROCODONE BIT-HOMATROP MBR 5-1.5 MG/5ML PO SOLN
5.0000 mL | Freq: Three times a day (TID) | ORAL | 0 refills | Status: AC | PRN
Start: 2023-07-03 — End: 2023-07-08

## 2023-07-03 MED ORDER — AZITHROMYCIN 250 MG PO TABS
ORAL_TABLET | ORAL | 0 refills | Status: DC
Start: 2023-07-03 — End: 2023-07-10

## 2023-07-03 MED ORDER — ALBUTEROL SULFATE HFA 108 (90 BASE) MCG/ACT IN AERS
2.0000 | INHALATION_SPRAY | Freq: Four times a day (QID) | RESPIRATORY_TRACT | 0 refills | Status: DC | PRN
Start: 2023-07-03 — End: 2023-07-18

## 2023-07-03 NOTE — Progress Notes (Signed)
Subjective:  Kendra Miles is a 72 y.o. female who presents with cough.  Went to emergency dept 3 weeks ago, diagnosed with bronchitis.  Had been taking doxycycline and tessalon Perles.  Finished all of that. Not over the bronchitis.    Currently has a lot of cough, feels congestion.  Has some SOB.  Currently no sore throat, no ear pain, no sinus pain, no fever.  No chills. Has some body aches.  No NVD.   Nonsmoker.   No hx/o asthma or lung disease. No other aggravating or relieving factors.  No other c/o.  The following portions of the patient's history were reviewed and updated as appropriate: allergies, current medications, past family history, past medical history, past social history, past surgical history and problem list.  ROS as in subjective  Past Medical History:  Diagnosis Date   Acid reflux    Arthritis    Back pain 11/02/2020   Calcium deficiency 11/02/2020   Cholelithiasis with choledocholithiasis 02/2019   Diabetes (HCC)    Emesis, persistent 06/10/2021   Encounter to establish care with new doctor 11/02/2020   Fall 05/06/2021   Flatulence, eructation and gas pain 11/03/2020   Headache    Hiatal hernia    Hyperlipidemia    Lower extremity edema 11/02/2020   Menopausal symptoms 11/02/2020   Otitis media 08/30/2021   Pneumonia    Polyp of stomach and duodenum    Upper GI bleed 02/03/2021   Viral URI 08/23/2021   Current Outpatient Medications on File Prior to Visit  Medication Sig Dispense Refill   aspirin 81 MG chewable tablet Chew 1 tablet (81 mg total) by mouth daily. 90 tablet 1   blood glucose meter kit and supplies Testing at least once per day (morning fasting), with option to test up to 4 times per day (morning fasting and as needed for symptoms) as needed for better control. Use only the strips that come in this kit with this meter- other strips will not work properly. 1 each 99   Blood Glucose Monitoring Suppl (ACCU-CHEK GUIDE ME) w/Device KIT 1 EACH BY DOES  NOT APPLY ROUTE IN THE MORNING, AT NOON, AND AT BEDTIME. MAY SUBSTITUTE TO ANY MANUFACTURER COVERED BY PATIENT'S INSURANCE. 1 kit 0   calcium carbonate (OSCAL) 1500 (600 Ca) MG TABS tablet Take 0.4 tablets (600 mg total) by mouth daily. 90 tablet 1   glipiZIDE (GLUCOTROL) 5 MG tablet Take 1 tablet (5 mg total) by mouth at bedtime. 90 tablet 3   insulin glargine, 2 Unit Dial, (TOUJEO MAX SOLOSTAR) 300 UNIT/ML Solostar Pen Inject 14 Units into the skin at bedtime. Increase by 2 units every 4 days if AM fasting blood sugar is higher than 120. Goal blood sugar 100-120.     Insulin Pen Needle (COMFORT EZ MICRO PEN NEEDLES) 32G X 4 MM MISC 1 Needle by Does not apply route daily. 100 each 1   lisinopril (ZESTRIL) 20 MG tablet TAKE 1 TABLET BY MOUTH EVERY DAY 90 tablet 1   loratadine (CLARITIN) 10 MG tablet Take 1 tablet (10 mg total) by mouth daily. 90 tablet 3   meloxicam (MOBIC) 7.5 MG tablet Take 1 tablet (7.5 mg total) by mouth daily. 30 tablet 6   metFORMIN (GLUCOPHAGE) 500 MG tablet Take 1 tablet (500 mg total) by mouth daily with breakfast. 90 tablet 3   Multiple Vitamin (MULTIVITAMIN WITH MINERALS) TABS tablet Take 1 tablet by mouth daily. Women's 50+ Multivitamin     Omega-3 Fatty Acids (  FISH OIL PO) Take 2,000 mg by mouth daily.     pantoprazole (PROTONIX) 40 MG tablet Take 1 tablet (40 mg total) by mouth 2 (two) times daily. 180 tablet 3   PARoxetine (PAXIL) 30 MG tablet Take 1 tablet (30 mg total) by mouth daily. 90 tablet 3   rosuvastatin (CRESTOR) 20 MG tablet Take 1 tablet (20 mg total) by mouth daily. 7 tablet 0   benzonatate (TESSALON) 100 MG capsule Take 1 capsule (100 mg total) by mouth every 8 (eight) hours. (Patient not taking: Reported on 07/03/2023) 21 capsule 0   doxycycline (VIBRA-TABS) 100 MG tablet Take 1 tablet (100 mg total) by mouth 2 (two) times daily. (Patient not taking: Reported on 07/03/2023) 14 tablet 0   fluticasone (FLONASE) 50 MCG/ACT nasal spray SPRAY 2 SPRAYS INTO  EACH NOSTRIL EVERY DAY (Patient not taking: Reported on 07/03/2023) 48 mL 0   rizatriptan (MAXALT) 10 MG tablet Take 1 tablet (10 mg total) by mouth as needed for migraine. May repeat in 2 hours if needed. Do not take more than 2 doses in 24 hours. (Patient not taking: Reported on 07/03/2023) 6 tablet 3   No current facility-administered medications on file prior to visit.     Objective: BP 134/84   Pulse 82   Temp 98.3 F (36.8 C) (Oral)   Ht 5\' 6"  (1.676 m)   Wt 190 lb 6.4 oz (86.4 kg)   SpO2 94%   BMI 30.73 kg/m   General appearance: Alert, WD/WN, no distress                             Skin: warm, no rash, no diaphoresis                           Head: no sinus tenderness                            Eyes: conjunctiva normal, corneas clear, PERRLA                            Ears: flat TMs, external ear canals normal                          Nose: septum midline, turbinates swollen, without erythema and no discharge             Mouth/throat: MMM, tongue normal, mild pharyngeal erythema                           Neck: supple, no adenopathy, no thyromegaly, nontender                          Heart: RRR, normal S1, S2, no murmurs                         Lungs: +bronchial breath sounds, +scattered rhonchi, decreased sounds in lower fields, no wheezes, no rales                Extremities: no edema, nontender      Assessment: Encounter Diagnoses  Name Primary?   Respiratory tract infection Yes   Cough productive of purulent sputum    SOB (shortness of breath)  Plan:  We discussed her lung findings and oxygen level.  Increase water intake, rest, begin albuterol inhaler trial.  Begin Z-Pak.  Can use Hycodan cough syrup, discussed risk and benefits and proper use of medication.  Call/return in 2-3 days if symptoms are worse or not improving.    [name] was seen today for cough.  Diagnoses and all orders for this visit:  Respiratory tract infection  Cough productive of  purulent sputum  SOB (shortness of breath)  Other orders -     azithromycin (ZITHROMAX) 250 MG tablet; 2 tablets day 1, then 1 tablet days 2-4 -     HYDROcodone bit-homatropine (HYCODAN) 5-1.5 MG/5ML syrup; Take 5 mLs by mouth every 8 (eight) hours as needed for up to 5 days for cough. -     albuterol (VENTOLIN HFA) 108 (90 Base) MCG/ACT inhaler; Inhale 2 puffs into the lungs every 6 (six) hours as needed for wheezing or shortness of breath.    F/u prn

## 2023-07-05 ENCOUNTER — Ambulatory Visit
Admission: RE | Admit: 2023-07-05 | Discharge: 2023-07-05 | Disposition: A | Discharge status: Home / Self Care | Payer: Medicare Other | Source: Ambulatory Visit | Attending: Nurse Practitioner | Admitting: Nurse Practitioner

## 2023-07-05 DIAGNOSIS — Z1231 Encounter for screening mammogram for malignant neoplasm of breast: Secondary | ICD-10-CM

## 2023-07-05 DIAGNOSIS — Z13820 Encounter for screening for osteoporosis: Secondary | ICD-10-CM

## 2023-07-09 ENCOUNTER — Telehealth: Payer: Self-pay | Admitting: Nurse Practitioner

## 2023-07-09 NOTE — Telephone Encounter (Signed)
Pt states that she only uses the inhaler at bedtime  The cough has gotten better with the cough medication. She is just having issues with congestion. She doesn't feel the antbiotic is working

## 2023-07-09 NOTE — Telephone Encounter (Signed)
Pt seen you last week for a cough and she says it hasn't gotten better, so she was wondering if there is anything else she can try.

## 2023-07-10 ENCOUNTER — Other Ambulatory Visit: Payer: Self-pay | Admitting: Medical

## 2023-07-10 MED ORDER — CEFUROXIME AXETIL 500 MG PO TABS: 500.0000 mg | ORAL_TABLET | Freq: Two times a day (BID) | ORAL | 0 refills | Status: DC

## 2023-07-10 NOTE — Telephone Encounter (Signed)
Left message for pt to call back  °

## 2023-07-11 NOTE — Telephone Encounter (Signed)
Left message for pt to call me back 

## 2023-07-13 NOTE — Telephone Encounter (Signed)
Left message for pt to call back. This is my 3rd attempt.

## 2023-07-18 ENCOUNTER — Other Ambulatory Visit: Payer: Self-pay | Admitting: Medical

## 2023-08-06 ENCOUNTER — Encounter: Payer: Self-pay | Admitting: Nurse Practitioner

## 2023-08-06 ENCOUNTER — Ambulatory Visit (INDEPENDENT_AMBULATORY_CARE_PROVIDER_SITE_OTHER): Payer: Medicare Other | Admitting: Nurse Practitioner

## 2023-08-06 VITALS — BP 126/84 | HR 81 | Wt 193.0 lb

## 2023-08-06 DIAGNOSIS — E1169 Type 2 diabetes mellitus with other specified complication: Secondary | ICD-10-CM

## 2023-08-06 DIAGNOSIS — E559 Vitamin D deficiency, unspecified: Secondary | ICD-10-CM

## 2023-08-06 DIAGNOSIS — R7989 Other specified abnormal findings of blood chemistry: Secondary | ICD-10-CM

## 2023-08-06 DIAGNOSIS — K219 Gastro-esophageal reflux disease without esophagitis: Secondary | ICD-10-CM | POA: Diagnosis not present

## 2023-08-06 DIAGNOSIS — Z794 Long term (current) use of insulin: Secondary | ICD-10-CM

## 2023-08-06 DIAGNOSIS — E785 Hyperlipidemia, unspecified: Secondary | ICD-10-CM

## 2023-08-06 DIAGNOSIS — E1159 Type 2 diabetes mellitus with other circulatory complications: Secondary | ICD-10-CM

## 2023-08-06 DIAGNOSIS — I152 Hypertension secondary to endocrine disorders: Secondary | ICD-10-CM

## 2023-08-06 DIAGNOSIS — R519 Headache, unspecified: Secondary | ICD-10-CM

## 2023-08-06 LAB — LIPID PANEL

## 2023-08-06 MED ORDER — ROSUVASTATIN CALCIUM 20 MG PO TABS: 20.0000 mg | ORAL_TABLET | Freq: Every day | ORAL | 3 refills | Status: DC

## 2023-08-06 MED ORDER — GLIPIZIDE 5 MG PO TABS: 10.0000 mg | ORAL_TABLET | Freq: Every day | ORAL | 3 refills | Status: DC

## 2023-08-06 NOTE — Progress Notes (Signed)
Kendra Clamp, DNP, AGNP-c [facility]  [address] [place], [place] [address] [phone]  ESTABLISHED PATIENT- Chronic Health and/or Follow-Up Visit  Blood pressure 126/84, pulse 81, weight 193 lb (87.5 kg).    Kendra Miles is a 72 y.o. year old female presenting today for evaluation and management of chronic conditions.   178, 173, 185, 235- on average 150-200 in the morning when fasting 4 boxes of toujeo samples given today.  [name] presents for chronic medication management with concerns about elevated fasting blood sugars, ranging between 150 and 200. Despite increasing the insulin pen to 20 units, the patient has not achieved the desired glucose control. She also reports recent onset of gastroesophageal reflux symptoms, described as 'burning' and 'backwash,' despite being on twice-daily pantoprazole. These symptoms have been persistent throughout the day for the past month, and are not adequately relieved by over-the-counter antacids. The patient has identified certain foods, such as dark green lettuce and cucumbers, as potential triggers.  In addition to the above, the patient has been experiencing increased frequency of headaches, occurring at least once daily for the past month. The headaches are described as 'halo-like,' and the patient has been managing them with daily ibuprofen. The patient also reported an isolated incident of tingling and numbness in the fingertips while driving, which has occurred sporadically in the past.  The patient's medication regimen includes glipizide at bedtime and metformin in the morning for diabetes, and rosuvastatin. The patient has a history of cataract surgery about a year and a half ago, and a hiatal hernia repair approximately three years ago. The patient also reports a history of gallbladder and appendix removal in 2013. The patient has recently reduced work hours to part-time upon turning 72, which has reportedly  improved overall well-being and blood sugar control.  All ROS negative with exception of what is listed above.   PHYSICAL EXAM Physical Exam Vitals and nursing note reviewed.  Constitutional:      General: She is not in acute distress.    Appearance: Normal appearance. She is not ill-appearing.  HENT:     Head: Normocephalic.  Eyes:     Conjunctiva/sclera: Conjunctivae normal.  Neck:     Vascular: No carotid bruit.  Cardiovascular:     Rate and Rhythm: Normal rate and regular rhythm.     Pulses: Normal pulses.     Heart sounds: Normal heart sounds.  Pulmonary:     Effort: Pulmonary effort is normal.     Breath sounds: Normal breath sounds.  Abdominal:     General: Bowel sounds are normal. There is no distension.     Palpations: Abdomen is soft.     Tenderness: There is no abdominal tenderness.  Musculoskeletal:     Right lower leg: No edema.     Left lower leg: No edema.  Skin:    General: Skin is warm and dry.     Capillary Refill: Capillary refill takes less than 2 seconds.  Neurological:     Mental Status: She is alert and oriented to person, place, and time.  Psychiatric:        Mood and Affect: Mood normal.      PLAN Problem List Items Addressed This Visit     Hypertension associated with diabetes (HCC)   HTN in setting of DM, HLD, and BMI >26. Medication management in place. Recommend strict diet and exercise to maximize effectiveness for risk reduction. Labs pending.       Relevant Medications  glipiZIDE (GLUCOTROL) 5 MG tablet   rosuvastatin (CRESTOR) 20 MG tablet   metoprolol succinate (TOPROL-XL) 25 MG 24 hr tablet   Other Relevant Orders   CMP14+EGFR (Completed)   Hemoglobin A1c (Completed)   Lipid panel (Completed)   CBC with Differential/Platelet (Completed)   Type 2 diabetes mellitus (HCC) - Primary   Suboptimal blood glucose control with fasting blood sugars ranging between 150-235 mg/dL. Currently on Toujeo 20 units, metformin 500mg , and  glipizide 5 mg at bedtime. Insurance issues/cost have prevented the use of Ozempic. Discussed increasing glipizide to 10 mg at bedtime to achieve better control without increasing insulin dosage. Explained that glipizide is usually inexpensive and covered by insurance. - Increase glipizide to 10 mg at bedtime - Continue Toujeo 20 units - Provide samples of Toujeo as this is not covered well on insurance. - Monitor blood glucose levels and report if running low on Toujeo/glipizide      Relevant Medications   glipiZIDE (GLUCOTROL) 5 MG tablet   rosuvastatin (CRESTOR) 20 MG tablet   Other Relevant Orders   CMP14+EGFR (Completed)   Hemoglobin A1c (Completed)   Lipid panel (Completed)   CBC with Differential/Platelet (Completed)   GERD (gastroesophageal reflux disease)   Persistent reflux symptoms despite pantoprazole 40 mg twice daily. Symptoms include burning sensation and occasional vomiting. Possible food triggers include dark green lettuce, cucumbers, and milk. History of hiatal hernia repair. Discussed potential need for gastroenterologist evaluation. She is taking ibuprofen daily, which I feel is most likely the culprit. Explained that ibuprofen may exacerbate GERD symptoms and we need to stop this. - Stop ibuprofen - Consider alternative medications to ibuprofen- I will send in metoprolol for daily prevention. Consider tylenol for headache, avoid using daily. - Continue pantoprazole 40 mg twice daily       Relevant Orders   CMP14+EGFR (Completed)   CBC with Differential/Platelet (Completed)   Hyperlipidemia associated with type 2 diabetes mellitus (HCC)   HLD in setting of DM, HTN, and BMI >26. Medication management in place. Recommend strict diet and exercise to maximize effectiveness for risk reduction. Labs pending.       Relevant Medications   glipiZIDE (GLUCOTROL) 5 MG tablet   rosuvastatin (CRESTOR) 20 MG tablet   metoprolol succinate (TOPROL-XL) 25 MG 24 hr tablet   Other  Relevant Orders   Lipid panel (Completed)   Vitamin D deficiency   Repeat labs today      Relevant Orders   VITAMIN D 25 Hydroxy (Vit-D Deficiency, Fractures) (Completed)   Elevated LFTs   Repeat CMP today      Relevant Orders   CMP14+EGFR (Completed)   Other Visit Diagnoses       Daily headache       Relevant Medications   metoprolol succinate (TOPROL-XL) 25 MG 24 hr tablet       Return in about 4 months (around 12/05/2023) for Med Management 30- DM, HTN, HLD, GERD, AWV after the first of the year.  Kendra Clamp, DNP, AGNP-c

## 2023-08-06 NOTE — Patient Instructions (Addendum)
I want to do a little research to see if there is something other than Ibuprofen that would also be inexpensive to help with the daily headaches. We need to get you off of the daily ibuprofen because I do feel that this is causing your stomach upset symptoms and this will likely only worsen with time. I will send something in to help with this.  I would like you to increase the glipizide to 10mg  once a day. I have sent in the new prescription.  Continue to increase the Toujeo as directed to get your fasting blood sugar less than 120 in the mornings.   If you need me to send new medications to the [facility] on [address] after the first of the year, let me know.

## 2023-08-07 ENCOUNTER — Encounter: Payer: Self-pay | Admitting: Nurse Practitioner

## 2023-08-07 LAB — LIPID PANEL
Cholesterol, Total: 271 mg/dL — ABNORMAL HIGH (ref 100–199)
HDL: 38 mg/dL — ABNORMAL LOW (ref >39)
LDL CALC COMMENT:: 7.1 ratio — ABNORMAL HIGH (ref 0.0–4.4)
LDL Chol Calc (NIH): 185 mg/dL — ABNORMAL HIGH (ref 0–99)
Triglycerides: 247 mg/dL — ABNORMAL HIGH (ref 0–149)
VLDL Cholesterol Cal: 48 mg/dL — ABNORMAL HIGH (ref 5–40)

## 2023-08-07 LAB — CMP14+EGFR
ALT: 14 IU/L (ref 0–32)
AST: 13 IU/L (ref 0–40)
Albumin: 3.9 g/dL (ref 3.9–4.9)
Alkaline Phosphatase: 114 IU/L (ref 44–121)
BUN/Creatinine Ratio: 17 (ref 12–28)
BUN: 15 mg/dL (ref 8–27)
Bilirubin Total: 0.5 mg/dL (ref 0.0–1.2)
CO2: 25 mmol/L (ref 20–29)
Calcium: 9.2 mg/dL (ref 8.7–10.3)
Chloride: 105 mmol/L (ref 96–106)
Creatinine, Ser: 0.88 mg/dL (ref 0.57–1.00)
Globulin, Total: 2.2 g/dL (ref 1.5–4.5)
Glucose: 136 mg/dL — ABNORMAL HIGH (ref 70–99)
Potassium: 4.7 mmol/L (ref 3.5–5.2)
Sodium: 144 mmol/L (ref 134–144)
Total Protein: 6.1 g/dL (ref 6.0–8.5)
eGFR: 71 mL/min/{1.73_m2} (ref >59)

## 2023-08-07 LAB — CBC WITH DIFFERENTIAL/PLATELET
Basophils Absolute: 0 10*3/uL (ref 0.0–0.2)
Basos: 1 %
EOS (ABSOLUTE): 0.2 10*3/uL (ref 0.0–0.4)
Eos: 4 %
Hematocrit: 45.9 % (ref 34.0–46.6)
Hemoglobin: 14.9 g/dL (ref 11.1–15.9)
Immature Grans (Abs): 0 10*3/uL (ref 0.0–0.1)
Immature Granulocytes: 0 %
Lymphocytes Absolute: 1.9 10*3/uL (ref 0.7–3.1)
Lymphs: 29 %
MCH: 28.4 pg (ref 26.6–33.0)
MCHC: 32.5 g/dL (ref 31.5–35.7)
MCV: 88 fL (ref 79–97)
Monocytes Absolute: 0.5 10*3/uL (ref 0.1–0.9)
Monocytes: 7 %
Neutrophils Absolute: 3.9 10*3/uL (ref 1.4–7.0)
Neutrophils: 59 %
Platelets: 329 10*3/uL (ref 150–450)
RBC: 5.24 x10E6/uL (ref 3.77–5.28)
RDW: 12.3 % (ref 11.7–15.4)
WBC: 6.5 10*3/uL (ref 3.4–10.8)

## 2023-08-07 LAB — HEMOGLOBIN A1C
Est. average glucose Bld gHb Est-mCnc: 212 mg/dL
Hgb A1c MFr Bld: 9 % — ABNORMAL HIGH (ref 4.8–5.6)

## 2023-08-07 LAB — VITAMIN D 25 HYDROXY (VIT D DEFICIENCY, FRACTURES): Vit D, 25-Hydroxy: 27.4 ng/mL — ABNORMAL LOW (ref 30.0–100.0)

## 2023-08-07 MED ORDER — METOPROLOL SUCCINATE ER 25 MG PO TB24: 25.0000 mg | ORAL_TABLET | Freq: Every day | ORAL | 3 refills | Status: DC

## 2023-08-07 NOTE — Assessment & Plan Note (Signed)
Repeat CMP today 

## 2023-08-07 NOTE — Assessment & Plan Note (Signed)
Persistent reflux symptoms despite pantoprazole 40 mg twice daily. Symptoms include burning sensation and occasional vomiting. Possible food triggers include dark green lettuce, cucumbers, and milk. History of hiatal hernia repair. Discussed potential need for gastroenterologist evaluation. She is taking ibuprofen daily, which I feel is most likely the culprit. Explained that ibuprofen may exacerbate GERD symptoms and we need to stop this. - Stop ibuprofen - Consider alternative medications to ibuprofen- I will send in metoprolol for daily prevention. Consider tylenol for headache, avoid using daily. - Continue pantoprazole 40 mg twice daily

## 2023-08-07 NOTE — Assessment & Plan Note (Signed)
Suboptimal blood glucose control with fasting blood sugars ranging between 150-235 mg/dL. Currently on Toujeo 20 units, metformin 500mg , and glipizide 5 mg at bedtime. Insurance issues/cost have prevented the use of Ozempic. Discussed increasing glipizide to 10 mg at bedtime to achieve better control without increasing insulin dosage. Explained that glipizide is usually inexpensive and covered by insurance. - Increase glipizide to 10 mg at bedtime - Continue Toujeo 20 units - Provide samples of Toujeo as this is not covered well on insurance. - Monitor blood glucose levels and report if running low on Toujeo/glipizide

## 2023-08-07 NOTE — Assessment & Plan Note (Signed)
HTN in setting of DM, HLD, and BMI >26. Medication management in place. Recommend strict diet and exercise to maximize effectiveness for risk reduction. Labs pending.

## 2023-08-07 NOTE — Assessment & Plan Note (Signed)
HLD in setting of DM, HTN, and BMI >26. Medication management in place. Recommend strict diet and exercise to maximize effectiveness for risk reduction. Labs pending.

## 2023-08-07 NOTE — Assessment & Plan Note (Signed)
Repeat labs today

## 2023-08-16 MED ORDER — VITAMIN D (ERGOCALCIFEROL) 1.25 MG (50000 UNIT) PO CAPS: 50000.0000 [IU] | ORAL_CAPSULE | ORAL | 3 refills | Status: DC

## 2023-08-16 NOTE — Addendum Note (Signed)
Addended by: Jareth Pardee, Hunt[place] Dec E on: 08/16/2023 08:19 AM   Modules accepted: Orders

## 2023-08-29 ENCOUNTER — Other Ambulatory Visit

## 2023-09-14 ENCOUNTER — Telehealth: Payer: Self-pay | Admitting: Nurse Practitioner

## 2023-09-14 NOTE — Telephone Encounter (Signed)
PAP OZEMPIC 2025 approved per Marylene Land

## 2023-09-18 NOTE — Progress Notes (Unsigned)
No chief complaint on file.    Last ABX in November. Diabetic, poorly controlled.  PAP for Ozempic recently approved (glipizide dose had been increased at visit in December) Lab Results  Component Value Date   HGBA1C 9.0 (H) 08/06/2023     PMH, PSH, SH reviewed   ROS:    PHYSICAL EXAM:  There were no vitals taken for this visit.      ASSESSMENT/PLAN:

## 2023-09-19 ENCOUNTER — Encounter: Payer: Self-pay | Admitting: Family Medicine

## 2023-09-19 ENCOUNTER — Ambulatory Visit (INDEPENDENT_AMBULATORY_CARE_PROVIDER_SITE_OTHER): Payer: Medicare Other | Admitting: Family Medicine

## 2023-09-19 VITALS — BP 108/68 | HR 60 | Ht 66.0 in | Wt 190.2 lb

## 2023-09-19 DIAGNOSIS — N898 Other specified noninflammatory disorders of vagina: Secondary | ICD-10-CM | POA: Diagnosis not present

## 2023-09-19 DIAGNOSIS — L299 Pruritus, unspecified: Secondary | ICD-10-CM

## 2023-09-19 DIAGNOSIS — Z794 Long term (current) use of insulin: Secondary | ICD-10-CM | POA: Diagnosis not present

## 2023-09-19 DIAGNOSIS — E1169 Type 2 diabetes mellitus with other specified complication: Secondary | ICD-10-CM

## 2023-09-19 NOTE — Patient Instructions (Signed)
You can use over-the-counter hydrocortisone to behind your ears, 2-3 times/day if needed for itching.  I didn't see any rash or reason for the itching on exam.  Your vaginal itching might be related to a fungal infection (related to diabetes not being well controlled). Atrophic vaginitis (thinning of the skin related to being postmenopausal) might also be contributing. I recommend using a topical antifungal cream (you can use a monistat externally, or the vagisil for yeast). If you aren't improving, you should see your gynecologist for further evaluation.

## 2023-11-13 NOTE — Telephone Encounter (Signed)
 PAP Ozempic received today, called pt, no voicemail option, sent mychart msg

## 2023-11-21 ENCOUNTER — Encounter: Payer: Self-pay | Admitting: Nurse Practitioner

## 2023-11-24 ENCOUNTER — Other Ambulatory Visit: Payer: Self-pay | Admitting: Nurse Practitioner

## 2023-11-26 NOTE — Telephone Encounter (Signed)
 Last apt 08/06/23.

## 2023-11-29 ENCOUNTER — Ambulatory Visit (INDEPENDENT_AMBULATORY_CARE_PROVIDER_SITE_OTHER): Admitting: Medical

## 2023-11-29 VITALS — BP 128/82 | HR 90 | Wt 193.6 lb

## 2023-11-29 DIAGNOSIS — J988 Other specified respiratory disorders: Secondary | ICD-10-CM

## 2023-11-29 DIAGNOSIS — R059 Cough, unspecified: Secondary | ICD-10-CM

## 2023-11-29 DIAGNOSIS — J3489 Other specified disorders of nose and nasal sinuses: Secondary | ICD-10-CM

## 2023-11-29 MED ORDER — HYDROCODONE BIT-HOMATROP MBR 5-1.5 MG/5ML PO SOLN: 5.0000 mL | Freq: Three times a day (TID) | ORAL | 0 refills | Status: AC | PRN

## 2023-11-29 MED ORDER — AZITHROMYCIN 250 MG PO TABS
ORAL_TABLET | ORAL | 0 refills | Status: DC
Start: 2023-11-29 — End: 2024-04-08

## 2023-11-29 NOTE — Progress Notes (Signed)
 Subjective:  Kendra Miles is a 73 y.o. female who presents for Chief Complaint  Patient presents with   other    Started feeling last Monday, cough, never had a fever, stuffy nose, sleeping all the time, tired fatigued, ST from coughing, taking Mucinex son and grandson both had covid she tested at least five times last week and it was negative.      Here for 1.5 week of symptoms. She notes cough, stuffy nose, tired, sore throat from cough.  No fever.  No body aches or chills.   No NVD.  No SOB or wheezing.  Was around grandson who had covid recently but despite multiple tests she was negative.  Using mucinex.  Blowing out and coughing up colored green mucous.  No other aggravating or relieving factors.    No other c/o.  Past Medical History:  Diagnosis Date   Acid reflux    Arthritis    Back pain 11/02/2020   Calcium deficiency 11/02/2020   Choledocholithiasis with obstruction 03/06/2019   Cholelithiasis with choledocholithiasis 02/2019   Contusion of face 02/17/2022   Diabetes (HCC)    Emesis, persistent 06/10/2021   Encounter to establish care with new doctor 11/02/2020   Fall 05/06/2021   Flatulence, eructation and gas pain 11/03/2020   Gastric dilation 06/10/2021   Headache    Hiatal hernia    Hyperlipidemia    Lower extremity edema 11/02/2020   Menopausal symptoms 11/02/2020   Otitis media 08/30/2021   Pain of right hip 05/11/2023   Pneumonia    Polyp of stomach and duodenum    Pyelonephritis of right kidney 04/10/2023   Upper GI bleed 02/03/2021   Viral URI 08/23/2021   Current Outpatient Medications on File Prior to Visit  Medication Sig Dispense Refill   albuterol (VENTOLIN HFA) 108 (90 Base) MCG/ACT inhaler TAKE 2 PUFFS BY MOUTH EVERY 6 HOURS AS NEEDED FOR WHEEZE OR SHORTNESS OF BREATH 18 each 0   aspirin 81 MG chewable tablet Chew 1 tablet (81 mg total) by mouth daily. 90 tablet 1   blood glucose meter kit and supplies Testing at least once per day (morning  fasting), with option to test up to 4 times per day (morning fasting and as needed for symptoms) as needed for better control. Use only the strips that come in this kit with this meter- other strips will not work properly. 1 each 99   Blood Glucose Monitoring Suppl (ACCU-CHEK GUIDE ME) w/Device KIT 1 EACH BY DOES NOT APPLY ROUTE IN THE MORNING, AT NOON, AND AT BEDTIME. MAY SUBSTITUTE TO ANY MANUFACTURER COVERED BY PATIENT'S INSURANCE. 1 kit 0   calcium carbonate (OSCAL) 1500 (600 Ca) MG TABS tablet Take 0.4 tablets (600 mg total) by mouth daily. 90 tablet 1   fluticasone (FLONASE) 50 MCG/ACT nasal spray SPRAY 2 SPRAYS INTO EACH NOSTRIL EVERY DAY 48 mL 0   glipiZIDE (GLUCOTROL) 5 MG tablet Take 2 tablets (10 mg total) by mouth at bedtime. 180 tablet 3   insulin glargine, 2 Unit Dial, (TOUJEO MAX SOLOSTAR) 300 UNIT/ML Solostar Pen Inject 14 Units into the skin at bedtime. Increase by 2 units every 4 days if AM fasting blood sugar is higher than 120. Goal blood sugar 100-120.     Insulin Pen Needle (COMFORT EZ MICRO PEN NEEDLES) 32G X 4 MM MISC 1 Needle by Does not apply route daily. 100 each 1   lisinopril (ZESTRIL) 20 MG tablet TAKE 1 TABLET BY MOUTH EVERY DAY 90 tablet  1   loratadine (CLARITIN) 10 MG tablet Take 1 tablet (10 mg total) by mouth daily. 90 tablet 3   meloxicam (MOBIC) 7.5 MG tablet TAKE 1 TABLET BY MOUTH EVERY DAY 90 tablet 1   metFORMIN (GLUCOPHAGE) 500 MG tablet Take 1 tablet (500 mg total) by mouth daily with breakfast. 90 tablet 3   metoprolol succinate (TOPROL-XL) 25 MG 24 hr tablet Take 1 tablet (25 mg total) by mouth daily. For headache prevention 90 tablet 3   Multiple Vitamin (MULTIVITAMIN WITH MINERALS) TABS tablet Take 1 tablet by mouth daily. Women's 50+ Multivitamin     Omega-3 Fatty Acids (FISH OIL PO) Take 2,000 mg by mouth daily.     pantoprazole (PROTONIX) 40 MG tablet Take 1 tablet (40 mg total) by mouth 2 (two) times daily. 180 tablet 3   PARoxetine (PAXIL) 30 MG  tablet Take 1 tablet (30 mg total) by mouth daily. 90 tablet 3   rosuvastatin (CRESTOR) 20 MG tablet Take 1 tablet (20 mg total) by mouth daily. 90 tablet 3   Vitamin D, Ergocalciferol, (DRISDOL) 1.25 MG (50000 UNIT) CAPS capsule Take 1 capsule (50,000 Units total) by mouth every 7 (seven) days. 12 capsule 3   rizatriptan (MAXALT) 10 MG tablet Take 1 tablet (10 mg total) by mouth as needed for migraine. May repeat in 2 hours if needed. Do not take more than 2 doses in 24 hours. (Patient not taking: Reported on 11/29/2023) 6 tablet 3   No current facility-administered medications on file prior to visit.     The following portions of the patient's history were reviewed and updated as appropriate: allergies, current medications, past family history, past medical history, past social history, past surgical history and problem list.  ROS Otherwise as in subjective above  Objective: BP 128/82   Pulse 90   Wt 193 lb 9.6 oz (87.8 kg)   SpO2 95%   BMI 31.25 kg/m   General appearance: alert, no distress, well developed, well nourished HEENT: normocephalic, sclerae anicteric, conjunctiva pink and moist, TMs pearly, nares with turbinated edema, mucoid discharge, mild erythema, pharynx with mild erythema Oral cavity: MMM, no lesions Neck: supple, no lymphadenopathy, no thyromegaly, no masses Heart: RRR, normal S1, S2, no murmurs Lungs: CTA bilaterally, no wheezes, rhonchi, or rales Pulses: 2+ radial pulses, 2+ pedal pulses, normal cap refill Ext: no edema   Assessment: Encounter Diagnoses  Name Primary?   Sinus pressure Yes   Purulent nasal discharge    Respiratory tract infection    Cough, unspecified type      Plan: Symptoms seems more related to sinusitis drainage and cough.  Medications prescribed today: Zpak antibiotic.   Make sure you complete the course of antibiotics.  Specific home care recommendations today include:  For worse cough, you can use Hycodan cough syrup  prescribed today.   Caution as this can cause drowsiness.  You may use over-the-counter Mucinex DM or Robitussin DM for cough and congestion.  Pain/fever relief: You may use over-the-counter Acetaminophen/Tylenol for pain or fever  Patient voiced understanding of plan and recommendations above.  Patient was advised to call or recheck if not much improved or worse in the next 4-5 days.   Tawni was seen today for other.  Diagnoses and all orders for this visit:  Sinus pressure  Purulent nasal discharge  Respiratory tract infection  Cough, unspecified type    Follow up: prn

## 2023-12-05 ENCOUNTER — Telehealth: Payer: Self-pay

## 2023-12-05 ENCOUNTER — Encounter: Payer: Self-pay | Admitting: Nurse Practitioner

## 2023-12-05 ENCOUNTER — Ambulatory Visit: Payer: Medicare Other | Admitting: Nurse Practitioner

## 2023-12-05 VITALS — BP 124/80 | HR 80 | Wt 194.8 lb

## 2023-12-05 DIAGNOSIS — E1165 Type 2 diabetes mellitus with hyperglycemia: Secondary | ICD-10-CM

## 2023-12-05 DIAGNOSIS — R7989 Other specified abnormal findings of blood chemistry: Secondary | ICD-10-CM | POA: Diagnosis not present

## 2023-12-05 DIAGNOSIS — E559 Vitamin D deficiency, unspecified: Secondary | ICD-10-CM

## 2023-12-05 DIAGNOSIS — E1169 Type 2 diabetes mellitus with other specified complication: Secondary | ICD-10-CM | POA: Diagnosis not present

## 2023-12-05 DIAGNOSIS — I152 Hypertension secondary to endocrine disorders: Secondary | ICD-10-CM

## 2023-12-05 DIAGNOSIS — E11649 Type 2 diabetes mellitus with hypoglycemia without coma: Secondary | ICD-10-CM | POA: Diagnosis not present

## 2023-12-05 DIAGNOSIS — E785 Hyperlipidemia, unspecified: Secondary | ICD-10-CM

## 2023-12-05 DIAGNOSIS — Z794 Long term (current) use of insulin: Secondary | ICD-10-CM

## 2023-12-05 DIAGNOSIS — E041 Nontoxic single thyroid nodule: Secondary | ICD-10-CM

## 2023-12-05 DIAGNOSIS — Z6832 Body mass index (BMI) 32.0-32.9, adult: Secondary | ICD-10-CM

## 2023-12-05 DIAGNOSIS — E1159 Type 2 diabetes mellitus with other circulatory complications: Secondary | ICD-10-CM

## 2023-12-05 LAB — LIPID PANEL

## 2023-12-05 MED ORDER — LISINOPRIL 20 MG PO TABS: 20.0000 mg | ORAL_TABLET | Freq: Every day | ORAL | 3 refills | Status: DC

## 2023-12-05 MED ORDER — OZEMPIC (0.25 OR 0.5 MG/DOSE) 2 MG/1.5ML ~~LOC~~ SOPN: PEN_INJECTOR | SUBCUTANEOUS | Status: DC

## 2023-12-05 NOTE — Assessment & Plan Note (Signed)
 She experiences fluctuating blood glucose levels with episodes of hyperglycemia and some hypoglycemia. She is unaware of hypoglycemic episodes. Currently on metformin twice daily (recently increased from once daily) and Toujeo 20 units nightly. Approved for Ozempic, expected to improve glycemic control. 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 Discussed potential benefits of CGM systems particularly due to hypoglycemic episodes and insulin use. Emphasized dietary management, particularly carbohydrate and protein balance to prevent glycemic variability. Explained Toujeo's long-acting insulin profile with lower hypoglycemia risk compared to rapid-acting insulin. Explained the use of Ozempic and the dose titration. Explained the titration of Toujeo up and down and the importance of close monitoring for blood sugars to begin to drop with the start of Ozempic. Her most recent blood sugars have been in the 200-400 range, which is quite higher than what she had been reporting and her last lab work. I suspect that missing Toujeo played a significant role in this. Illness may have also been a factor. We discussed the continuation of current medications and holding the increase in Toujeo until she receives a CGM to reduce the risk of nighttime drops not being caught. We discussed what to do with hypoglycemia, the symptoms, and when to seek emergency care. We will plan to taper the toujeo as her blood sugars begin to fall. I would like to remove the glipizde first, we will plan to do that once blood sugars are less than 180 on average. I am hopeful we will be able to remove the Toujeo then metformin from her regimen and have her stabilized on Ozempic alone.   - Continue metformin twice daily. - Continue Toujeo 20 units nightly- titrate up as directed based on blood sugars. Titrate down by 2 units every day fasting blood sugars are around 120 (120-140). Monitor very closely  and notify if blood sugars are dropping down to 80-90.  Hold the dose for that day if you are unable to get in touch with me. I would rather have high numbers for a few days than too low.  - Initiate Ozempic 0.25 mg weekly, titrate to 0.5 mg after 4 weeks, then to 1 mg, and finally to 2 mg as tolerated. - Monitor blood glucose closely, especially fasting levels. - Plan to stop glipizide once blood sugars are in the 180 range fasting on average.  - Refer to endocrinologist for further management as requested. - Refer to dietitian for diabetes management as requested. - Verify insurance coverage for CGM and proceed with preferred device. - Handout provided with food options.

## 2023-12-05 NOTE — Progress Notes (Signed)
   12/05/2023  Patient ID: Kendra Miles, female   DOB: Oct 28, 1950, 73 y.o.   MRN: [id number]  Attempted to reach patient to discuss CGM coverage. Had to leave a voicemail message.  Started order via parachute through [employer] company, with PCP approval.  Carnell Christian, PharmD Clinical Pharmacist [phone]

## 2023-12-05 NOTE — Assessment & Plan Note (Signed)
 Repeat vitamin D labs to ensure appropriate levels to reduce risks of bone loss.

## 2023-12-05 NOTE — Assessment & Plan Note (Signed)
 Repeat labs:

## 2023-12-05 NOTE — Patient Instructions (Addendum)
 We will find out what meter your insurance prefers and I will send that to the pharmacy. If you would like you can bring it in to [place] and we will help you get this set up when you get it.   For the [facility] with 0.25 mg once a week for 4 weeks - Then .5mg  once a week for 6 weeks - Then 1mg  once a week for 4 weeks - Then 2mg  once a week indefinitely.    Watch your blood sugars closely as you go up on the Ozempic. You will need to come down by 2 units on the Toujeo every morning that your blood sugar is less than 120 while you are fasting. You may eventually be able to taper off of this completely.  If your blood sugars are dropping lower than 80, please let me know.   A low of 50 or less needs to be treated immediately.  You want 15 grams of carbs (like 4 ounces of orange juice) and recheck your blood sugar after 15 minutes. If your blood sugar is up above 50, eat protein and continue to monitor closely. If your blood sugar does not come up, repeat the process above. If it still does not come up, then call 911.   For now, you can hold increasing the Toujeo until you have the continuous meter so we don't worry about it dropping.   I will send a referral to endocrinology for evaluation.  I will also send a referral to the nutritionist to go over recommendations for you for diet.   I recommend getting at least 20 minutes of daily exercise to help with your blood sugars and drink plenty of water.    Here are some basic guidelines for managing your blood sugar Calories: about 1800 calories per day Carbohydrates: 150-180 grams of carbohydrates per day  Why: Gives your body enough "quick fuel" for cells to maintain normal function without sending them into starvation mode.  Protein: At least 90 grams of protein per day- 30 grams with each meal Why: Protein takes longer and uses more energy than carbohydrates to break down for fuel. The carbohydrates in your meals serves as quick energy  sources and proteins help use some of that extra quick energy to break down to produce long term energy. This helps you not feel hungry as quickly and protein breakdown burns calories.  Water: Drink AT LEAST 64 ounces of water per day  Why: Water is essential to healthy metabolism. Water helps to fill the stomach and keep you fuller longer. Water is required for healthy digestion and filtering of waste in the body.  Fat: Limit fats in your diet- when choosing fats, choose foods with lower fats content such as lean meats (chicken, fish, [place]).  Why: Increased fat intake leads to storage "for later". Once you burn your carbohydrate energy, your body goes into fat and protein breakdown mode to help you loose weight.  Cholesterol: Fats and oils that are LIQUID at room temperature are best. Choose vegetable oils (olive oil, avocado oil, nuts). Avoid fats that are SOLID at room temperature (animal fats, processed meats). Healthy fats are often found in whole grains, beans, nuts, seeds, and berries.  Why: Elevated cholesterol levels lead to build up of cholesterol on the inside of your blood vessels. This will eventually cause the blood vessels to become hard and can lead to high blood pressure and damage to your organs. When the blood flow is  reduced, but the pressure is high from cholesterol buildup, parts of the cholesterol can break off and form clots that can go to the brain or heart leading to a stroke or heart attack.  Fiber: Increase amount of SOLUBLE the fiber in your diet. This helps to fill you up, lowers cholesterol, and helps with digestion. Some foods high in soluble fiber are oats, peas, beans, apples, carrots, barley, and citrus fruits.   Why: Fiber fills you up, helps remove excess cholesterol, and aids in healthy digestion which are all very important in weight management.   I recommend the following as a minimum activity routine: Purposeful walk or other physical activity at least 20 minutes  every single day. This means purposefully taking a walk, jog, bike, swim, treadmill, elliptical, dance, etc.  This activity should be ABOVE your normal daily activities, such as walking at work. Goal exercise should be at least 150 minutes a week- work your way up to this.   Heart Rate: Your maximum exercise heart rate should be 220 - Your Age in Years. When exercising, get your heart rate up, but avoid going over the maximum targeted heart rate.  60-70% of your maximum heart rate is where you tend to burn the most fat. To find this number:  220 - Age In Years= Max HR  Max HR x 0.6 (or 0.7) = Fat Burning HR The Fat Burning HR is your goal heart rate while working out to burn the most fat.  NEVER exercise to the point your feel lightheaded, weak, nauseated, dizzy. If you experience ANY of these symptoms- STOP exercise! Allow yourself to cool down and your heart rate to come down. Then restart slower next time.  If at ANY TIME you feel chest pain or chest pressure during exercise, STOP IMMEDIATELY and seek medical attention.

## 2023-12-05 NOTE — Assessment & Plan Note (Addendum)
 Cholesterol level goals less than 70. She has not been at goal. I have expressed concern with her previous lab results and difficulty with control. She is reportedly taking rosuvastatin. Cost is an issue with medication management and this does limit our options. It does not appear that she has seen cardiology recently that I can see. I do feel that she would greatly benefit from their input on management recommendations. I will plan to discuss this with her once results are in to ensure that she is not seeing someone outside of the system.  - Continue current management and monitor lipid levels.

## 2023-12-05 NOTE — Assessment & Plan Note (Signed)
 Repeat thyroid labs

## 2023-12-05 NOTE — Assessment & Plan Note (Signed)
 Recommend diet, exercise, and medication management to aid in weight loss for overall health. Will start Ozempic for control of blood sugars and hopefully we will see benefit with weight management.

## 2023-12-05 NOTE — Progress Notes (Signed)
 Kendra Fennel, DNP, AGNP-c [facility]  [address] [place], [place] [address] [phone]  ESTABLISHED PATIENT- Chronic Health and/or Follow-Up Visit  Blood pressure 124/80, pulse 80, weight 194 lb 12.8 oz (88.4 kg).    Kendra Miles is a 73 y.o. year old female presenting today for evaluation and management of chronic conditions.   History of Present Illness Kendra Miles is a 73 year old female with diabetes who presents for follow-up for DM, HTN, HLD with fluctuating blood sugar levels. She is here with her daughter.   She has been experiencing significant fluctuations in her blood sugar levels, which recently led to a visit to the emergency room due to hyperglycemia. She is using a new glucose meter for more accurate monitoring. Her daughter reports she has been ensuring that [facility] monitors her blood sugars several times a day. Her current medication regimen includes metformin twice daily (just recently increased from once daily), glipizide, and Toujeo 20 units at night. She missed a week of Toujeo due to travel without refrigeration. She has been approved for Ozempic, which she has not yet started. Her blood sugar levels have been inconsistent, with some episodes of hypoglycemia reported as well.   She reports symptoms suggestive of Kendra Miles, such as itching in her feet, but no sharp pains, numbness, or tingling. She is concerned about the potential for Miles due to her fluctuating blood sugar levels.  She also endorses symptoms of swelling in her ankles, which typically resolves overnight, increased urination, and seasonal allergies.   No chest pain, shortness of breath, or dizziness.   Her daughter requests a CGM and referral to diabetes & nutrition education and endocrinology.  All ROS negative with exception of what is listed above.   PHYSICAL EXAM Physical Exam Vitals and nursing note reviewed.  Constitutional:      General: She is  not in acute distress.    Appearance: Normal appearance. She is obese. She is not ill-appearing.  HENT:     Head: Normocephalic.  Eyes:     Conjunctiva/sclera: Conjunctivae normal.  Cardiovascular:     Rate and Rhythm: Normal rate and regular rhythm.     Pulses: Normal pulses.     Heart sounds: Normal heart sounds.  Pulmonary:     Effort: Pulmonary effort is normal.     Breath sounds: Normal breath sounds. No wheezing.  Abdominal:     General: Bowel sounds are normal.     Palpations: Abdomen is soft.  Musculoskeletal:     Right lower leg: No edema.     Left lower leg: No edema.  Skin:    General: Skin is warm and dry.     Capillary Refill: Capillary refill takes less than 2 seconds.  Neurological:     Mental Status: She is alert.     Sensory: No sensory deficit.     Motor: No weakness.     Coordination: Coordination normal.  Psychiatric:        Behavior: Behavior normal.      PLAN Problem List Items Addressed This Visit     Type 2 diabetes mellitus (HCC)   She experiences fluctuating blood glucose levels with episodes of hyperglycemia and some hypoglycemia. She is unaware of hypoglycemic episodes. Currently on metformin twice daily (recently increased from once daily) and Toujeo 20 units nightly. Approved for Ozempic, expected to improve glycemic control. Symptoms suggest Kendra Miles Miles in feet due to hyperglycemia, but foot exam normal today. Discussed potential benefits of CGM  systems particularly due to hypoglycemic episodes and insulin use. Emphasized dietary management, particularly carbohydrate and protein balance to prevent glycemic variability. Explained Toujeo's long-acting insulin profile with lower hypoglycemia risk compared to rapid-acting insulin. Explained the use of Ozempic and the dose titration. Explained the titration of Toujeo up and down and the importance of close monitoring for blood sugars to begin to drop with the start of Ozempic. Her most recent blood  sugars have been in the 200-400 range, which is quite higher than what she had been reporting and her last lab work. I suspect that missing Toujeo played a significant role in this. Illness may have also been a factor. We discussed the continuation of current medications and holding the increase in Toujeo until she receives a CGM to reduce the risk of nighttime drops not being caught. We discussed what to do with hypoglycemia, the symptoms, and when to seek emergency care. We will plan to taper the toujeo as her blood sugars begin to fall. I would like to remove the glipizde first, we will plan to do that once blood sugars are less than 180 on average. I am hopeful we will be able to remove the Toujeo then metformin from her regimen and have her stabilized on Ozempic alone.   - Continue metformin twice daily. - Continue Toujeo 20 units nightly- titrate up as directed based on blood sugars. Titrate down by 2 units every day fasting blood sugars are around 120 (120-140). Monitor very closely and notify if blood sugars are dropping down to 80-90.  Hold the dose for that day if you are unable to get in touch with me. I would rather have high numbers for a few days than too low.  - Initiate Ozempic 0.25 mg weekly, titrate to 0.5 mg after 4 weeks, then to 1 mg, and finally to 2 mg as tolerated. - Monitor blood glucose closely, especially fasting levels. - Plan to stop glipizide once blood sugars are in the 180 range fasting on average.  - Refer to endocrinologist for further management as requested. - Refer to dietitian for diabetes management as requested. - Verify insurance coverage for CGM and proceed with preferred device. - Handout provided with food options.      Relevant Medications   Semaglutide,0.25 or 0.5MG /DOS, (OZEMPIC, 0.25 OR 0.5 MG/DOSE,) 2 MG/1.5ML SOPN   lisinopril (ZESTRIL) 20 MG tablet   Other Relevant Orders   Hemoglobin A1c   CBC with Differential/Platelet   Comprehensive metabolic  panel with GFR   Lipid panel   TSH   Hemoglobin A1c   CBC with Differential/Platelet   Comprehensive metabolic panel with GFR   Lipid panel   TSH   AMB Referral VBCI Care Management   Referral to Nutrition and Diabetes Services   Hyperlipidemia associated with type 2 diabetes mellitus (HCC)   Cholesterol level goals less than 70. She has not been at goal. I have expressed concern with her previous lab results and difficulty with control. She is reportedly taking rosuvastatin. Cost is an issue with medication management and this does limit our options. It does not appear that she has seen cardiology recently that I can see. I do feel that she would greatly benefit from their input on management recommendations. I will plan to discuss this with her once results are in to ensure that she is not seeing someone outside of the system.  - Continue current management and monitor lipid levels.      Relevant Medications   Semaglutide,0.25  or 0.5MG /DOS, (OZEMPIC, 0.25 OR 0.5 MG/DOSE,) 2 MG/1.5ML SOPN   lisinopril (ZESTRIL) 20 MG tablet   Other Relevant Orders   Hemoglobin A1c   CBC with Differential/Platelet   Comprehensive metabolic panel with GFR   Lipid panel   TSH   AMB Referral VBCI Care Management   Thyroid nodule   Repeat thyroid labs.       Relevant Orders   Hemoglobin A1c   CBC with Differential/Platelet   Comprehensive metabolic panel with GFR   Lipid panel   TSH   Vitamin D deficiency   Repeat vitamin D labs to ensure appropriate levels to reduce risks of bone loss.       Relevant Orders   Hemoglobin A1c   CBC with Differential/Platelet   Comprehensive metabolic panel with GFR   Lipid panel   TSH   Elevated LFTs   Repeat labs      Relevant Orders   Hemoglobin A1c   CBC with Differential/Platelet   Comprehensive metabolic panel with GFR   Lipid panel   TSH   AMB Referral VBCI Care Management   BMI 32.0-32.9,adult   Recommend diet, exercise, and medication  management to aid in weight loss for overall health. Will start Ozempic for control of blood sugars and hopefully we will see benefit with weight management.       Relevant Orders   Hemoglobin A1c   CBC with Differential/Platelet   Comprehensive metabolic panel with GFR   Lipid panel   TSH   Hypertension associated with diabetes (HCC)   Relevant Medications   Semaglutide,0.25 or 0.5MG /DOS, (OZEMPIC, 0.25 OR 0.5 MG/DOSE,) 2 MG/1.5ML SOPN   lisinopril (ZESTRIL) 20 MG tablet   Other Relevant Orders   Hemoglobin A1c   CBC with Differential/Platelet   Comprehensive metabolic panel with GFR   Lipid panel   TSH   AMB Referral VBCI Care Management   Other Visit Diagnoses       Hypoglycemia due to type 2 diabetes mellitus (HCC)    -  Primary   Relevant Medications   Semaglutide,0.25 or 0.5MG /DOS, (OZEMPIC, 0.25 OR 0.5 MG/DOSE,) 2 MG/1.5ML SOPN   lisinopril (ZESTRIL) 20 MG tablet   Other Relevant Orders   Hemoglobin A1c   CBC with Differential/Platelet   Comprehensive metabolic panel with GFR   Lipid panel   TSH   AMB Referral VBCI Care Management   Referral to Nutrition and Diabetes Services       Return in about 3 months (around 03/05/2024) for Diabetes, Med Management 30.  SaraBeth Francisco Ostrovsky, DNP, AGNP-c Time: 65 minutes, >50% spent counseling, care coordination, chart review, and documentation.  Review of hospital notes x 2 and historic cardiology notes.

## 2023-12-06 ENCOUNTER — Telehealth: Payer: Self-pay | Admitting: *Deleted

## 2023-12-06 LAB — COMPREHENSIVE METABOLIC PANEL WITH GFR
ALT: 15 IU/L (ref 0–32)
AST: 14 IU/L (ref 0–40)
Albumin: 4.4 g/dL (ref 3.9–4.9)
Alkaline Phosphatase: 115 IU/L (ref 44–121)
BUN/Creatinine Ratio: 23 (ref 12–28)
BUN: 20 mg/dL (ref 8–27)
Bilirubin Total: 0.5 mg/dL (ref 0.0–1.2)
CO2: 26 mmol/L (ref 20–29)
Calcium: 9.6 mg/dL (ref 8.7–10.3)
Chloride: 102 mmol/L (ref 96–106)
Creatinine, Ser: 0.88 mg/dL (ref 0.57–1.00)
Globulin, Total: 2.2 g/dL (ref 1.5–4.5)
Glucose: 156 mg/dL — ABNORMAL HIGH (ref 70–99)
Potassium: 4.8 mmol/L (ref 3.5–5.2)
Sodium: 144 mmol/L (ref 134–144)
Total Protein: 6.6 g/dL (ref 6.0–8.5)
eGFR: 71 mL/min/{1.73_m2} (ref >59)

## 2023-12-06 LAB — CBC WITH DIFFERENTIAL/PLATELET
Basophils Absolute: 0.1 10*3/uL (ref 0.0–0.2)
Basos: 1 %
EOS (ABSOLUTE): 0.3 10*3/uL (ref 0.0–0.4)
Eos: 3 %
Hematocrit: 49 % — ABNORMAL HIGH (ref 34.0–46.6)
Hemoglobin: 15.9 g/dL (ref 11.1–15.9)
Immature Grans (Abs): 0 10*3/uL (ref 0.0–0.1)
Immature Granulocytes: 0 %
Lymphocytes Absolute: 2.6 10*3/uL (ref 0.7–3.1)
Lymphs: 33 %
MCH: 29.3 pg (ref 26.6–33.0)
MCHC: 32.4 g/dL (ref 31.5–35.7)
MCV: 90 fL (ref 79–97)
Monocytes Absolute: 0.5 10*3/uL (ref 0.1–0.9)
Monocytes: 6 %
Neutrophils Absolute: 4.6 10*3/uL (ref 1.4–7.0)
Neutrophils: 57 %
Platelets: 305 10*3/uL (ref 150–450)
RBC: 5.43 x10E6/uL — ABNORMAL HIGH (ref 3.77–5.28)
RDW: 14.4 % (ref 11.7–15.4)
WBC: 7.9 10*3/uL (ref 3.4–10.8)

## 2023-12-06 LAB — LIPID PANEL
Cholesterol, Total: 148 mg/dL (ref 100–199)
HDL: 41 mg/dL (ref >39)
LDL CALC COMMENT:: 3.6 ratio (ref 0.0–4.4)
LDL Chol Calc (NIH): 70 mg/dL (ref 0–99)
Triglycerides: 222 mg/dL — ABNORMAL HIGH (ref 0–149)
VLDL Cholesterol Cal: 37 mg/dL (ref 5–40)

## 2023-12-06 LAB — HEMOGLOBIN A1C
Est. average glucose Bld gHb Est-mCnc: 269 mg/dL
Hgb A1c MFr Bld: 11 % — ABNORMAL HIGH (ref 4.8–5.6)

## 2023-12-06 LAB — TSH: TSH: 1.44 u[IU]/mL (ref 0.450–4.500)

## 2023-12-06 NOTE — Progress Notes (Signed)
 Care Guide Pharmacy Note  12/06/2023 Name: Kendra Miles MRN: [id number] DOB: 1951/05/08    Referred By: Annella Kief, NP Reason for referral: Complex Care Management (Unsuccessful outreach to schedule referral with PharmD [facility] )   Kendra Miles is a 73 y.o. year old female who is a primary care patient of Early, Sara E, NP.  Kendra Miles was referred to the pharmacist for assistance related to: DMII  A second unsuccessful telephone outreach was attempted today to contact the patient who was referred to the pharmacy team for assistance with medication management. Additional attempts will be made to contact the patient.  Kendra Miles  [facility]  [facility], [facility] Guide  Direct Dial: [phone]  Fax [phone]

## 2023-12-10 ENCOUNTER — Encounter: Payer: Self-pay | Admitting: Nurse Practitioner

## 2023-12-12 ENCOUNTER — Encounter: Attending: Nurse Practitioner | Admitting: Dietician

## 2023-12-12 ENCOUNTER — Encounter: Payer: Self-pay | Admitting: Nurse Practitioner

## 2023-12-12 DIAGNOSIS — Z794 Long term (current) use of insulin: Secondary | ICD-10-CM | POA: Diagnosis present

## 2023-12-12 DIAGNOSIS — E11649 Type 2 diabetes mellitus with hypoglycemia without coma: Secondary | ICD-10-CM | POA: Insufficient documentation

## 2023-12-12 DIAGNOSIS — E1169 Type 2 diabetes mellitus with other specified complication: Secondary | ICD-10-CM | POA: Insufficient documentation

## 2023-12-12 NOTE — Patient Instructions (Signed)
 Aim for balanced meals on a schedule Purchase and carry a fast acting sugar source

## 2023-12-12 NOTE — Progress Notes (Signed)
 Diabetes Self-Management Education  Visit Type: First/Initial  Appt. Start Time: 1702 Appt. End Time: 1802  12/12/2023  Kendra Miles, identified by name and date of birth, is a 73 y.o. female with a diagnosis of Diabetes: Type 2.   ASSESSMENT   Patient is here today with her daughter in law, "Vickie" and "Polly Brink".  Patient would like to learn more about nutrition for diabetes.  Patient lives with daughter in law,  her son and 3 grandchildren.  Pt's daughter in law does the shopping and cooking. Pt reports eating out 3-4 times weekly All Pt and this families questions were answered during this enocunter.   History includes:   Past Medical History:  Diagnosis Date   Acid reflux    Arthritis    Back pain 11/02/2020   Calcium  deficiency 11/02/2020   Choledocholithiasis with obstruction 03/06/2019   Cholelithiasis with choledocholithiasis 02/2019   Contusion of face 02/17/2022   Diabetes (HCC)    Emesis, persistent 06/10/2021   Encounter to establish care with new doctor 11/02/2020   Fall 05/06/2021   Flatulence, eructation and gas pain 11/03/2020   Gastric dilation 06/10/2021   Headache    Hiatal hernia    Hyperlipidemia    Lower extremity edema 11/02/2020   Menopausal symptoms 11/02/2020   Otitis media 08/30/2021   Pain of right hip 05/11/2023   Pneumonia    Polyp of stomach and duodenum    Pyelonephritis of right kidney 04/10/2023   Upper GI bleed 02/03/2021   Viral URI 08/23/2021    Medications include:   Current Outpatient Medications:    glipiZIDE  (GLUCOTROL ) 5 MG tablet, Take 2 tablets (10 mg total) by mouth at bedtime., Disp: 180 tablet, Rfl: 3   insulin  glargine, 2 Unit Dial, (TOUJEO  MAX SOLOSTAR) 300 UNIT/ML Solostar Pen, Inject 14 Units into the skin at bedtime. Increase by 2 units every 4 days if AM fasting blood sugar is higher than 120. Goal blood sugar 100-120., Disp: , Rfl:    metFORMIN  (GLUCOPHAGE ) 500 MG tablet, Take 1 tablet (500 mg total) by  mouth daily with breakfast., Disp: 90 tablet, Rfl: 3   Semaglutide ,0.25 or 0.5MG /DOS, (OZEMPIC , 0.25 OR 0.5 MG/DOSE,) 2 MG/1.5ML SOPN, Inject 0.25 mg as directed once a week for 28 days, THEN 0.5 mg once a week. Pen 1 and 2.., Disp: , Rfl:    albuterol  (VENTOLIN  HFA) 108 (90 Base) MCG/ACT inhaler, TAKE 2 PUFFS BY MOUTH EVERY 6 HOURS AS NEEDED FOR WHEEZE OR SHORTNESS OF BREATH, Disp: 18 each, Rfl: 0   aspirin  81 MG chewable tablet, Chew 1 tablet (81 mg total) by mouth daily., Disp: 90 tablet, Rfl: 1   azithromycin  (ZITHROMAX ) 250 MG tablet, 2 tablets day 1, then 1 tablet days 2-4 (Patient not taking: Reported on 12/05/2023), Disp: 6 tablet, Rfl: 0   blood glucose meter kit and supplies, Testing at least once per day (morning fasting), with option to test up to 4 times per day (morning fasting and as needed for symptoms) as needed for better control. Use only the strips that come in this kit with this meter- other strips will not work properly., Disp: 1 each, Rfl: 99   Blood Glucose Monitoring Suppl (ACCU-CHEK GUIDE ME) w/Device KIT, 1 EACH BY DOES NOT APPLY ROUTE IN THE MORNING, AT NOON, AND AT BEDTIME. MAY SUBSTITUTE TO ANY MANUFACTURER COVERED BY PATIENT'S INSURANCE., Disp: 1 kit, Rfl: 0   calcium  carbonate (OSCAL) 1500 (600 Ca) MG TABS tablet, Take 0.4 tablets (600 mg  total) by mouth daily., Disp: 90 tablet, Rfl: 1   fluticasone  (FLONASE ) 50 MCG/ACT nasal spray, SPRAY 2 SPRAYS INTO EACH NOSTRIL EVERY DAY, Disp: 48 mL, Rfl: 0   Insulin  Pen Needle (COMFORT EZ MICRO PEN NEEDLES) 32G X 4 MM MISC, 1 Needle by Does not apply route daily., Disp: 100 each, Rfl: 1   lisinopril  (ZESTRIL ) 20 MG tablet, Take 1 tablet (20 mg total) by mouth daily., Disp: 90 tablet, Rfl: 3   loratadine  (CLARITIN ) 10 MG tablet, Take 1 tablet (10 mg total) by mouth daily., Disp: 90 tablet, Rfl: 3   meloxicam  (MOBIC ) 7.5 MG tablet, TAKE 1 TABLET BY MOUTH EVERY DAY, Disp: 90 tablet, Rfl: 1   metoprolol  succinate (TOPROL -XL) 25 MG 24 hr  tablet, Take 1 tablet (25 mg total) by mouth daily. For headache prevention, Disp: 90 tablet, Rfl: 3   Multiple Vitamin (MULTIVITAMIN WITH MINERALS) TABS tablet, Take 1 tablet by mouth daily. Women's 50+ Multivitamin, Disp: , Rfl:    Omega-3 Fatty Acids (FISH OIL  PO), Take 2,000 mg by mouth daily., Disp: , Rfl:    pantoprazole  (PROTONIX ) 40 MG tablet, Take 1 tablet (40 mg total) by mouth 2 (two) times daily., Disp: 180 tablet, Rfl: 3   PARoxetine  (PAXIL ) 30 MG tablet, Take 1 tablet (30 mg total) by mouth daily., Disp: 90 tablet, Rfl: 3   rizatriptan  (MAXALT ) 10 MG tablet, Take 1 tablet (10 mg total) by mouth as needed for migraine. May repeat in 2 hours if needed. Do not take more than 2 doses in 24 hours., Disp: 6 tablet, Rfl: 3   rosuvastatin  (CRESTOR ) 20 MG tablet, Take 1 tablet (20 mg total) by mouth daily., Disp: 90 tablet, Rfl: 3   Vitamin D , Ergocalciferol , (DRISDOL ) 1.25 MG (50000 UNIT) CAPS capsule, Take 1 capsule (50,000 Units total) by mouth every 7 (seven) days., Disp: 12 capsule, Rfl: 3  Labs noted:   Lab Results  Component Value Date   HGBA1C 11.0 (H) 12/05/2023    Lab Results  Component Value Date   CHOL 148 12/05/2023   HDL 41 12/05/2023   LDLCALC 70 12/05/2023   LDLDIRECT 176 (H) 05/04/2023   TRIG 222 (H) 12/05/2023   CHOLHDL 3.6 12/05/2023     There were no vitals taken for this visit. There is no height or weight on file to calculate BMI.   Diabetes Self-Management Education - 12/12/23 1706       Visit Information   Visit Type First/Initial      Initial Visit   Diabetes Type Type 2    Date Diagnosed 10 years ago    Are you currently following a meal plan? No    Are you taking your medications as prescribed? Yes      Health Coping   How would you rate your overall health? Fair      Psychosocial Assessment   Patient Belief/Attitude about Diabetes Denial    What is the hardest part about your diabetes right now, causing you the most concern, or is the most  worrisome to you about your diabetes?   Taking/obtaining medications    Self-care barriers None    Self-management support Doctor's office    Other persons present Patient;Family Member    Patient Concerns Medication    Special Needs None    Preferred Learning Style No preference indicated    Learning Readiness Change in progress    How often do you need to have someone help you when you read instructions, pamphlets,  or other written materials from your doctor or pharmacy? 1 - Never    What is the last grade level you completed in school? 12th      Pre-Education Assessment   Patient understands the diabetes disease and treatment process. Needs Instruction    Patient understands incorporating nutritional management into lifestyle. Needs Instruction    Patient undertands incorporating physical activity into lifestyle. Needs Instruction    Patient understands using medications safely. Needs Instruction    Patient understands monitoring blood glucose, interpreting and using results Needs Instruction    Patient understands prevention, detection, and treatment of acute complications. Needs Instruction    Patient understands prevention, detection, and treatment of chronic complications. Needs Instruction    Patient understands how to develop strategies to address psychosocial issues. Needs Instruction    Patient understands how to develop strategies to promote health/change behavior. Needs Instruction      Complications   Last HgB A1C per patient/outside source 11 %    How often do you check your blood sugar? 1-2 times/day    Fasting Blood glucose range (mg/dL) 914-782    Postprandial Blood glucose range (mg/dL) >956    Number of hypoglycemic episodes per month 0    Number of hyperglycemic episodes ( >200mg /dL): Daily    Have you had a dilated eye exam in the past 12 months? No    Have you had a dental exam in the past 12 months? No    Are you checking your feet? Yes    How many days per  week are you checking your feet? 1      Dietary Intake   Breakfast [employer] BEC bagel, sweet tea or 2 slices of peanut butter toast, 2 packets peaches and cream instant oatmeal, ~ 1 cup 2%    Lunch sweet tea, skips 2/d/w or cheeseburger or lunch meat, cheese, 2 slices white bread, water    Dinner grilled chicken salad with dressing, zero soda    Beverage(s) water, sweet tea, zero soda      Activity / Exercise   Activity / Exercise Type ADL's;Light (walking / raking leaves)      Patient Education   Previous Diabetes Education No    Disease Pathophysiology Definition of diabetes, type 1 and 2, and the diagnosis of diabetes    Healthy Eating Plate Method;Food label reading, portion sizes and measuring food.;Role of diet in the treatment of diabetes and the relationship between the three main macronutrients and blood glucose level;Carbohydrate counting;Reviewed blood glucose goals for pre and post meals and how to evaluate the patients' food intake on their blood glucose level.    Being Active Role of exercise on diabetes management, blood pressure control and cardiac health.    Monitoring Identified appropriate SMBG and/or A1C goals.;Daily foot exams;Yearly dilated eye exam    Acute complications Taught prevention, symptoms, and  treatment of hypoglycemia - the 15 rule.    Chronic complications Relationship between chronic complications and blood glucose control;Dental care;Retinopathy and reason for yearly dilated eye exams    Diabetes Stress and Support Worked with patient to identify barriers to care and solutions    Preconception care --   n/a   Lifestyle and Health Coping Helped patient develop diabetes management plan for (enter comment)   hypo and hyper glcyemia     Individualized Goals (developed by patient)   Nutrition Follow meal plan discussed    Medications take my medication as prescribed    Monitoring  Test my blood glucose  as discussed    Problem Solving Eating  Pattern;Medication consistency    Reducing Risk do foot checks daily;treat hypoglycemia with 15 grams of carbs if blood glucose less than 70mg /dL    Health Coping Ask for help with psychological, social, or emotional issues      Post-Education Assessment   Patient understands the diabetes disease and treatment process. Comprehends key points    Patient understands incorporating nutritional management into lifestyle. Comprehends key points    Patient undertands incorporating physical activity into lifestyle. Needs Instruction    Patient understands using medications safely. Comphrehends key points    Patient understands monitoring blood glucose, interpreting and using results Comprehends key points    Patient understands prevention, detection, and treatment of acute complications. Needs Review    Patient understands prevention, detection, and treatment of chronic complications. Needs Review    Patient understands how to develop strategies to address psychosocial issues. Needs Review    Patient understands how to develop strategies to promote health/change behavior. Needs Review      Outcomes   Expected Outcomes Demonstrated limited interest in learning.  Expect minimal changes    Future DMSE 3-4 months    Program Status Not Completed             Individualized Plan for Diabetes Self-Management Training:   Learning Objective:  Patient will have a greater understanding of diabetes self-management. Patient education plan is to attend individual and/or group sessions per assessed needs and concerns.   Plan:   Patient Instructions  Aim for balanced meals on a schedule Purchase and carry a fast acting sugar source  Expected Outcomes:  Demonstrated limited interest in learning.  Expect minimal changes  Education material provided: ADA - How to Thrive: A Guide for Your Journey with Diabetes, My Plate, Snack sheet, and Support group flyer  If problems or questions, patient to contact  team via:  Phone  Future DSME appointment: 3-4 months

## 2023-12-14 ENCOUNTER — Other Ambulatory Visit: Payer: Self-pay

## 2023-12-14 DIAGNOSIS — H9313 Tinnitus, bilateral: Secondary | ICD-10-CM

## 2023-12-17 NOTE — Progress Notes (Signed)
 Care Guide Pharmacy Note  12/17/2023 Name: Kendra Miles MRN: [id number] DOB: 08/01/1951  Referred By: Annella Kief, NP Reason for referral: Complex Care Management (Unsuccessful outreach to schedule referral with [facility]D [facility] )   Kendra Miles is a 73 y.o. year old female who is a primary care patient of Early, Sara E, NP.  [name] was referred to the pharmacist for assistance related to: DMII  A third unsuccessful telephone outreach was attempted today to contact the patient who was referred to the pharmacy team for assistance with medication management. The Population Health team is pleased to engage with this patient at any time in the future upon receipt of referral and should he/she be interested in assistance from the Population Health team.  [name]  [facility]  [facility], [facility] Guide  Direct Dial: [phone]  Fax [phone]

## 2023-12-25 ENCOUNTER — Other Ambulatory Visit

## 2023-12-28 ENCOUNTER — Encounter (INDEPENDENT_AMBULATORY_CARE_PROVIDER_SITE_OTHER): Payer: Self-pay

## 2024-01-02 ENCOUNTER — Ambulatory Visit
Admission: RE | Admit: 2024-01-02 | Discharge: 2024-01-02 | Disposition: A | Discharge status: Home / Self Care | Payer: Medicare Other | Source: Ambulatory Visit | Attending: Nurse Practitioner | Admitting: Nurse Practitioner

## 2024-01-02 DIAGNOSIS — E2839 Other primary ovarian failure: Secondary | ICD-10-CM

## 2024-01-02 DIAGNOSIS — Z13820 Encounter for screening for osteoporosis: Secondary | ICD-10-CM

## 2024-01-04 ENCOUNTER — Encounter (INDEPENDENT_AMBULATORY_CARE_PROVIDER_SITE_OTHER): Payer: Self-pay

## 2024-01-15 ENCOUNTER — Encounter (INDEPENDENT_AMBULATORY_CARE_PROVIDER_SITE_OTHER): Payer: Self-pay

## 2024-01-15 ENCOUNTER — Ambulatory Visit: Payer: Self-pay | Admitting: Nurse Practitioner

## 2024-01-22 ENCOUNTER — Ambulatory Visit (INDEPENDENT_AMBULATORY_CARE_PROVIDER_SITE_OTHER): Admitting: Physician Assistant

## 2024-01-22 ENCOUNTER — Encounter (INDEPENDENT_AMBULATORY_CARE_PROVIDER_SITE_OTHER): Payer: Self-pay | Admitting: Physician Assistant

## 2024-01-22 ENCOUNTER — Ambulatory Visit (INDEPENDENT_AMBULATORY_CARE_PROVIDER_SITE_OTHER): Admitting: Audiology

## 2024-01-22 VITALS — BP 123/80 | HR 95 | Ht 66.0 in | Wt 195.0 lb

## 2024-01-22 DIAGNOSIS — H905 Unspecified sensorineural hearing loss: Secondary | ICD-10-CM

## 2024-01-22 DIAGNOSIS — H903 Sensorineural hearing loss, bilateral: Secondary | ICD-10-CM

## 2024-01-22 DIAGNOSIS — H9319 Tinnitus, unspecified ear: Secondary | ICD-10-CM

## 2024-01-22 DIAGNOSIS — H9313 Tinnitus, bilateral: Secondary | ICD-10-CM

## 2024-01-22 NOTE — Progress Notes (Signed)
 Dear Dr. Shirley Douglas, Here is my assessment for our mutual patient, Kendra Miles. Thank you for allowing me the opportunity to care for your patient. Please do not hesitate to contact me should you have any other questions. Sincerely, Belma Boxer PA-C  Otolaryngology Clinic Note Referring provider: Dr. Shirley Douglas HPI:  Kendra Miles is a 73 y.o. female kindly referred by Dr. Shirley Douglas   The patient is a 73 year old female seen in our office for evaluation of tinnitus.  The patient notes she has had high-pitched ringing in her bilateral ears since 1992.  She notes over the last year she has had increased volume of the tinnitus when yawning or coughing in the bilateral ears.  She denies any associated dizziness, she denies any pain.  She does note some decreased hearing with softer voices but does not report this causes significant difficulty.  She denies any head or neck trauma, no head or neck surgery other than tonsillectomy and adenoidectomy when she was a kid.   Independent Review of Additional Tests or Records:  Audiological evaluation 01/22/2024   Otoscopy: Right ear: Clear external ear canals and notable landmarks visualized on the tympanic membrane. Left ear:  Clear external ear canals and notable landmarks visualized on the tympanic membrane.   Tympanometry: Right ear: Type A- Normal external ear canal volume with normal middle ear pressure and tympanic membrane compliance. Left ear: Type A- Normal external ear canal volume with normal middle ear pressure and tympanic membrane compliance.   Pure tone Audiometry: Both ears: Borderline normal to severe sensorineural hearing loss from 125 Hz - 8000 Hz.   Speech Audiometry: Right ear- Speech Reception Threshold (SRT) was obtained at 35 dBHL. Left ear-Speech Reception Threshold (SRT) was obtained at 40 dBHL.   Word Recognition Score Tested using NU-6 (MLV) Right ear: 88% was obtained at a presentation level of 80 dBHL with contralateral masking  which is deemed as  good . Left ear: 80% was obtained at a presentation level of 80 dBHL with contralateral masking which is deemed as  good .   The hearing test results were completed under inserts and re-checked with headphones  and results are deemed to be of good reliability. Test technique:  conventional    PMH/Meds/All/SocHx/FamHx/ROS:   Past Medical History:  Diagnosis Date   Acid reflux    Arthritis    Back pain 11/02/2020   Calcium  deficiency 11/02/2020   Choledocholithiasis with obstruction 03/06/2019   Cholelithiasis with choledocholithiasis 02/2019   Contusion of face 02/17/2022   Diabetes (HCC)    Emesis, persistent 06/10/2021   Encounter to establish care with new doctor 11/02/2020   Fall 05/06/2021   Flatulence, eructation and gas pain 11/03/2020   Gastric dilation 06/10/2021   Headache    Hiatal hernia    Hyperlipidemia    Lower extremity edema 11/02/2020   Menopausal symptoms 11/02/2020   Otitis media 08/30/2021   Pain of right hip 05/11/2023   Pneumonia    Polyp of stomach and duodenum    Pyelonephritis of right kidney 04/10/2023   Upper GI bleed 02/03/2021   Viral URI 08/23/2021     Past Surgical History:  Procedure Laterality Date   ABDOMINAL HYSTERECTOMY     partial   APPENDECTOMY     BIOPSY  03/08/2019   Procedure: BIOPSY;  Surgeon: Kenney Peacemaker, MD;  Location: [facility];  Service: Endoscopy;;   CHOLECYSTECTOMY N/A 03/09/2019   Procedure: laparoscopic cholecystectomy;  Surgeon: Caralyn Chandler, MD;  Location: [facility];  Service: General;  Laterality: N/A;   CYST REMOVAL TRUNK     ERCP N/A 03/08/2019   Procedure: ENDOSCOPIC RETROGRADE CHOLANGIOPANCREATOGRAPHY (ERCP);  Surgeon: Kenney Peacemaker, MD;  Location: [facility];  Service: Endoscopy;  Laterality: N/A;   ESOPHAGOGASTRODUODENOSCOPY (EGD) WITH PROPOFOL  N/A 04/24/2019   Procedure: ESOPHAGOGASTRODUODENOSCOPY (EGD) WITH PROPOFOL ;  Surgeon: Genell Ken, MD;  Location: [facility];  Service:  Gastroenterology;  Laterality: N/A;   HEMOSTASIS CLIP PLACEMENT  04/24/2019   Procedure: HEMOSTASIS CLIP PLACEMENT;  Surgeon: Genell Ken, MD;  Location: [facility];  Service: Gastroenterology;;   LAPAROSCOPIC NISSEN FUNDOPLICATION N/A 06/13/2021   Procedure: LAPAROSCOPIC HIATAL HERNIA REPAIR;  Surgeon: Junie Olds, MD;  Location: [facility];  Service: General;  Laterality: N/A;   PANCREATIC STENT PLACEMENT  03/08/2019   Procedure: PANCREATIC STENT PLACEMENT;  Surgeon: Kenney Peacemaker, MD;  Location: [facility];  Service: Endoscopy;;   POLYPECTOMY  04/24/2019   Procedure: POLYPECTOMY;  Surgeon: Genell Ken, MD;  Location: [facility];  Service: Gastroenterology;;   REMOVAL OF STONES  03/08/2019   Procedure: REMOVAL OF STONES;  Surgeon: Kenney Peacemaker, MD;  Location: [facility];  Service: Endoscopy;;   SPHINCTEROTOMY  03/08/2019   Procedure: [address];  Surgeon: Kenney Peacemaker, MD;  Location: [facility];  Service: Endoscopy;;   UPPER GI ENDOSCOPY  06/13/2021   Procedure: UPPER GI ENDOSCOPY;  Surgeon: Junie Olds, MD;  Location: [facility];  Service: General;;    Family History  Problem Relation Age of Onset   Memory loss Mother    COPD Father      Social Connections: Not on file      Current Outpatient Medications:    albuterol  (VENTOLIN  HFA) 108 (90 Base) MCG/ACT inhaler, TAKE 2 PUFFS BY MOUTH EVERY 6 HOURS AS NEEDED FOR WHEEZE OR SHORTNESS OF BREATH, Disp: 18 each, Rfl: 0   aspirin  81 MG chewable tablet, Chew 1 tablet (81 mg total) by mouth daily., Disp: 90 tablet, Rfl: 1   blood glucose meter kit and supplies, Testing at least once per day (morning fasting), with option to test up to 4 times per day (morning fasting and as needed for symptoms) as needed for better control. Use only the strips that come in this kit with this meter- other strips will not work properly., Disp: 1 each, Rfl: 99   Blood Glucose Monitoring Suppl (ACCU-CHEK GUIDE ME) w/Device KIT, 1 EACH  BY DOES NOT APPLY ROUTE IN THE MORNING, AT NOON, AND AT BEDTIME. MAY SUBSTITUTE TO ANY MANUFACTURER COVERED BY PATIENT'S INSURANCE., Disp: 1 kit, Rfl: 0   calcium  carbonate (OSCAL) 1500 (600 Ca) MG TABS tablet, Take 0.4 tablets (600 mg total) by mouth daily., Disp: 90 tablet, Rfl: 1   fluticasone  (FLONASE ) 50 MCG/ACT nasal spray, SPRAY 2 SPRAYS INTO EACH NOSTRIL EVERY DAY, Disp: 48 mL, Rfl: 0   glipiZIDE  (GLUCOTROL ) 5 MG tablet, Take 2 tablets (10 mg total) by mouth at bedtime., Disp: 180 tablet, Rfl: 3   insulin  glargine, 2 Unit Dial, (TOUJEO  MAX SOLOSTAR) 300 UNIT/ML Solostar Pen, Inject 14 Units into the skin at bedtime. Increase by 2 units every 4 days if AM fasting blood sugar is higher than 120. Goal blood sugar 100-120., Disp: , Rfl:    Insulin  Pen Needle (COMFORT EZ MICRO PEN NEEDLES) 32G X 4 MM MISC, 1 Needle by Does not apply route daily., Disp: 100 each, Rfl: 1   lisinopril  (ZESTRIL ) 20 MG tablet, Take 1 tablet (20 mg total) by mouth daily., Disp:  90 tablet, Rfl: 3   loratadine  (CLARITIN ) 10 MG tablet, Take 1 tablet (10 mg total) by mouth daily., Disp: 90 tablet, Rfl: 3   meloxicam  (MOBIC ) 7.5 MG tablet, TAKE 1 TABLET BY MOUTH EVERY DAY, Disp: 90 tablet, Rfl: 1   metFORMIN  (GLUCOPHAGE ) 500 MG tablet, Take 1 tablet (500 mg total) by mouth daily with breakfast., Disp: 90 tablet, Rfl: 3   metoprolol  succinate (TOPROL -XL) 25 MG 24 hr tablet, Take 1 tablet (25 mg total) by mouth daily. For headache prevention, Disp: 90 tablet, Rfl: 3   Multiple Vitamin (MULTIVITAMIN WITH MINERALS) TABS tablet, Take 1 tablet by mouth daily. Women's 50+ Multivitamin, Disp: , Rfl:    Omega-3 Fatty Acids (FISH OIL  PO), Take 2,000 mg by mouth daily., Disp: , Rfl:    pantoprazole  (PROTONIX ) 40 MG tablet, Take 1 tablet (40 mg total) by mouth 2 (two) times daily., Disp: 180 tablet, Rfl: 3   PARoxetine  (PAXIL ) 30 MG tablet, Take 1 tablet (30 mg total) by mouth daily., Disp: 90 tablet, Rfl: 3   rizatriptan  (MAXALT ) 10 MG  tablet, Take 1 tablet (10 mg total) by mouth as needed for migraine. May repeat in 2 hours if needed. Do not take more than 2 doses in 24 hours., Disp: 6 tablet, Rfl: 3   rosuvastatin  (CRESTOR ) 20 MG tablet, Take 1 tablet (20 mg total) by mouth daily., Disp: 90 tablet, Rfl: 3   Semaglutide ,0.25 or 0.5MG /DOS, (OZEMPIC , 0.25 OR 0.5 MG/DOSE,) 2 MG/1.5ML SOPN, Inject 0.25 mg as directed once a week for 28 days, THEN 0.5 mg once a week. Pen 1 and 2.., Disp: , Rfl:    Vitamin D , Ergocalciferol , (DRISDOL ) 1.25 MG (50000 UNIT) CAPS capsule, Take 1 capsule (50,000 Units total) by mouth every 7 (seven) days., Disp: 12 capsule, Rfl: 3   azithromycin  (ZITHROMAX ) 250 MG tablet, 2 tablets day 1, then 1 tablet days 2-4 (Patient not taking: Reported on 01/22/2024), Disp: 6 tablet, Rfl: 0   Physical Exam:   BP 123/80   Pulse 95   Ht 5\' 6"  (1.676 m)   Wt 195 lb (88.5 kg)   SpO2 93%   BMI 31.47 kg/m   Pertinent Findings  CN II-XII intact Bilateral EAC clear and TM intact with well pneumatized middle ear spaces Weber 512: equal Rinne 512: AC > BC b/l  Anterior rhinoscopy: Septum midline No lesions of oral cavity/oropharynx; dentition within normal limits No obviously palpable neck masses/lymphadenopathy/thyromegaly No respiratory distress or stridor  Seprately Identifiable Procedures:  None  Impression & Plans:  Kendra Miles is a 73 y.o. 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  No alarming findings.  I encouraged her to use white noise at night if this is significantly bothersome.  Hearing loss-  Patient has normal to severe symmetric sensorineural hearing loss from 12,000 Hz through 8000 Hz.  I do think that she would benefit from hearing aids, I gave her a list of local providers.  I am happy to see her back in the office at any point for any new or worsening signs or symptoms.   - f/u PRN   Thank you for allowing me the opportunity to care for your patient.  Please do not hesitate to contact me should you have any other questions.  Sincerely, Belma Boxer PA-C [facility] Phone: [phone] Fax: [phone]  01/22/2024, 11:20 AM

## 2024-01-22 NOTE — Progress Notes (Signed)
  [address] [place], [place] [address] [phone]  Audiological Evaluation    Name: Kendra Miles     DOB:   1950/08/29      MRN:   [id number]                                                                                     Service Date: 01/22/2024     Accompanied by: unaccompanied   Patient comes today after Lorane Rocker, PA-C sent a referral for a hearing evaluation due to concerns with tinnitus.   Symptoms Yes Details  Hearing loss  [x]  Not much, per patient  Tinnitus  [x]  Longstanding in both ears, sometimes when she yawns now also hears a louder pitched sound  Ear pain/ infections/pressure  []    Balance problems  []    Noise exposure history  []    Previous ear surgeries  []    Family history of hearing loss  []    Amplification  []    Other  []      Otoscopy: Right ear: Clear external ear canals and notable landmarks visualized on the tympanic membrane. Left ear:  Clear external ear canals and notable landmarks visualized on the tympanic membrane.  Tympanometry: Right ear: Type A- Normal external ear canal volume with normal middle ear pressure and tympanic membrane compliance. Left ear: Type A- Normal external ear canal volume with normal middle ear pressure and tympanic membrane compliance.  Pure tone Audiometry: Both ears: Borderline normal to severe sensorineural hearing loss from 125 Hz - 8000 Hz.   Speech Audiometry: Right ear- Speech Reception Threshold (SRT) was obtained at 35 dBHL. Left ear-Speech Reception Threshold (SRT) was obtained at 40 dBHL.   Word Recognition Score Tested using NU-6 (MLV) Right ear: 88% was obtained at a presentation level of 80 dBHL with contralateral masking which is deemed as  good . Left ear: 80% was obtained at a presentation level of 80 dBHL with contralateral masking which is deemed as  good .   The hearing test results were completed under inserts and re-checked with headphones  and results are deemed to be  of good reliability. Test technique:  conventional      Recommendations: Follow up with ENT as scheduled for today. Return for a hearing evaluation if concerns with hearing changes arise or per MD recommendation. Consider a communication needs assessment after medical clearance for hearing aids is obtained.   Kendra Miles, AUD

## 2024-01-23 NOTE — Progress Notes (Unsigned)
 Diabetes Self-Management Education  Visit Type:  Follow-up  Appt. Start Time: 1545 Appt. End Time: 1422  01/30/2024  Kendra Miles, identified by name and date of birth, is a 73 y.o. female with a diagnosis of Diabetes:  .    ASSESSMENT Pt presents today alone with her CGM reader. Pt reports she has purchased glucose tabs and carries at all times. Pt reports she has decreased her sweet tea to once weekly. Pt reports she is aiming to avoid skipping meals and will select a protein bar or protein shake versus skipping a meal. Pt was able select items to balance her plate/intake at meals and snacks with prompting using plate planer and snack sheet. Pt reports she has experienced hypoglycemia mainly at night while sleeping. Pt reports values around 60 with CGM alarming. Pt reports elevations occur due to her craving sweets. RD encouraged balanced meals and snacks and reviewed options with Pt today.  Pt reports eating out 3-4 times weekly. All Pt questions were answered during this enocunter.   CGM Results from download:   % Time CGM active:   96 %   (Goal >70%)  Average glucose:   193 mg/dL for 7 days  Time in range (70-180 mg/dL):   32 %   (Goal >16%)  Time High (>181 mg/dL):   67 %   (Goal < 10%)  Time Low (< 70 mg/dL):   1 %   (Goal <9%)   Lab Results  Component Value Date   HGBA1C 11.0 (H) 12/05/2023   Last vitamin D  Lab Results  Component Value Date   VD25OH 27.4 (L) 08/06/2023   Lab Results  Component Value Date   CHOL 148 12/05/2023   HDL 41 12/05/2023   LDLCALC 70 12/05/2023   LDLDIRECT 176 (H) 05/04/2023   TRIG 222 (H) 12/05/2023   CHOLHDL 3.6 12/05/2023   Wt Readings from Last 3 Encounters:  01/22/24 195 lb (88.5 kg)  12/05/23 194 lb 12.8 oz (88.4 kg)  11/29/23 193 lb 9.6 oz (87.8 kg)   There were no vitals taken for this visit. There is no height or weight on file to calculate BMI.    Diabetes Self-Management Education - 01/30/24 1552       Psychosocial  Assessment   What is the hardest part about your diabetes right now, causing you the most concern, or is the most worrisome to you about your diabetes?   Making healty food and beverage choices    Self-care barriers None    Self-management support Doctor's office    Patient Concerns Nutrition/Meal planning;Problem Solving    Special Needs Other (comment)    Preferred Learning Style No preference indicated    Learning Readiness Change in progress      Pre-Education Assessment   Patient understands the diabetes disease and treatment process. Needs Review    Patient understands incorporating nutritional management into lifestyle. Needs Review    Patient undertands incorporating physical activity into lifestyle. Needs Review    Patient understands using medications safely. Needs Review    Patient understands monitoring blood glucose, interpreting and using results Needs Review    Patient understands prevention, detection, and treatment of acute complications. Needs Review    Patient understands prevention, detection, and treatment of chronic complications. Needs Review    Patient understands how to develop strategies to address psychosocial issues. Needs Review    Patient understands how to develop strategies to promote health/change behavior. Needs Review      Complications  How often do you check your blood sugar? > 4 times/day    Number of hypoglycemic episodes per month 4    Number of hyperglycemic episodes ( >200mg /dL): Daily      Dietary Intake   Breakfast ~11am: skips or protein shake    Lunch one protein bar    Snack (afternoon) small blizzard dairy queen    Warehouse manager, Psychologist, occupational) water, sweet tea once weekly      Activity / Exercise   Activity / Exercise Type Light (walking / raking leaves)      Patient Education   Previous Diabetes Education Yes (please comment)    Disease Pathophysiology Explored patient's options for treatment of their diabetes     Healthy Eating Plate Method;Reviewed blood glucose goals for pre and post meals and how to evaluate the patients' food intake on their blood glucose level.;Role of diet in the treatment of diabetes and the relationship between the three main macronutrients and blood glucose level    Monitoring Identified appropriate SMBG and/or A1C goals.;Taught/evaluated CGM (comment)    Acute complications Taught prevention, symptoms, and  treatment of hypoglycemia - the 15 rule.    Chronic complications Identified and discussed with patient  current chronic complications    Diabetes Stress and Support Identified and addressed patients feelings and concerns about diabetes    Preconception care --   n/a   Lifestyle and Health Coping Lifestyle issues that need to be addressed for better diabetes care      Individualized Goals (developed by patient)   Nutrition General guidelines for healthy choices and portions discussed;Follow meal plan discussed    Medications take my medication as prescribed    Monitoring  Consistenly use CGM    Problem Solving Eating Pattern    Reducing Risk treat hypoglycemia with 15 grams of carbs if blood glucose less than 70mg /dL;do foot checks daily;examine blood glucose patterns    Health Coping Ask for help with psychological, social, or emotional issues      Patient Self-Evaluation of Goals - Patient rates self as meeting previously set goals (% of time)   Nutrition 25 - 50% (sometimes)      Post-Education Assessment   Patient understands the diabetes disease and treatment process. Comprehends key points    Patient understands incorporating nutritional management into lifestyle. Comprehends key points    Patient undertands incorporating physical activity into lifestyle. Comprehends key points    Patient understands using medications safely. Comphrehends key points    Patient understands monitoring blood glucose, interpreting and using results Comprehends key points    Patient  understands prevention, detection, and treatment of acute complications. Comprehends key points    Patient understands prevention, detection, and treatment of chronic complications. Comprehends key points    Patient understands how to develop strategies to address psychosocial issues. Needs Review    Patient understands how to develop strategies to promote health/change behavior. Needs Review      Outcomes   Program Status Not Completed             Learning Objective:  Patient will have a greater understanding of diabetes self-management. Patient education plan is to attend individual and/or group sessions per assessed needs and concerns.   Plan:   Patient Instructions  Aim for balanced at meal and snacks times  Great job decreasing sweet tea intake ! Continue to focus on avoiding skipping meals    Expected Outcomes:  Demonstrated interest in learning but significant barriers to  change  Education material provided: My Plate and Snack sheet  If problems or questions, patient to contact team via:  Phone  Future DSME appointment: - 3-4 monthsPRN

## 2024-01-28 ENCOUNTER — Encounter: Payer: Self-pay | Admitting: Audiology

## 2024-01-30 ENCOUNTER — Encounter: Attending: Nurse Practitioner | Admitting: Dietician

## 2024-01-30 DIAGNOSIS — E1169 Type 2 diabetes mellitus with other specified complication: Secondary | ICD-10-CM | POA: Insufficient documentation

## 2024-01-30 DIAGNOSIS — Z794 Long term (current) use of insulin: Secondary | ICD-10-CM | POA: Insufficient documentation

## 2024-01-30 DIAGNOSIS — E11649 Type 2 diabetes mellitus with hypoglycemia without coma: Secondary | ICD-10-CM | POA: Insufficient documentation

## 2024-01-30 NOTE — Patient Instructions (Signed)
 Aim for balanced at meal and snacks times  Great job decreasing sweet tea intake ! Continue to focus on avoiding skipping meals

## 2024-01-31 ENCOUNTER — Other Ambulatory Visit: Payer: Self-pay | Admitting: Nurse Practitioner

## 2024-01-31 MED ORDER — COMFORT EZ MICRO PEN NEEDLES 32G X 4 MM MISC: 1.0000 | Freq: Every day | 1 refills | Status: DC

## 2024-01-31 NOTE — Telephone Encounter (Signed)
 Copied from CRM [id number]. Topic: Clinical - Medication Refill >> Jan 31, 2024  1:10 PM Zipporah Him wrote: Medication: Insulin  Pen Needle (COMFORT EZ MICRO PEN NEEDLES) 32G X 4 MM MISC  Has the patient contacted their pharmacy? Yes (Agent: If no, request that the patient contact the pharmacy for the refill. If patient does not wish to contact the pharmacy document the reason why and proceed with request.) (Agent: If yes, when and what did the pharmacy advise?)  This is the patient's preferred pharmacy:   [id number] [place], [place] - [address] AT [facility] [address] [facility] [address] & [facility] [address] [place] [address] Phone: [phone] Fax: [phone]  Is this the correct pharmacy for this prescription? Yes If no, delete pharmacy and type the correct one.   Has the prescription been filled recently? Yes  Is the patient out of the medication? No, got about 10 left  Has the patient been seen for an appointment in the last year OR does the patient have an upcoming appointment? Yes  Can we respond through MyChart? Yes  Agent: Please be advised that Rx refills may take up to 3 business days. We ask that you follow-up with your pharmacy.

## 2024-02-04 ENCOUNTER — Telehealth: Payer: Self-pay

## 2024-02-04 ENCOUNTER — Other Ambulatory Visit (HOSPITAL_COMMUNITY): Payer: Self-pay

## 2024-02-04 NOTE — Telephone Encounter (Signed)
 I have received a Prior Authorization Request for the patients Insulin  Pen Needle. However A Prior Authorization is not needed and I have spoke with the pts pharmacy listed on file and gave them the patients Medicare Part B # to fill the Rx.   Nothing Further needed.

## 2024-02-18 ENCOUNTER — Other Ambulatory Visit: Payer: Self-pay | Admitting: Nurse Practitioner

## 2024-02-18 DIAGNOSIS — E1169 Type 2 diabetes mellitus with other specified complication: Secondary | ICD-10-CM

## 2024-02-25 ENCOUNTER — Encounter: Payer: Self-pay | Admitting: Nurse Practitioner

## 2024-02-25 ENCOUNTER — Other Ambulatory Visit: Payer: Self-pay

## 2024-02-25 MED ORDER — COMFORT EZ MICRO PEN NEEDLES 32G X 4 MM MISC: 1.0000 | Freq: Every day | 1 refills | Status: AC

## 2024-02-26 ENCOUNTER — Encounter: Payer: Self-pay | Admitting: Nurse Practitioner

## 2024-02-28 ENCOUNTER — Other Ambulatory Visit (HOSPITAL_COMMUNITY): Payer: Self-pay

## 2024-03-21 ENCOUNTER — Institutional Professional Consult (permissible substitution) (INDEPENDENT_AMBULATORY_CARE_PROVIDER_SITE_OTHER): Admitting: Otolaryngology

## 2024-04-07 NOTE — Progress Notes (Unsigned)
 Shingrix

## 2024-04-08 ENCOUNTER — Ambulatory Visit (INDEPENDENT_AMBULATORY_CARE_PROVIDER_SITE_OTHER): Payer: Medicare Other | Admitting: Nurse Practitioner

## 2024-04-08 ENCOUNTER — Encounter: Payer: Self-pay | Admitting: Nurse Practitioner

## 2024-04-08 VITALS — BP 130/82 | HR 80 | Ht 64.5 in | Wt 191.2 lb

## 2024-04-08 DIAGNOSIS — N3090 Cystitis, unspecified without hematuria: Secondary | ICD-10-CM | POA: Diagnosis not present

## 2024-04-08 DIAGNOSIS — E559 Vitamin D deficiency, unspecified: Secondary | ICD-10-CM

## 2024-04-08 DIAGNOSIS — E1169 Type 2 diabetes mellitus with other specified complication: Secondary | ICD-10-CM | POA: Diagnosis not present

## 2024-04-08 DIAGNOSIS — N951 Menopausal and female climacteric states: Secondary | ICD-10-CM

## 2024-04-08 DIAGNOSIS — Z794 Long term (current) use of insulin: Secondary | ICD-10-CM

## 2024-04-08 DIAGNOSIS — Z6832 Body mass index (BMI) 32.0-32.9, adult: Secondary | ICD-10-CM

## 2024-04-08 DIAGNOSIS — E1159 Type 2 diabetes mellitus with other circulatory complications: Secondary | ICD-10-CM

## 2024-04-08 DIAGNOSIS — K219 Gastro-esophageal reflux disease without esophagitis: Secondary | ICD-10-CM | POA: Diagnosis not present

## 2024-04-08 DIAGNOSIS — I152 Hypertension secondary to endocrine disorders: Secondary | ICD-10-CM

## 2024-04-08 DIAGNOSIS — E041 Nontoxic single thyroid nodule: Secondary | ICD-10-CM

## 2024-04-08 DIAGNOSIS — E785 Hyperlipidemia, unspecified: Secondary | ICD-10-CM

## 2024-04-08 DIAGNOSIS — J3489 Other specified disorders of nose and nasal sinuses: Secondary | ICD-10-CM

## 2024-04-08 LAB — POCT URINALYSIS DIP (CLINITEK)
Bilirubin, UA: NEGATIVE
Blood, UA: NEGATIVE
Glucose, UA: 500 mg/dL — AB
Ketones, POC UA: NEGATIVE mg/dL
Nitrite, UA: NEGATIVE
POC PROTEIN,UA: NEGATIVE
Spec Grav, UA: 1.01 (ref 1.010–1.025)
Urobilinogen, UA: 0.2 U/dL
pH, UA: 6 (ref 5.0–8.0)

## 2024-04-08 MED ORDER — SULFAMETHOXAZOLE-TRIMETHOPRIM 800-160 MG PO TABS: 1.0000 | ORAL_TABLET | Freq: Two times a day (BID) | ORAL | 0 refills | Status: DC

## 2024-04-08 MED ORDER — PAROXETINE HCL 30 MG PO TABS: 30.0000 mg | ORAL_TABLET | Freq: Every day | ORAL | 3 refills | Status: AC

## 2024-04-08 MED ORDER — OZEMPIC (0.25 OR 0.5 MG/DOSE) 2 MG/1.5ML ~~LOC~~ SOPN: PEN_INJECTOR | SUBCUTANEOUS | Status: DC

## 2024-04-08 MED ORDER — TOUJEO MAX SOLOSTAR 300 UNIT/ML ~~LOC~~ SOPN: 20.0000 [IU] | PEN_INJECTOR | Freq: Every day | SUBCUTANEOUS | Status: DC

## 2024-04-08 MED ORDER — PANTOPRAZOLE SODIUM 40 MG PO TBEC: 40.0000 mg | DELAYED_RELEASE_TABLET | Freq: Two times a day (BID) | ORAL | 3 refills | Status: AC

## 2024-04-08 NOTE — Assessment & Plan Note (Signed)
Repeat labs for monitoring.

## 2024-04-08 NOTE — Assessment & Plan Note (Signed)
 Blood pressure slightly elevated today. No regular home monitoring reported. Encouraged home blood pressure monitoring to get an idea of where blood pressure is staying in the home environment.  Consider medication adjustment based on reported home readings.

## 2024-04-08 NOTE — Assessment & Plan Note (Signed)
 Blood sugars have been elevated, around 250 mg/dL, since discontinuing Toujeo  in May. Blood sugars were better controlled with Toujeo  and Ozempic , with reports of 2 instances of hypoglycemia down to 69. Insurance issues with obtaining needles for Toujeo  and Ozempic  persist. She has been receiving samples of Toujeo , but ran out a couple of weeks ago. Patient assistance program for Ozempic  was established with approval for 2025, but she has not received her 2mg  doses moving forward and has completed her last dose on hand.  Will provide additional sample of Toujeo  and pen needles until we determine if we can continue this treatment or a change is needed. Will work with pharmacy to help better understand this.  Sample of 2mg  Ozempic  provided until we determine the breakdown in PAP program.  - Administer Toujeo  20 units once daily. Adjust by decreasing 2 units if experiencing hypoglycemia. - Administer Ozempic  2 mg once weekly. - Contact pharmacist Jon to resolve patient assistance issues for Ozempic  and Toujeo  needles. - Ensure all medications are sent to [facility].

## 2024-04-08 NOTE — Assessment & Plan Note (Signed)
 Nasal pain persists since a fall in May, exacerbated by new glasses. Differential includes irritation from glasses or unresolved nasal injury. - Recommend evaluation by an eye doctor to assess glasses fit and pressure on the nose. - Consider ENT referral if nasal pain persists after glasses adjustment.

## 2024-04-08 NOTE — Assessment & Plan Note (Signed)
 Initially started to help with menopause symptoms including mood changes. Mood appears well-controlled with paroxetine  with no concerns present at this time. Do not feel that cessation of the medication is warranted given stability and control.  - Continue paroxetine .

## 2024-04-08 NOTE — Patient Instructions (Addendum)
 I will reach out to [facility], our pharmacist, to see if she can help us  get an idea of why the patient assistance hasn't come recently and if we can get coverage for the needles and Toujeo .   Your goal from today is to get more exercise this next year.  I recommend you work towards getting in a walk every day, outside of work. Start with about a 10 minute walk and increase time and distance as you can tolerate it.  This will help your blood pressure, blood sugar, and overall health.   Check with your eye doctor to make sure your glasses are sitting correctly on your nose and are not the cause of the problems. If this is normal, let me know and I will send a referral to the ENT.   Start the Toujeo  at 20 units once a day. If you have low readings come down by two units for every day that it is less than 110

## 2024-04-08 NOTE — Assessment & Plan Note (Signed)
 Currently managed with pantoprazole  BID with good control. Consider tapering down daily dosing to help reduce risk of bone loss with prolonged use.

## 2024-04-08 NOTE — Assessment & Plan Note (Signed)
 Recommend diet, exercise, and medication management to aid in weight loss for overall health. Continue Ozempic  for control of blood sugars and hopefully we will see benefit with weight management.

## 2024-04-08 NOTE — Assessment & Plan Note (Signed)
 Evaluate thyroid  labs today.

## 2024-04-08 NOTE — Assessment & Plan Note (Signed)
 Ongoing management with rosuvastatin . No symptoms at this time.

## 2024-04-08 NOTE — Assessment & Plan Note (Signed)
 UA positive for leukocytes with symptoms present for 8 days with no improvement. Will send urine for culture and go ahead and start empiric antibiotics today for management. Will change if culture shows different treatment is appropriate.

## 2024-04-09 LAB — CBC WITH DIFFERENTIAL/PLATELET
Basophils Absolute: 0.1 x10E3/uL (ref 0.0–0.2)
Basos: 1 %
EOS (ABSOLUTE): 0.2 x10E3/uL (ref 0.0–0.4)
Eos: 3 %
Hematocrit: 49.4 % — ABNORMAL HIGH (ref 34.0–46.6)
Hemoglobin: 16 g/dL — ABNORMAL HIGH (ref 11.1–15.9)
Immature Grans (Abs): 0 x10E3/uL (ref 0.0–0.1)
Immature Granulocytes: 0 %
Lymphocytes Absolute: 2.1 x10E3/uL (ref 0.7–3.1)
Lymphs: 26 %
MCH: 29.5 pg (ref 26.6–33.0)
MCHC: 32.4 g/dL (ref 31.5–35.7)
MCV: 91 fL (ref 79–97)
Monocytes Absolute: 0.5 x10E3/uL (ref 0.1–0.9)
Monocytes: 6 %
Neutrophils Absolute: 5.1 x10E3/uL (ref 1.4–7.0)
Neutrophils: 64 %
Platelets: 334 x10E3/uL (ref 150–450)
RBC: 5.43 x10E6/uL — ABNORMAL HIGH (ref 3.77–5.28)
RDW: 13.4 % (ref 11.7–15.4)
WBC: 7.9 x10E3/uL (ref 3.4–10.8)

## 2024-04-09 LAB — CMP14+EGFR
ALT: 19 IU/L (ref 0–32)
AST: 18 IU/L (ref 0–40)
Albumin: 4.2 g/dL (ref 3.8–4.8)
Alkaline Phosphatase: 105 IU/L (ref 44–121)
BUN/Creatinine Ratio: 23 (ref 12–28)
BUN: 22 mg/dL (ref 8–27)
Bilirubin Total: 0.6 mg/dL (ref 0.0–1.2)
CO2: 24 mmol/L (ref 20–29)
Calcium: 9.7 mg/dL (ref 8.7–10.3)
Chloride: 103 mmol/L (ref 96–106)
Creatinine, Ser: 0.96 mg/dL (ref 0.57–1.00)
Globulin, Total: 2.1 g/dL (ref 1.5–4.5)
Glucose: 160 mg/dL — ABNORMAL HIGH (ref 70–99)
Potassium: 4.5 mmol/L (ref 3.5–5.2)
Sodium: 143 mmol/L (ref 134–144)
Total Protein: 6.3 g/dL (ref 6.0–8.5)
eGFR: 63 mL/min/1.73 (ref >59)

## 2024-04-09 LAB — LIPID PANEL
Chol/HDL Ratio: 3.3 ratio (ref 0.0–4.4)
Cholesterol, Total: 127 mg/dL (ref 100–199)
HDL: 38 mg/dL — ABNORMAL LOW (ref >39)
LDL Chol Calc (NIH): 62 mg/dL (ref 0–99)
Triglycerides: 155 mg/dL — ABNORMAL HIGH (ref 0–149)
VLDL Cholesterol Cal: 27 mg/dL (ref 5–40)

## 2024-04-09 LAB — MICROALBUMIN / CREATININE URINE RATIO
Creatinine, Urine: 91.1 mg/dL
Microalb/Creat Ratio: 16 mg/g{creat} (ref 0–29)
Microalbumin, Urine: 14.9 ug/mL

## 2024-04-09 LAB — URINE CULTURE

## 2024-04-09 LAB — VITAMIN D 25 HYDROXY (VIT D DEFICIENCY, FRACTURES): Vit D, 25-Hydroxy: 47.8 ng/mL (ref 30.0–100.0)

## 2024-04-09 LAB — HEMOGLOBIN A1C
Est. average glucose Bld gHb Est-mCnc: 180 mg/dL
Hgb A1c MFr Bld: 7.9 % — ABNORMAL HIGH (ref 4.8–5.6)

## 2024-04-09 LAB — TSH: TSH: 2.09 u[IU]/mL (ref 0.450–4.500)

## 2024-04-15 ENCOUNTER — Ambulatory Visit: Payer: Self-pay | Admitting: Nurse Practitioner

## 2024-04-15 DIAGNOSIS — D751 Secondary polycythemia: Secondary | ICD-10-CM

## 2024-04-22 ENCOUNTER — Other Ambulatory Visit: Payer: Self-pay | Admitting: Nurse Practitioner

## 2024-04-22 DIAGNOSIS — E1169 Type 2 diabetes mellitus with other specified complication: Secondary | ICD-10-CM

## 2024-05-09 ENCOUNTER — Other Ambulatory Visit

## 2024-05-09 ENCOUNTER — Other Ambulatory Visit: Payer: Self-pay | Admitting: Medical

## 2024-05-09 ENCOUNTER — Telehealth: Payer: Self-pay

## 2024-05-09 DIAGNOSIS — D751 Secondary polycythemia: Secondary | ICD-10-CM

## 2024-05-09 DIAGNOSIS — E1169 Type 2 diabetes mellitus with other specified complication: Secondary | ICD-10-CM

## 2024-05-09 LAB — CBC WITH DIFFERENTIAL/PLATELET
Basophils Absolute: 0.1 x10E3/uL (ref 0.0–0.2)
Basos: 1 %
EOS (ABSOLUTE): 0.6 x10E3/uL — ABNORMAL HIGH (ref 0.0–0.4)
Eos: 7 %
Hematocrit: 48.5 % — ABNORMAL HIGH (ref 34.0–46.6)
Hemoglobin: 15.4 g/dL (ref 11.1–15.9)
Immature Grans (Abs): 0 x10E3/uL (ref 0.0–0.1)
Immature Granulocytes: 0 %
Lymphocytes Absolute: 2.2 x10E3/uL (ref 0.7–3.1)
Lymphs: 24 %
MCH: 29.7 pg (ref 26.6–33.0)
MCHC: 31.8 g/dL (ref 31.5–35.7)
MCV: 94 fL (ref 79–97)
Monocytes Absolute: 0.6 x10E3/uL (ref 0.1–0.9)
Monocytes: 6 %
Neutrophils Absolute: 5.7 x10E3/uL (ref 1.4–7.0)
Neutrophils: 62 %
Platelets: 273 x10E3/uL (ref 150–450)
RBC: 5.18 x10E6/uL (ref 3.77–5.28)
RDW: 13.2 % (ref 11.7–15.4)
WBC: 9.2 x10E3/uL (ref 3.4–10.8)

## 2024-05-09 MED ORDER — METFORMIN HCL 500 MG PO TABS: 500.0000 mg | ORAL_TABLET | Freq: Two times a day (BID) | ORAL | 0 refills | Status: DC

## 2024-05-09 NOTE — Telephone Encounter (Signed)
 Received a fax from [facility], Patient is telling them that Metformin  should now be twice daily. I do not see in Camie Hastings notes indicating a change to twice daily.      I sent to Ludie Gent on 05/09/24  and he changed dosing to 2x a day. Sending to you for an FYI

## 2024-05-09 NOTE — Telephone Encounter (Signed)
 Received a fax from [facility], Patient is telling them that Metformin  should now be twice daily. I do not see in Camie Hastings notes indicating a change to twice daily.

## 2024-05-09 NOTE — Telephone Encounter (Signed)
 Called Pt and let her know that you changed dosing to 2 x a day, Will forward to Camie Hun for [place]. Thank you

## 2024-05-15 ENCOUNTER — Other Ambulatory Visit

## 2024-05-19 ENCOUNTER — Ambulatory Visit: Payer: Self-pay | Admitting: Nurse Practitioner

## 2024-05-22 ENCOUNTER — Other Ambulatory Visit: Payer: Self-pay | Admitting: Nurse Practitioner

## 2024-05-22 NOTE — Telephone Encounter (Signed)
 Last appt 04/08/24.

## 2024-06-06 ENCOUNTER — Other Ambulatory Visit: Payer: Self-pay | Admitting: Nurse Practitioner

## 2024-06-06 DIAGNOSIS — E559 Vitamin D deficiency, unspecified: Secondary | ICD-10-CM

## 2024-06-09 ENCOUNTER — Encounter: Payer: Self-pay | Admitting: Nurse Practitioner

## 2024-06-09 ENCOUNTER — Other Ambulatory Visit: Payer: Self-pay

## 2024-06-09 MED ORDER — ALBUTEROL SULFATE HFA 108 (90 BASE) MCG/ACT IN AERS: 1.0000 | INHALATION_SPRAY | Freq: Four times a day (QID) | RESPIRATORY_TRACT | 1 refills | Status: AC | PRN

## 2024-06-09 NOTE — Telephone Encounter (Signed)
 Last apt 04/08/24 Vit D was normal then not sure if you want pt to remain on this.

## 2024-06-18 ENCOUNTER — Encounter: Payer: Self-pay | Admitting: Nurse Practitioner

## 2024-06-20 ENCOUNTER — Telehealth: Payer: Self-pay

## 2024-06-20 NOTE — Progress Notes (Signed)
   06/20/2024  Patient ID: Kendra Miles, female   DOB: 1951/06/12, 73 y.o.   MRN: [id number]  Attempted to contact patient to discuss the end of the Ozempic  PAP program and next steps. Left HIPAA compliant message for patient to return my call at their convenience.   Jon VEAR Lindau, PharmD Clinical Pharmacist [phone]

## 2024-07-02 ENCOUNTER — Ambulatory Visit (INDEPENDENT_AMBULATORY_CARE_PROVIDER_SITE_OTHER): Admitting: Medical

## 2024-07-02 ENCOUNTER — Ambulatory Visit: Payer: Self-pay

## 2024-07-02 VITALS — BP 120/70 | HR 91 | Temp 97.4°F | Wt 188.0 lb

## 2024-07-02 DIAGNOSIS — Z794 Long term (current) use of insulin: Secondary | ICD-10-CM

## 2024-07-02 DIAGNOSIS — R300 Dysuria: Secondary | ICD-10-CM | POA: Diagnosis not present

## 2024-07-02 DIAGNOSIS — R350 Frequency of micturition: Secondary | ICD-10-CM | POA: Diagnosis not present

## 2024-07-02 DIAGNOSIS — E1169 Type 2 diabetes mellitus with other specified complication: Secondary | ICD-10-CM

## 2024-07-02 DIAGNOSIS — R3915 Urgency of urination: Secondary | ICD-10-CM | POA: Diagnosis not present

## 2024-07-02 LAB — POCT URINALYSIS DIP (PROADVANTAGE DEVICE)
Bilirubin, UA: NEGATIVE
Glucose, UA: 500 mg/dL — AB
Ketones, POC UA: NEGATIVE mg/dL
Nitrite, UA: NEGATIVE
Protein Ur, POC: 100 mg/dL — AB
Specific Gravity, Urine: 1.025
Urobilinogen, Ur: NEGATIVE
pH, UA: 6 (ref 5.0–8.0)

## 2024-07-02 MED ORDER — SULFAMETHOXAZOLE-TRIMETHOPRIM 800-160 MG PO TABS: 1.0000 | ORAL_TABLET | Freq: Two times a day (BID) | ORAL | 0 refills | Status: DC

## 2024-07-02 NOTE — Progress Notes (Signed)
 Subjective:  Kendra Miles is a 73 y.o. female who presents for Chief Complaint  Patient presents with   Acute Visit    Urinary frequency, started Monday, urge to go again but can't go   Kendra Miles is a 73 year old female with diabetes who presents with urinary symptoms.  She has been experiencing urinary symptoms for the past two days, characterized by an urgency to urinate immediately after voiding, often with no urine output. There is no burning sensation, hematuria, or fever. She notes a stronger odor and more yellow color in her urine than usual.  She denies any recent changes in soaps or detergents, travel, or reduced water intake. No nausea, vomiting, or chills are reported. She experiences regular back pain, which she does not associate with her current symptoms, and occasional abdominal pain, which she does not believe is related to her urinary symptoms. No vaginal discharge is present.  Her past medical history includes diabetes and a known allergy to Cipro , Mucinex, and penicillin. She has had two urinary tract infections this year, with the last one occurring in August. A urine culture from August showed minimal bacterial growth.  No other aggravating or relieving factors.    No other c/o.  Past Medical History:  Diagnosis Date   Acid reflux    Arthritis    Back pain 11/02/2020   Calcium  deficiency 11/02/2020   Choledocholithiasis with obstruction 03/06/2019   Cholelithiasis with choledocholithiasis 02/2019   Contusion of face 02/17/2022   Diabetes (HCC)    Emesis, persistent 06/10/2021   Encounter to establish care with new doctor 11/02/2020   Fall 05/06/2021   Flatulence, eructation and gas pain 11/03/2020   Gastric dilation 06/10/2021   Headache    Hiatal hernia    Hyperlipidemia    Lower extremity edema 11/02/2020   Menopausal symptoms 11/02/2020   Otitis media 08/30/2021   Pain of right hip 05/11/2023   Pneumonia    Polyp of stomach and duodenum     Pyelonephritis of right kidney 04/10/2023   Upper GI bleed 02/03/2021   Viral URI 08/23/2021   Current Outpatient Medications on File Prior to Visit  Medication Sig Dispense Refill   albuterol  (VENTOLIN  HFA) 108 (90 Base) MCG/ACT inhaler Inhale 1-2 puffs into the lungs every 6 (six) hours as needed for wheezing or shortness of breath. 18 each 1   aspirin  81 MG chewable tablet Chew 1 tablet (81 mg total) by mouth daily. 90 tablet 1   calcium  carbonate (OSCAL) 1500 (600 Ca) MG TABS tablet Take 0.4 tablets (600 mg total) by mouth daily. 90 tablet 1   fluticasone  (FLONASE ) 50 MCG/ACT nasal spray SPRAY 2 SPRAYS INTO EACH NOSTRIL EVERY DAY 48 mL 0   glipiZIDE  (GLUCOTROL ) 5 MG tablet Take 2 tablets (10 mg total) by mouth at bedtime. 180 tablet 3   insulin  glargine, 2 Unit Dial, (TOUJEO  MAX SOLOSTAR) 300 UNIT/ML Solostar Pen Inject 20 Units into the skin at bedtime. Increase by 2 units every 4 days if AM fasting blood sugar is higher than 120. Goal blood sugar 100-120.     lisinopril  (ZESTRIL ) 20 MG tablet Take 1 tablet (20 mg total) by mouth daily. 90 tablet 3   loratadine  (CLARITIN ) 10 MG tablet Take 1 tablet (10 mg total) by mouth daily. 90 tablet 3   meloxicam  (MOBIC ) 7.5 MG tablet TAKE 1 TABLET BY MOUTH EVERY DAY 90 tablet 1   metFORMIN  (GLUCOPHAGE ) 500 MG tablet Take 1 tablet (500 mg total)  by mouth 2 (two) times daily with a meal. 180 tablet 0   metoprolol  succinate (TOPROL -XL) 25 MG 24 hr tablet Take 1 tablet (25 mg total) by mouth daily. For headache prevention 90 tablet 3   Multiple Vitamin (MULTIVITAMIN WITH MINERALS) TABS tablet Take 1 tablet by mouth daily. Women's 50+ Multivitamin     Omega-3 Fatty Acids (FISH OIL  PO) Take 2,000 mg by mouth daily.     pantoprazole  (PROTONIX ) 40 MG tablet Take 1 tablet (40 mg total) by mouth 2 (two) times daily. 180 tablet 3   PARoxetine  (PAXIL ) 30 MG tablet Take 1 tablet (30 mg total) by mouth daily. 90 tablet 3   rizatriptan  (MAXALT ) 10 MG tablet Take 1  tablet (10 mg total) by mouth as needed for migraine. May repeat in 2 hours if needed. Do not take more than 2 doses in 24 hours. 6 tablet 3   rosuvastatin  (CRESTOR ) 20 MG tablet Take 1 tablet (20 mg total) by mouth daily. 90 tablet 3   Semaglutide ,0.25 or 0.5MG /DOS, (OZEMPIC , 0.25 OR 0.5 MG/DOSE,) 2 MG/1.5ML SOPN On 2mg  dose with patient assistance. Sample provided for 2mg  dose x 2 months 04/08/2024 as she ran out.     Vitamin D , Ergocalciferol , (DRISDOL ) 1.25 MG (50000 UNIT) CAPS capsule TAKE 1 CAPSULE BY MOUTH EVERY 7 DAYS 12 capsule 3   blood glucose meter kit and supplies Testing at least once per day (morning fasting), with option to test up to 4 times per day (morning fasting and as needed for symptoms) as needed for better control. Use only the strips that come in this kit with this meter- other strips will not work properly. 1 each 99   Blood Glucose Monitoring Suppl (ACCU-CHEK GUIDE ME) w/Device KIT 1 EACH BY DOES NOT APPLY ROUTE IN THE MORNING, AT NOON, AND AT BEDTIME. MAY SUBSTITUTE TO ANY MANUFACTURER COVERED BY PATIENT'S INSURANCE. 1 kit 0   Insulin  Pen Needle (COMFORT EZ MICRO PEN NEEDLES) 32G X 4 MM MISC 1 Needle by Does not apply route daily. 100 each 1   No current facility-administered medications on file prior to visit.     The following portions of the patient's history were reviewed and updated as appropriate: allergies, current medications, past family history, past medical history, past social history, past surgical history and problem list.  ROS Otherwise as in subjective above  Objective: BP 120/70   Pulse 91   Temp (!) 97.4 F (36.3 C)   Wt 188 lb (85.3 kg)   BMI 31.77 kg/m   General appearance: alert, no distress, well developed, well nourished Abdomen: +bs, soft, mild upper abdominal tendnerss, otherwise non tender, non distended, no masses, no hepatomegaly, no splenomegaly No CVA tenderness    Assessment: Encounter Diagnoses  Name Primary?   Urine  frequency Yes   Dysuria    Urinary urgency    Type 2 diabetes mellitus with other specified complication, with long-term current use of insulin  (HCC)      Plan: We discussed symptoms and concerns and possible differential which could include symptoms related to glucosuria, urinary tract infection, atrophic vaginitis changes, irritant from soap  Begin Bactrim  antibiotic empirically.  Await culture.  Increase water intake  Discussed importance of good blood sugar control and advised to be disciplined on her medications.  She unfortunately missed a dose of her Toujeo  last night   Ronnetta was seen today for acute visit.  Diagnoses and all orders for this visit:  Urine frequency -  POCT Urinalysis DIP (Proadvantage Device) -     Urine Culture  Dysuria  Urinary urgency  Type 2 diabetes mellitus with other specified complication, with long-term current use of insulin  (HCC)  Other orders -     sulfamethoxazole -trimethoprim  (BACTRIM  DS) 800-160 MG tablet; Take 1 tablet by mouth 2 (two) times daily.    Follow up: pending culture

## 2024-07-02 NOTE — Telephone Encounter (Signed)
 FYI Only or Action Required?: FYI only for provider: appointment scheduled on 07/02/2024.  Patient was last seen in primary care on 04/08/2024 by Early, Camie BRAVO, NP.  Called Nurse Triage reporting Urinary Frequency.  Symptoms began several days ago.  Interventions attempted: Nothing.  Symptoms are: gradually worsening.  Triage Disposition: See Physician Within 24 Hours  Patient/caregiver understands and will follow disposition?: Yes       Copied from CRM [id number]. Topic: Clinical - Red Word Triage >> Jul 02, 2024  8:12 AM Antwanette L wrote: Red Word that prompted transfer to Nurse Triage: Possible UTI.The patient reports experiencing frequent urination and abdominal pain since November 10th. Reason for Disposition  Urinating more frequently than usual (i.e., frequency) OR new-onset of the feeling of an urgent need to urinate (i.e., urgency)  Answer Assessment - Initial Assessment Questions 1. SYMPTOM: What's the main symptom you're concerned about? (e.g., frequency, incontinence)     Frequency  2. ONSET: When did the  frequency  start?     2 days ago  3. PAIN: Is there any pain? If Yes, ask: How bad is it? (Scale: 1-10; mild, moderate, severe)     Mild  4. CAUSE: What do you think is causing the symptoms?     UTI  5. OTHER SYMPTOMS: Do you have any other symptoms? (e.g., blood in urine, fever, flank pain, pain with urination)     Abdominal pain  Protocols used: Urinary Symptoms-A-AH

## 2024-07-06 ENCOUNTER — Ambulatory Visit: Payer: Self-pay | Admitting: Medical

## 2024-07-06 NOTE — Progress Notes (Signed)
 Results through MyChart

## 2024-07-07 LAB — URINE CULTURE

## 2024-07-07 NOTE — Progress Notes (Signed)
 Results thru my chart

## 2024-07-14 ENCOUNTER — Ambulatory Visit (INDEPENDENT_AMBULATORY_CARE_PROVIDER_SITE_OTHER): Payer: Self-pay | Admitting: Nurse Practitioner

## 2024-07-14 ENCOUNTER — Encounter: Payer: Self-pay | Admitting: Nurse Practitioner

## 2024-07-14 VITALS — BP 128/82 | HR 85 | Wt 183.6 lb

## 2024-07-14 DIAGNOSIS — R300 Dysuria: Secondary | ICD-10-CM | POA: Diagnosis not present

## 2024-07-14 DIAGNOSIS — Z794 Long term (current) use of insulin: Secondary | ICD-10-CM

## 2024-07-14 DIAGNOSIS — Z23 Encounter for immunization: Secondary | ICD-10-CM

## 2024-07-14 DIAGNOSIS — R519 Headache, unspecified: Secondary | ICD-10-CM

## 2024-07-14 DIAGNOSIS — E1159 Type 2 diabetes mellitus with other circulatory complications: Secondary | ICD-10-CM

## 2024-07-14 DIAGNOSIS — R7989 Other specified abnormal findings of blood chemistry: Secondary | ICD-10-CM

## 2024-07-14 DIAGNOSIS — N3090 Cystitis, unspecified without hematuria: Secondary | ICD-10-CM

## 2024-07-14 DIAGNOSIS — Z6832 Body mass index (BMI) 32.0-32.9, adult: Secondary | ICD-10-CM | POA: Diagnosis not present

## 2024-07-14 DIAGNOSIS — I152 Hypertension secondary to endocrine disorders: Secondary | ICD-10-CM

## 2024-07-14 DIAGNOSIS — E1169 Type 2 diabetes mellitus with other specified complication: Secondary | ICD-10-CM | POA: Diagnosis not present

## 2024-07-14 DIAGNOSIS — E785 Hyperlipidemia, unspecified: Secondary | ICD-10-CM

## 2024-07-14 DIAGNOSIS — R1010 Upper abdominal pain, unspecified: Secondary | ICD-10-CM

## 2024-07-14 MED ORDER — METOPROLOL SUCCINATE ER 25 MG PO TB24: 25.0000 mg | ORAL_TABLET | Freq: Every day | ORAL | 3 refills | Status: AC

## 2024-07-14 MED ORDER — GLIPIZIDE 5 MG PO TABS: 10.0000 mg | ORAL_TABLET | Freq: Every day | ORAL | 3 refills | Status: DC

## 2024-07-14 MED ORDER — ROSUVASTATIN CALCIUM 20 MG PO TABS: 20.0000 mg | ORAL_TABLET | Freq: Every day | ORAL | 3 refills | Status: AC

## 2024-07-14 MED ORDER — NITROFURANTOIN MONOHYD MACRO 100 MG PO CAPS: 100.0000 mg | ORAL_CAPSULE | Freq: Two times a day (BID) | ORAL | 0 refills | Status: DC

## 2024-07-14 MED ORDER — TOUJEO MAX SOLOSTAR 300 UNIT/ML ~~LOC~~ SOPN: 20.0000 [IU] | PEN_INJECTOR | Freq: Every day | SUBCUTANEOUS | Status: AC

## 2024-07-14 NOTE — Progress Notes (Signed)
 Catheline Doing, DNP, AGNP-c [facility]  [address] [place], [place] [address] [phone]  ESTABLISHED PATIENT- Chronic Health and/or Follow-Up Visit on 07/14/2024  Blood pressure 128/82, pulse 85, weight 183 lb 9.6 oz (83.3 kg).   HPI:  History of Present Illness Kendra Miles is a 73 year old female with a history of urinary tract infections who presents with persistent urinary frequency.  She experiences persistent urinary frequency, urinating as often as every hour, which began after a previous visit two weeks ago. She completed a seven-day course of Bactrim  twice daily, which initially improved her symptoms but did not resolve them completely. She is unsure if the symptoms are due to a bladder infection. She has a known allergy to Cipro  and penicillin. She recalls having a UTI and kidney infection over a year ago, which was treated before a trip to [place] .  She has a history of diabetes and has adjusted her Toujeo  insulin  dose to 12 units once daily due to previous episodes of nocturnal hypoglycemia. Her blood sugars have been mostly stable, with occasional high readings after meals. She has not experienced any recent hypoglycemic episodes since adjusting her dose. She also takes glipizide , two tablets at night, and is on Ozempic , with her current dose being 2 mg.  She experiences persistent pain around her upper abdomen, which started about a month ago. The pain is present throughout the day and is accompanied by occasional headaches and nausea. The pain is more noticeable when sitting or standing and does not seem to be related to her Ozempic  injections. She has a history of a hernia repair and is unsure if the pain is related to this. No chest pain, palpitations, or fever, but she reports shortness of breath with exertion.  She continues to take pantoprazole  for reflux, but still experiences symptoms. She has not seen her GI doctor, Dr. Saintclair, in a long  time.  All ROS negative with exception of what is listed above.    PHYSICAL EXAM Physical Exam Vitals and nursing note reviewed.  Constitutional:      General: She is not in acute distress.    Appearance: Normal appearance. She is not ill-appearing.  HENT:     Head: Normocephalic.  Cardiovascular:     Rate and Rhythm: Normal rate and regular rhythm.     Pulses: Normal pulses.     Heart sounds: Normal heart sounds.  Pulmonary:     Breath sounds: Normal breath sounds.  Abdominal:     General: There is no distension.     Palpations: Abdomen is soft.     Tenderness: There is no abdominal tenderness. There is no right CVA tenderness, left CVA tenderness or guarding.  Musculoskeletal:     Right lower leg: No edema.     Left lower leg: No edema.  Skin:    General: Skin is warm and dry.     Capillary Refill: Capillary refill takes less than 2 seconds.  Neurological:     Mental Status: She is alert and oriented to person, place, and time.     Sensory: No sensory deficit.  Psychiatric:        Mood and Affect: Mood normal.     PLAN Problem List Items Addressed This Visit     Hypertension associated with diabetes (HCC)   Associated with uncontrolled DM. Blood pressue controlled today. No regular home monitoring reported. Encouraged home blood pressure monitoring to get an idea of where blood pressure is staying in the  home environment.  Consider medication adjustment based on reported home readings.       Relevant Medications   insulin  glargine, 2 Unit Dial, (TOUJEO  MAX SOLOSTAR) 300 UNIT/ML Solostar Pen   glipiZIDE  (GLUCOTROL ) 5 MG tablet   metoprolol  succinate (TOPROL -XL) 25 MG 24 hr tablet   rosuvastatin  (CRESTOR ) 20 MG tablet   Other Relevant Orders   Hemoglobin A1c (Completed)   CBC with Differential/Platelet (Completed)   Comprehensive metabolic panel with GFR (Completed)   Type 2 diabetes mellitus (HCC)   Reports generally well-controlled with current regimen. No  recent hypoglycemic episodes since reducing Toujeo  to 12 units daily. Previous lows occurred overnight when on higher doses of insulin . Unclear if higher glucose and lower protein meals options were contributory.. Current regimen includes Toujeo  and glipizide . Issues with Ozempic  supply due to program discontinuation. - Continue Toujeo  at 12 units daily - Continue glipizide  as prescribed - Address Ozempic  supply issues with pharmacist and explore alternative programs      Relevant Medications   insulin  glargine, 2 Unit Dial, (TOUJEO  MAX SOLOSTAR) 300 UNIT/ML Solostar Pen   glipiZIDE  (GLUCOTROL ) 5 MG tablet   rosuvastatin  (CRESTOR ) 20 MG tablet   Other Relevant Orders   Hemoglobin A1c (Completed)   CBC with Differential/Platelet (Completed)   Comprehensive metabolic panel with GFR (Completed)   Hyperlipidemia associated with type 2 diabetes mellitus (HCC)   Associated with uncontrolled DM. Ongoing management with rosuvastatin . No symptoms at this time.       Relevant Medications   insulin  glargine, 2 Unit Dial, (TOUJEO  MAX SOLOSTAR) 300 UNIT/ML Solostar Pen   glipiZIDE  (GLUCOTROL ) 5 MG tablet   metoprolol  succinate (TOPROL -XL) 25 MG 24 hr tablet   rosuvastatin  (CRESTOR ) 20 MG tablet   Other Relevant Orders   Hemoglobin A1c (Completed)   CBC with Differential/Platelet (Completed)   Comprehensive metabolic panel with GFR (Completed)   Cystitis   Persistent urinary symptoms with frequent urination and leukocytes in urine suggest ongoing infection. Previous treatment with Bactrim  was partially effective. Differential includes kidney infection or other abdominal pathology. - Prescribed a different antibiotic to treat the UTI - Obtained urine sample for further analysis      Relevant Medications   nitrofurantoin , macrocrystal-monohydrate, (MACROBID ) 100 MG capsule   Upper abdominal pain   Persistent upper abdominal pain and headache for about a month, possibly related to previous hernia  repair or other abdominal pathology. Differential includes kidney infection, gallbladder issues, or reflux. No significant changes on previous CT scan. No fever or chills reported. - Ordered labs to check pancreatic enzymes and liver function - Will consider imaging if symptoms do not improve with antibiotic treatment      Relevant Orders   CBC with Differential/Platelet (Completed)   Comprehensive metabolic panel with GFR (Completed)   Amylase (Completed)   Lipase (Completed)   BMI 32.0-32.9,adult - Primary   Elevated LFTs   Relevant Orders   Hemoglobin A1c (Completed)   CBC with Differential/Platelet (Completed)   Comprehensive metabolic panel with GFR (Completed)   Other Visit Diagnoses       Need for influenza vaccination       Relevant Orders   Flu vaccine HIGH DOSE PF(Fluzone Trivalent) (Completed)     Need for COVID-19 vaccine       Relevant Orders   Pfizer Comirnaty Covid-19 Vaccine 31yrs & older (Completed)     Dysuria       Relevant Orders   POCT URINALYSIS DIP (CLINITEK)     Daily headache  Relevant Medications   metoprolol  succinate (TOPROL -XL) 25 MG 24 hr tablet          Return in about 3 months (around 10/14/2024) for Med Management 30.  SaraBeth Chisom Muntean, DNP, AGNP-c Time: 45 minutes, >50% spent counseling, care coordination, chart review, and documentation.  SABRAtime

## 2024-07-14 NOTE — Patient Instructions (Addendum)
 Keep watching your blood sugars closely. Our goal is to have your fasting blood sugars 100-130 in the morning without lows at night.  If you get lows at night, please pay close attention to what you ate the night before. If your meal was particularly small or high in carbohydrates, try to increase your protein to see if this helps.  You can get lows at night that rebound to highs if you don't eat enough before bed or if you eat too many carbs.   Start the nitrofurantoin  twice a day for 7 days to help with the UTI.   If your liver and pancreas labs are normal and if you don't feel the abdominal pain gets better with the new medication, we will plan to order imaging of the abdomen to make sure there is nothing else going on causing the symptoms.

## 2024-07-15 ENCOUNTER — Ambulatory Visit: Payer: Self-pay | Admitting: Nurse Practitioner

## 2024-07-15 LAB — HEMOGLOBIN A1C
Est. average glucose Bld gHb Est-mCnc: 197 mg/dL
Hgb A1c MFr Bld: 8.5 % — ABNORMAL HIGH (ref 4.8–5.6)

## 2024-07-15 LAB — CBC WITH DIFFERENTIAL/PLATELET
Basophils Absolute: 0 x10E3/uL (ref 0.0–0.2)
Basos: 0 %
EOS (ABSOLUTE): 0.1 x10E3/uL (ref 0.0–0.4)
Eos: 2 %
Hematocrit: 49.6 % — ABNORMAL HIGH (ref 34.0–46.6)
Hemoglobin: 16.4 g/dL — ABNORMAL HIGH (ref 11.1–15.9)
Immature Grans (Abs): 0 x10E3/uL (ref 0.0–0.1)
Immature Granulocytes: 0 %
Lymphocytes Absolute: 2.3 x10E3/uL (ref 0.7–3.1)
Lymphs: 30 %
MCH: 30.3 pg (ref 26.6–33.0)
MCHC: 33.1 g/dL (ref 31.5–35.7)
MCV: 92 fL (ref 79–97)
Monocytes Absolute: 0.5 x10E3/uL (ref 0.1–0.9)
Monocytes: 7 %
Neutrophils Absolute: 4.6 x10E3/uL (ref 1.4–7.0)
Neutrophils: 61 %
Platelets: 340 x10E3/uL (ref 150–450)
RBC: 5.41 x10E6/uL — ABNORMAL HIGH (ref 3.77–5.28)
RDW: 13.1 % (ref 11.7–15.4)
WBC: 7.6 x10E3/uL (ref 3.4–10.8)

## 2024-07-15 LAB — COMPREHENSIVE METABOLIC PANEL WITH GFR
ALT: 22 IU/L (ref 0–32)
AST: 20 IU/L (ref 0–40)
Albumin: 4.4 g/dL (ref 3.8–4.8)
Alkaline Phosphatase: 101 IU/L (ref 49–135)
BUN/Creatinine Ratio: 21 (ref 12–28)
BUN: 17 mg/dL (ref 8–27)
Bilirubin Total: 0.8 mg/dL (ref 0.0–1.2)
CO2: 26 mmol/L (ref 20–29)
Calcium: 9.3 mg/dL (ref 8.7–10.3)
Chloride: 102 mmol/L (ref 96–106)
Creatinine, Ser: 0.81 mg/dL (ref 0.57–1.00)
Globulin, Total: 1.8 g/dL (ref 1.5–4.5)
Glucose: 139 mg/dL — AB (ref 70–99)
Potassium: 4.9 mmol/L (ref 3.5–5.2)
Sodium: 141 mmol/L (ref 134–144)
Total Protein: 6.2 g/dL (ref 6.0–8.5)
eGFR: 78 mL/min/1.73 (ref >59)

## 2024-07-15 LAB — AMYLASE: Amylase: 74 U/L (ref 31–110)

## 2024-07-15 LAB — LIPASE: Lipase: 36 U/L (ref 14–85)

## 2024-07-19 DIAGNOSIS — R1010 Upper abdominal pain, unspecified: Secondary | ICD-10-CM | POA: Insufficient documentation

## 2024-07-19 LAB — POCT URINALYSIS DIP (CLINITEK)
Bilirubin, UA: NEGATIVE
Blood, UA: NEGATIVE
Glucose, UA: 500 mg/dL — AB
Ketones, POC UA: NEGATIVE mg/dL
Nitrite, UA: NEGATIVE
POC PROTEIN,UA: NEGATIVE
Spec Grav, UA: 1.02 (ref 1.010–1.025)
Urobilinogen, UA: 0.2 U/dL
pH, UA: 6 (ref 5.0–8.0)

## 2024-07-19 NOTE — Assessment & Plan Note (Signed)
 Associated with uncontrolled DM. Blood pressue controlled today. No regular home monitoring reported. Encouraged home blood pressure monitoring to get an idea of where blood pressure is staying in the home environment.  Consider medication adjustment based on reported home readings.

## 2024-07-19 NOTE — Assessment & Plan Note (Signed)
 Persistent urinary symptoms with frequent urination and leukocytes in urine suggest ongoing infection. Previous treatment with Bactrim  was partially effective. Differential includes kidney infection or other abdominal pathology. - Prescribed a different antibiotic to treat the UTI - Obtained urine sample for further analysis

## 2024-07-19 NOTE — Assessment & Plan Note (Signed)
 Reports generally well-controlled with current regimen. No recent hypoglycemic episodes since reducing Toujeo  to 12 units daily. Previous lows occurred overnight when on higher doses of insulin . Unclear if higher glucose and lower protein meals options were contributory.. Current regimen includes Toujeo  and glipizide . Issues with Ozempic  supply due to program discontinuation. - Continue Toujeo  at 12 units daily - Continue glipizide  as prescribed - Address Ozempic  supply issues with pharmacist and explore alternative programs

## 2024-07-19 NOTE — Assessment & Plan Note (Signed)
 Associated with uncontrolled DM. Ongoing management with rosuvastatin . No symptoms at this time.

## 2024-07-19 NOTE — Assessment & Plan Note (Signed)
 Persistent upper abdominal pain and headache for about a month, possibly related to previous hernia repair or other abdominal pathology. Differential includes kidney infection, gallbladder issues, or reflux. No significant changes on previous CT scan. No fever or chills reported. - Ordered labs to check pancreatic enzymes and liver function - Will consider imaging if symptoms do not improve with antibiotic treatment

## 2024-07-21 ENCOUNTER — Encounter: Payer: Self-pay | Admitting: Nurse Practitioner

## 2024-07-24 ENCOUNTER — Other Ambulatory Visit: Payer: Self-pay | Admitting: Nurse Practitioner

## 2024-07-24 DIAGNOSIS — E1169 Type 2 diabetes mellitus with other specified complication: Secondary | ICD-10-CM

## 2024-08-04 ENCOUNTER — Encounter: Payer: Self-pay | Admitting: Family Medicine

## 2024-08-04 ENCOUNTER — Other Ambulatory Visit: Payer: Self-pay

## 2024-08-04 ENCOUNTER — Ambulatory Visit: Admitting: Family Medicine

## 2024-08-04 VITALS — BP 130/74 | HR 88 | Temp 98.7°F | Ht 66.0 in | Wt 184.0 lb

## 2024-08-04 DIAGNOSIS — R1010 Upper abdominal pain, unspecified: Secondary | ICD-10-CM

## 2024-08-04 DIAGNOSIS — R051 Acute cough: Secondary | ICD-10-CM | POA: Diagnosis not present

## 2024-08-04 DIAGNOSIS — J069 Acute upper respiratory infection, unspecified: Secondary | ICD-10-CM

## 2024-08-04 LAB — POC COVID19 BINAXNOW: SARS Coronavirus 2 Ag: NEGATIVE

## 2024-08-04 LAB — POCT INFLUENZA A/B
Influenza A, POC: NEGATIVE
Influenza B, POC: NEGATIVE

## 2024-08-04 MED ORDER — BENZONATATE 200 MG PO CAPS: 200.0000 mg | ORAL_CAPSULE | Freq: Three times a day (TID) | ORAL | 0 refills | Status: AC | PRN

## 2024-08-04 NOTE — Progress Notes (Signed)
 other

## 2024-08-04 NOTE — Patient Instructions (Signed)
 Stay well hydrated. Continue the Mucinex DM twice daily. Use the albuterol  as needed. Take the benzonatate  if needed for cough.  Follow up if you develop fever, discolored mucus or phlegm, worsening shortness of breath, pain with breathing, or other new concerns.

## 2024-08-04 NOTE — Progress Notes (Signed)
 Chief Complaint  Patient presents with   other    Caught a cold in [place]  last week on Monday, scratchy throat now has cough and chest congestion- coughing up mucus   She was in [place]  last week, and was around her 71 yo granddaughter who had a runny nose. She started with a sore throat 12/8, and then developed PND, chest congestion and cough. She is starting to bring up phlegm, which is thick and creamy-colored.  Denies headaches, sinus pain. She was out of breath with stairs last week. Notices some wheezing at night the last few days. She has albuterol  at home, which she uses for wheezing, only when sick. No h/o asthma. Needed it Monday through Wed of last week, not since.  She took Coricidin last week.  Friday she started taking Mucinex DM 12 hour, and this has helped her get up the phlegm.  Sugars are per usual, no recent changes. Occasionally sees a high reading, if ate the wrong thing.      PMH, PSH, SH reviewed  DM, HTN, HLD  Lab Results  Component Value Date   HGBA1C 8.5 (H) 07/14/2024   Outpatient Encounter Medications as of 08/04/2024  Medication Sig Note   albuterol  (VENTOLIN  HFA) 108 (90 Base) MCG/ACT inhaler Inhale 1-2 puffs into the lungs every 6 (six) hours as needed for wheezing or shortness of breath.    aspirin  81 MG chewable tablet Chew 1 tablet (81 mg total) by mouth daily.    blood glucose meter kit and supplies Testing at least once per day (morning fasting), with option to test up to 4 times per day (morning fasting and as needed for symptoms) as needed for better control. Use only the strips that come in this kit with this meter- other strips will not work properly.    Blood Glucose Monitoring Suppl (ACCU-CHEK GUIDE ME) w/Device KIT 1 EACH BY DOES NOT APPLY ROUTE IN THE MORNING, AT NOON, AND AT BEDTIME. MAY SUBSTITUTE TO ANY MANUFACTURER COVERED BY PATIENT'S INSURANCE.    calcium  carbonate (OSCAL) 1500 (600 Ca) MG TABS tablet Take 0.4 tablets (600 mg total) by  mouth daily.    fluticasone  (FLONASE ) 50 MCG/ACT nasal spray SPRAY 2 SPRAYS INTO EACH NOSTRIL EVERY DAY (Patient taking differently: Place 2 sprays into both nostrils as needed for allergies.) 08/04/2024: As needed, Last dose 2 months ago   glipiZIDE  (GLUCOTROL ) 5 MG tablet Take 2 tablets (10 mg total) by mouth at bedtime.    insulin  glargine, 2 Unit Dial, (TOUJEO  MAX SOLOSTAR) 300 UNIT/ML Solostar Pen Inject 20 Units into the skin at bedtime. 12 units as of 07/14/24. Increase by 2 units every 4 days if AM fasting blood sugar is higher than 120. Goal blood sugar 100-120.    Insulin  Pen Needle (COMFORT EZ MICRO PEN NEEDLES) 32G X 4 MM MISC 1 Needle by Does not apply route daily.    lisinopril  (ZESTRIL ) 20 MG tablet Take 1 tablet (20 mg total) by mouth daily.    loratadine  (CLARITIN ) 10 MG tablet Take 1 tablet (10 mg total) by mouth daily.    meloxicam  (MOBIC ) 7.5 MG tablet TAKE 1 TABLET BY MOUTH EVERY DAY    metFORMIN  (GLUCOPHAGE ) 500 MG tablet Take 1 tablet (500 mg total) by mouth 2 (two) times daily with a meal.    metoprolol  succinate (TOPROL -XL) 25 MG 24 hr tablet Take 1 tablet (25 mg total) by mouth daily. For headache prevention    Multiple Vitamin (MULTIVITAMIN WITH MINERALS) TABS tablet  Take 1 tablet by mouth daily. Women's 50+ Multivitamin    Omega-3 Fatty Acids (FISH OIL  PO) Take 2,000 mg by mouth daily.    pantoprazole  (PROTONIX ) 40 MG tablet Take 1 tablet (40 mg total) by mouth 2 (two) times daily.    PARoxetine  (PAXIL ) 30 MG tablet Take 1 tablet (30 mg total) by mouth daily.    rizatriptan  (MAXALT ) 10 MG tablet Take 1 tablet (10 mg total) by mouth as needed for migraine. May repeat in 2 hours if needed. Do not take more than 2 doses in 24 hours. 09/19/2023: As needed   rosuvastatin  (CRESTOR ) 20 MG tablet Take 1 tablet (20 mg total) by mouth daily.    Semaglutide ,0.25 or 0.5MG /DOS, (OZEMPIC , 0.25 OR 0.5 MG/DOSE,) 2 MG/1.5ML SOPN On 2mg  dose with patient assistance. Sample provided for 2mg   dose x 2 months 04/08/2024 as she ran out.    Vitamin D , Ergocalciferol , (DRISDOL ) 1.25 MG (50000 UNIT) CAPS capsule TAKE 1 CAPSULE BY MOUTH EVERY 7 DAYS    [DISCONTINUED] nitrofurantoin , macrocrystal-monohydrate, (MACROBID ) 100 MG capsule Take 1 capsule (100 mg total) by mouth 2 (two) times daily. Take 1 cap by mouth every 12 hours for 5 days for UTI. (Patient not taking: Reported on 08/04/2024)    No facility-administered encounter medications on file as of 08/04/2024.   Allergies[1]  Cipro   ROS: No f/c, chest pain. SOB improved. URI symptoms per HPI. No n/v/d No bleeding, bruises, rashes   PHYSICAL EXAM:  BP 130/74   Pulse 88   Temp 98.7 F (37.1 C) (Tympanic)   Ht 5' 6 (1.676 m)   Wt 184 lb (83.5 kg)   SpO2 95%   BMI 29.70 kg/m   Pleasant female, in no distress. HEENT: conjunctiva and sclera are clear, EOMI. TM's and EAC's normal. Nasal mucosa is without erythema or purulence. Sinuses nontender. OP is clear Neck: no lymphadenopathy or mass Heart: regular rate and rhythm Lungs: clear bilaterally, with fair air movement. Cough with forced expiration, otherwise normal Skin: normal turgor, no rash Psych: normal mood, affect, hygiene and grooming Neuro: alert and oriented, cranial nerves grossly intact  Influenza A&B negative COVID antigen negative   ASSESSMENT/PLAN:  Viral upper respiratory illness - supportive measures reviewed.  s/sx bacterial infection reviewed. Cont Mucinex DM, albuterol  prn. tessalon  prn. f/u if worse  Acute cough - Plan: POC COVID-19 BinaxNow, Influenza A/B, benzonatate  (TESSALON ) 200 MG capsule  Stay well hydrated. Continue the Mucinex DM twice daily. Use the albuterol  as needed. Take the benzonatate  if needed for cough.  Follow up if you develop fever, discolored mucus or phlegm, worsening shortness of breath, pain with breathing, or other new concerns.     [1]  Allergies Allergen Reactions   Ciprofloxacin  Other (See Comments)     Stomach cramps    Penicillins Palpitations    Did it involve swelling of the face/tongue/throat, SOB, or low BP? No Did it involve sudden or severe rash/hives, skin peeling, or any reaction on the inside of your mouth or nose? No Did you need to seek medical attention at a hospital or doctor's office? No When did it last happen? [phone]     If all above answers are NO, may proceed with cephalosporin use.

## 2024-08-11 ENCOUNTER — Other Ambulatory Visit: Payer: Self-pay | Admitting: Medical

## 2024-08-11 DIAGNOSIS — Z794 Long term (current) use of insulin: Secondary | ICD-10-CM

## 2024-08-15 ENCOUNTER — Telehealth: Payer: Self-pay

## 2024-08-15 ENCOUNTER — Encounter: Payer: Self-pay | Admitting: Nurse Practitioner

## 2024-08-15 ENCOUNTER — Other Ambulatory Visit: Payer: Self-pay

## 2024-08-15 MED ORDER — OSELTAMIVIR PHOSPHATE 75 MG PO CAPS: 75.0000 mg | ORAL_CAPSULE | Freq: Every day | ORAL | 0 refills | Status: DC

## 2024-08-15 NOTE — Telephone Encounter (Signed)
 Copied from CRM #[id number]. Topic: Clinical - Medical Advice >> Aug 15, 2024  1:53 PM Berwyn MATSU wrote: Reason for CRM: Patient daughter in law called in to advise that patients grandchildren have Flu type B as of today 08/15/24 and patient is not having no symptoms at this time but due to her being diabetic they just want to advise MD and what they should do.   May you please advise.

## 2024-08-18 ENCOUNTER — Ambulatory Visit
Admission: RE | Admit: 2024-08-18 | Discharge: 2024-08-18 | Disposition: A | Source: Ambulatory Visit | Attending: Nurse Practitioner

## 2024-08-18 DIAGNOSIS — R1010 Upper abdominal pain, unspecified: Secondary | ICD-10-CM

## 2024-08-21 ENCOUNTER — Encounter: Payer: Self-pay | Admitting: Nurse Practitioner

## 2024-08-22 ENCOUNTER — Emergency Department (HOSPITAL_BASED_OUTPATIENT_CLINIC_OR_DEPARTMENT_OTHER)
Admission: EM | Admit: 2024-08-22 | Discharge: 2024-08-22 | Disposition: A | Discharge status: Home / Self Care | Attending: Emergency Medicine | Admitting: Emergency Medicine

## 2024-08-22 ENCOUNTER — Other Ambulatory Visit

## 2024-08-22 ENCOUNTER — Other Ambulatory Visit: Payer: Self-pay

## 2024-08-22 ENCOUNTER — Encounter (HOSPITAL_BASED_OUTPATIENT_CLINIC_OR_DEPARTMENT_OTHER): Payer: Self-pay

## 2024-08-22 DIAGNOSIS — Z79899 Other long term (current) drug therapy: Secondary | ICD-10-CM | POA: Diagnosis not present

## 2024-08-22 DIAGNOSIS — L723 Sebaceous cyst: Secondary | ICD-10-CM | POA: Diagnosis not present

## 2024-08-22 DIAGNOSIS — R221 Localized swelling, mass and lump, neck: Secondary | ICD-10-CM | POA: Diagnosis present

## 2024-08-22 DIAGNOSIS — Z7984 Long term (current) use of oral hypoglycemic drugs: Secondary | ICD-10-CM | POA: Diagnosis not present

## 2024-08-22 DIAGNOSIS — E119 Type 2 diabetes mellitus without complications: Secondary | ICD-10-CM | POA: Diagnosis not present

## 2024-08-22 DIAGNOSIS — Z7982 Long term (current) use of aspirin: Secondary | ICD-10-CM | POA: Diagnosis not present

## 2024-08-22 DIAGNOSIS — Z794 Long term (current) use of insulin: Secondary | ICD-10-CM | POA: Insufficient documentation

## 2024-08-22 MED ORDER — DOXYCYCLINE HYCLATE 100 MG PO CAPS: 100.0000 mg | ORAL_CAPSULE | Freq: Two times a day (BID) | ORAL | 0 refills | Status: AC

## 2024-08-22 MED ORDER — OXYCODONE HCL 5 MG PO TABS: 5.0000 mg | ORAL_TABLET | ORAL | 0 refills | Status: AC | PRN

## 2024-08-22 MED ORDER — LIDOCAINE HCL (PF) 1 % IJ SOLN
5.0000 mL | Freq: Once | INTRAMUSCULAR | Status: AC
  Administered 2024-08-22: 5 mL
  Filled 2024-08-22: qty 5

## 2024-08-22 NOTE — ED Notes (Signed)
 Discharge instructions, follow up care, and prescriptions reviewed and explained, pt verbalized understanding.

## 2024-08-22 NOTE — ED Triage Notes (Signed)
 Pt complaining of a bump on the back of her neck for a week. Went to the hairdresser yesterday and they nicked it with the clippers now it is hurting more.

## 2024-08-22 NOTE — Discharge Instructions (Addendum)
 We were unable to drain anything with the aspiration.  This is likely a cyst.  Will start you on antibiotics.  Follow-up with your primary care doctor early next week.  Do warm compresses 5 times a day for 10 minutes each time.  For any worsening symptoms please return to the emergency room. Take Tylenol  1000 mg every 6 hours.  Have sent a short course of pain medicine into the pharmacy.  Only use this for severe breakthrough pain.

## 2024-08-22 NOTE — ED Provider Notes (Addendum)
 " [place] [facility] AT [facility] Provider Note   CSN: [id number] Arrival date & time: 08/22/24  9049     Patient presents with: Abscess   Kendra Miles is a 74 y.o. female.   74 year old female presents today for concern of lump back of her neck that has been worsening for the past week.  2 weeks ago she went to a hairdresser.  She states she had some swelling before then but it was very minimal and not noticeable.  However over the past week the swelling has gotten worse and she has noticed some redness.  No fever, no difficulty moving her neck, no drainage.  She does have a PCP.  She has not tried anything for this prior to arrival.  The history is provided by the patient. No language interpreter was used.       Prior to Admission medications  Medication Sig Start Date End Date Taking? Authorizing Provider  albuterol  (VENTOLIN  HFA) 108 (90 Base) MCG/ACT inhaler Inhale 1-2 puffs into the lungs every 6 (six) hours as needed for wheezing or shortness of breath. 06/09/24   Early, Sara E, NP  aspirin  81 MG chewable tablet Chew 1 tablet (81 mg total) by mouth daily. 05/04/23   Early, Sara E, NP  benzonatate  (TESSALON ) 200 MG capsule Take 1 capsule (200 mg total) by mouth 3 (three) times daily as needed for cough. 08/04/24   Randol Dawes, MD  blood glucose meter kit and supplies Testing at least once per day (morning fasting), with option to test up to 4 times per day (morning fasting and as needed for symptoms) as needed for better control. Use only the strips that come in this kit with this meter- other strips will not work properly. 11/02/20   Early, Sara E, NP  Blood Glucose Monitoring Suppl (ACCU-CHEK GUIDE ME) w/Device KIT 1 EACH BY DOES NOT APPLY ROUTE IN THE MORNING, AT NOON, AND AT BEDTIME. MAY SUBSTITUTE TO ANY MANUFACTURER COVERED BY PATIENT'S INSURANCE. 06/01/23   Early, Camie BRAVO, NP  calcium  carbonate (OSCAL) 1500 (600 Ca) MG TABS tablet Take 0.4 tablets (600 mg  total) by mouth daily. 11/02/20   Early, Sara E, NP  fluticasone  (FLONASE ) 50 MCG/ACT nasal spray SPRAY 2 SPRAYS INTO EACH NOSTRIL EVERY DAY Patient taking differently: Place 2 sprays into both nostrils as needed for allergies. 12/15/21   Early, Sara E, NP  glipiZIDE  (GLUCOTROL ) 5 MG tablet Take 2 tablets (10 mg total) by mouth at bedtime. 07/14/24   Early, Sara E, NP  insulin  glargine, 2 Unit Dial, (TOUJEO  MAX SOLOSTAR) 300 UNIT/ML Solostar Pen Inject 20 Units into the skin at bedtime. 12 units as of 07/14/24. Increase by 2 units every 4 days if AM fasting blood sugar is higher than 120. Goal blood sugar 100-120. 07/14/24   Early, Camie BRAVO, NP  Insulin  Pen Needle (COMFORT EZ MICRO PEN NEEDLES) 32G X 4 MM MISC 1 Needle by Does not apply route daily. 02/25/24   Early, Sara E, NP  lisinopril  (ZESTRIL ) 20 MG tablet Take 1 tablet (20 mg total) by mouth daily. 12/05/23   Early, Sara E, NP  loratadine  (CLARITIN ) 10 MG tablet Take 1 tablet (10 mg total) by mouth daily. 02/04/21   Early, Sara E, NP  meloxicam  (MOBIC ) 7.5 MG tablet TAKE 1 TABLET BY MOUTH EVERY DAY 05/22/24   Early, Sara E, NP  metFORMIN  (GLUCOPHAGE ) 500 MG tablet TAKE 1 TABLET(500 MG) BY MOUTH TWICE DAILY WITH A MEAL 08/11/24  Early, Sara E, NP  metoprolol  succinate (TOPROL -XL) 25 MG 24 hr tablet Take 1 tablet (25 mg total) by mouth daily. For headache prevention 07/14/24   Early, Sara E, NP  Multiple Vitamin (MULTIVITAMIN WITH MINERALS) TABS tablet Take 1 tablet by mouth daily. Women's 50+ Multivitamin    [provider]  Omega-3 Fatty Acids (FISH OIL  PO) Take 2,000 mg by mouth daily.    [provider]  oseltamivir  (TAMIFLU ) 75 MG capsule Take 1 capsule (75 mg total) by mouth daily. 08/15/24   Early, Sara E, NP  pantoprazole  (PROTONIX ) 40 MG tablet Take 1 tablet (40 mg total) by mouth 2 (two) times daily. 04/08/24   Early, Sara E, NP  PARoxetine  (PAXIL ) 30 MG tablet Take 1 tablet (30 mg total) by mouth daily. 04/08/24   Early, Sara  E, NP  rizatriptan  (MAXALT ) 10 MG tablet Take 1 tablet (10 mg total) by mouth as needed for migraine. May repeat in 2 hours if needed. Do not take more than 2 doses in 24 hours. 07/20/21   Early, Sara E, NP  rosuvastatin  (CRESTOR ) 20 MG tablet Take 1 tablet (20 mg total) by mouth daily. 07/14/24   Early, Sara E, NP  Semaglutide ,0.25 or 0.5MG /DOS, (OZEMPIC , 0.25 OR 0.5 MG/DOSE,) 2 MG/1.5ML SOPN On 2mg  dose with patient assistance. Sample provided for 2mg  dose x 2 months 04/08/2024 as she ran out. 04/08/24   Early, Camie BRAVO, NP  Vitamin D , Ergocalciferol , (DRISDOL ) 1.25 MG (50000 UNIT) CAPS capsule TAKE 1 CAPSULE BY MOUTH EVERY 7 DAYS 06/10/24   Early, Sara E, NP    Allergies: Ciprofloxacin  and Penicillins    Review of Systems  Constitutional:  Negative for fever.  Musculoskeletal:  Negative for neck pain and neck stiffness.  All other systems reviewed and are negative.   Updated Vital Signs BP (!) 154/89 (BP Location: Right Arm)   Pulse 96   Temp 97.6 F (36.4 C) (Oral)   Resp 18   Ht 5' 6 (1.676 m)   Wt 83 kg   SpO2 99%   BMI 29.54 kg/m   Physical Exam Vitals and nursing note reviewed.  Constitutional:      General: She is not in acute distress.    Appearance: Normal appearance. She is not ill-appearing.  HENT:     Head: Normocephalic and atraumatic.     Nose: Nose normal.  Eyes:     Conjunctiva/sclera: Conjunctivae normal.  Cardiovascular:     Rate and Rhythm: Normal rate.  Pulmonary:     Effort: Pulmonary effort is normal. No respiratory distress.  Musculoskeletal:        General: No deformity. Normal range of motion.     Cervical back: Normal range of motion.  Skin:    Findings: No rash.     Comments: Indurated swelling in the posterior neck.  No erythema, warmth or fluctuance.  Not pulsatile.  Neurological:     Mental Status: She is alert.     (all labs ordered are listed, but only abnormal results are displayed) Labs Reviewed - No data to  display  EKG: None  Radiology: No results found.   Procedures   Medications Ordered in the ED  lidocaine  (PF) (XYLOCAINE ) 1 % injection 5 mL (has no administration in time range)                                    Medical Decision  Making Risk Prescription drug management.   Medical Decision Making / ED Course   This patient presents to the ED for concern of swelling to the back of the neck, this involves an extensive number of treatment options, and is a complaint that carries with it a high risk of complications and morbidity.  The differential diagnosis includes cyst, abscess  MDM: 74 year old female presents today for concern of swelling to the back of her neck.  It is indurated, not significantly erythematous.  I evaluated this with bedside ultrasound and it did show a fluid pocket but there is no fluctuance and does have a significant roof.  Shared decision making had with patient regarding antibiotics, warm compresses and follow-up with PCP versus attempt at aspiration or incision and drainage.  Patient after discussion would like to at least attempt aspiration to see if we could drain some of this out.  If this is not successful she would like to do antibiotics and warm compress and follow-up with her PCP.   Aspiration with 18-gauge attempted without any aspiration.  Likely a cyst.  Will have her follow-up with PCP.  Will still go ahead and start on antibiotics.  No open wound.  Last tetanus shot was September/20 02/2016.  Patient discharged in stable condition.  Strict return precautions discussed.  Discussed with attending.  Lab Tests: -I ordered, reviewed, and interpreted labs.   The pertinent results include:   Labs Reviewed - No data to display    EKG  EKG Interpretation Date/Time:    Ventricular Rate:    PR Interval:    QRS Duration:    QT Interval:    QTC Calculation:   R Axis:      Text Interpretation:           Imaging Studies ordered: I  ordered imaging studies including bedside ultrasound I independently visualized and interpreted imaging. I agree with the radiologist interpretation   Medicines ordered and prescription drug management: Meds ordered this encounter  Medications   lidocaine  (PF) (XYLOCAINE ) 1 % injection 5 mL    -I have reviewed the patients home medicines and have made adjustments as needed  Social Determinants of Health:  Factors impacting patients care include: Good outpatient follow-up   Reevaluation: After the interventions noted above, I reevaluated the patient and found that they have :stayed the same  Co morbidities that complicate the patient evaluation  Past Medical History:  Diagnosis Date   Acid reflux    Arthritis    Back pain 11/02/2020   Calcium  deficiency 11/02/2020   Choledocholithiasis with obstruction 03/06/2019   Cholelithiasis with choledocholithiasis 02/2019   Contusion of face 02/17/2022   Diabetes (HCC)    Emesis, persistent 06/10/2021   Encounter to establish care with new doctor 11/02/2020   Fall 05/06/2021   Flatulence, eructation and gas pain 11/03/2020   Gastric dilation 06/10/2021   Headache    Hiatal hernia    Hyperlipidemia    Lower extremity edema 11/02/2020   Menopausal symptoms 11/02/2020   Otitis media 08/30/2021   Pain of right hip 05/11/2023   Pneumonia    Polyp of stomach and duodenum    Pyelonephritis of right kidney 04/10/2023   Upper GI bleed 02/03/2021   Viral URI 08/23/2021      Dispostion: Discharged in stable condition.  Return precautions discussed.  Patient voices understanding and is in agreement with plan.  Final diagnoses:  Sebaceous cyst    ED Discharge Orders  Ordered    doxycycline  (VIBRAMYCIN ) 100 MG capsule  2 times daily        08/22/24 1131               Hildegard Loge, PA-C 08/22/24 1132    Hildegard Loge, PA-C 08/22/24 1133    Levander Houston, MD 09/04/24 1434  "

## 2024-08-25 ENCOUNTER — Ambulatory Visit: Payer: Self-pay | Admitting: Nurse Practitioner

## 2024-08-26 ENCOUNTER — Encounter: Payer: Self-pay | Admitting: Nurse Practitioner

## 2024-08-26 ENCOUNTER — Ambulatory Visit: Admitting: Family Medicine

## 2024-08-26 VITALS — BP 122/94 | HR 92 | Wt 180.8 lb

## 2024-08-26 DIAGNOSIS — I152 Hypertension secondary to endocrine disorders: Secondary | ICD-10-CM | POA: Diagnosis not present

## 2024-08-26 DIAGNOSIS — L723 Sebaceous cyst: Secondary | ICD-10-CM | POA: Diagnosis not present

## 2024-08-26 DIAGNOSIS — E1159 Type 2 diabetes mellitus with other circulatory complications: Secondary | ICD-10-CM | POA: Diagnosis not present

## 2024-08-26 DIAGNOSIS — K449 Diaphragmatic hernia without obstruction or gangrene: Secondary | ICD-10-CM

## 2024-08-26 MED ORDER — LISINOPRIL 30 MG PO TABS: 30.0000 mg | ORAL_TABLET | Freq: Every day | ORAL | 1 refills | Status: AC

## 2024-08-26 NOTE — Progress Notes (Signed)
" ° °  Name: Kendra Miles   Date of Visit: 08/26/2024   Date of last visit with me: Visit date not found   CHIEF COMPLAINT:  Chief Complaint  Patient presents with   Hospitalization Follow-up    ER follow up on lump on back of neck, big and painful.        HPI:  Discussed the use of AI scribe software for clinical note transcription with the patient, who gave verbal consent to proceed.  History of Present Illness   Kendra Miles is a 74 year old female who presents with neck pain and stomach issues.  She has been experiencing neck pain due to a tender sebaceous cyst that became painful about two weeks ago after being nicked with a razor during a haircut. She visited the ER last Friday due to the tenderness and is currently on antibiotics. She manages her pain with Tylenol , which provides some relief and helps her sleep.  She has been diagnosed with a hiatal hernia, which she associates with her stomach issues. She has been on Ozempic  and has lost about ten pounds, going from 195 to 180 pounds. She also mentions having diverticulosis, but there are no signs of infection.  Her blood pressure has been elevated, noted during her ER visit and again today. She attributes this to pain, as she was unable to milk yesterday due to discomfort. She is currently taking lisinopril  20 mg for blood pressure management.         OBJECTIVE:       04/08/2024    8:16 AM  Depression screen PHQ 2/9  Decreased Interest 0  Down, Depressed, Hopeless 0  PHQ - 2 Score 0     BP Readings from Last 3 Encounters:  08/26/24 (!) 122/94  08/22/24 (!) 142/98  08/04/24 130/74    BP (!) 122/94   Pulse 92   Wt 180 lb 12.8 oz (82 kg)   SpO2 94%   BMI 29.18 kg/m    Physical Exam   MEASUREMENTS: Weight- 180. NECK: Tender cyst on neck.      Physical Exam Constitutional:      Appearance: Normal appearance.  Skin:    Comments: Cyst of the neck posterior neck noted. Mild erythema, mildly TTP. No  fluntance, firm non mobile mass.   Neurological:     General: No focal deficit present.     Mental Status: She is alert and oriented to person, place, and time. Mental status is at baseline.     ASSESSMENT/PLAN:   Assessment & Plan Sebaceous cyst  Hiatal hernia  Hypertension associated with diabetes (HCC)    Assessment and Plan    Sebaceous cyst of neck Tender sebaceous cyst on neck, hard, no abscess. Surgical removal recommended. - Referred to general surgeon for surgical removal.  Hiatal hernia Hiatal hernia identified on CT. Possible surgical intervention if symptomatic. - Referred to general surgeon for evaluation and potential repair.  Hypertension Elevated blood pressure, likely due to pain. Current medication lisinopril  20 mg. Considered dose increase for better control, especially pre-surgery. - Increased lisinopril  to 30 mg daily. - Sent prescription to [facility] on [address].         Shaina Gullatt A. Vita MD [facility] "

## 2024-08-28 ENCOUNTER — Encounter: Payer: Self-pay | Admitting: Family Medicine

## 2024-08-28 ENCOUNTER — Ambulatory Visit: Payer: Self-pay

## 2024-08-28 ENCOUNTER — Ambulatory Visit (INDEPENDENT_AMBULATORY_CARE_PROVIDER_SITE_OTHER): Admitting: Family Medicine

## 2024-08-28 VITALS — BP 114/72 | HR 86 | Wt 182.4 lb

## 2024-08-28 DIAGNOSIS — L739 Follicular disorder, unspecified: Secondary | ICD-10-CM | POA: Diagnosis not present

## 2024-08-28 DIAGNOSIS — L723 Sebaceous cyst: Secondary | ICD-10-CM | POA: Diagnosis not present

## 2024-08-28 MED ORDER — GENTAMICIN SULFATE 0.1 % EX OINT: 1.0000 | TOPICAL_OINTMENT | Freq: Two times a day (BID) | CUTANEOUS | 0 refills | Status: AC

## 2024-08-28 NOTE — Telephone Encounter (Signed)
 Scheduled pt for this pm

## 2024-08-28 NOTE — Progress Notes (Signed)
" ° °  Name: Kendra Miles   Date of Visit: 08/28/2024   Date of last visit with me: 08/26/2024   CHIEF COMPLAINT:  Chief Complaint  Patient presents with   Acute Visit    Bump on neck, cyst, has been small for years. Got snipped with a pair of scissors getting hair done. Now it is inflamed and has white spots on it.          HPI:  Discussed the use of AI scribe software for clinical note transcription with the patient, who gave verbal consent to proceed.  History of Present Illness   Kendra Miles is a 73 year old female who presents with white spots and itching on a cyst.  She has developed white spots on a lump, which she describes as a cyst. The spots were noticed by her daughter-in-law yesterday. There is associated itching in the area of the cyst.  She is currently on oral antibiotics.  She has a consultation scheduled for the cyst, but no surgery is planned yet. She is concerned about the timing of the surgery as she plans to travel on the 17th to be with her oldest son, who is undergoing prostate surgery.         OBJECTIVE:       04/08/2024    8:16 AM  Depression screen PHQ 2/9  Decreased Interest 0  Down, Depressed, Hopeless 0  PHQ - 2 Score 0     BP Readings from Last 3 Encounters:  08/28/24 114/72  08/26/24 (!) 122/94  08/22/24 (!) 142/98    BP 114/72   Pulse 86   Wt 182 lb 6.4 oz (82.7 kg)   SpO2 96%   BMI 29.44 kg/m    Physical Exam   SKIN: Skin over cyst with small infection. Noted folliculitis of the area, no signs of worsening erythema or infection. No abscess noted.      Physical Exam  ASSESSMENT/PLAN:   Assessment & Plan Sebaceous cyst  Folliculitis    Assessment and Plan    Sebaceous cyst with localized skin infection Minor infection expected to drain spontaneously. Topical antibiotics preferred due to localized infection and current oral antibiotic regimen. - Prescribed topical antibiotic twice daily for 4-5 days. - Advised warm  compresses to promote drainage. - Instructed to run area under hot water to aid drainage. - Advised against popping cyst until after a day of antibiotic treatment. - Continue current oral antibiotics. - Sent prescription to pharmacy and offered alternative if cost is prohibitive.         Kynsleigh Westendorf A. Vita MD [facility] "

## 2024-08-28 NOTE — Telephone Encounter (Signed)
 FYI Only or Action Required?: Action required by provider: clinical question for provider.Please call patient with further recommendations for itchy white bumps.   Patient was last seen in primary care on 08/26/2024 by Vita Morrow, MD.  Called Nurse Triage reporting Cyst.  Symptoms began yesterday.  Interventions attempted: Nothing.  Symptoms are: gradually worsening.  Triage Disposition: Home Care  Patient/caregiver understands and will follow disposition?: No, wishes to speak with PCP  Reason for Disposition  [1] Small swelling or lump AND [2] unexplained AND [3] present < 1 week  Answer Assessment - Initial Assessment Questions 1. APPEARANCE of RASH: What does the rash look like? What color is it? (Note: It is difficult to assess rash color in people with darker-colored skin. When this situation occurs, simply ask the caller to describe what they see.)     *No Answer* 2. SIZE: How big are the spots? (e.g., inches, cm; or compare to size of pinhead, tip of pen, eraser, pea)      *No Answer* 3. LOCATION: Where is the rash located?     *No Answer* 4. ONSET: When did the rash begin?     *No Answer* 5. FEVER: Do you have a fever? If Yes, ask: What is your temperature, how was it measured, and when did it start?     *No Answer* 6. CAUSE: What do you think is causing the rash?     *No Answer* 7. MEDICAL HISTORY: Do you have any medical problems that can cause easy bruising or bleeding? (e.g., leukemia, liver disease, recent chemotherapy)     *No Answer* 8. MEDICINES: Do you take any medicines which thin the blood such as: aspirin , heparin, ibuprofen (NSAIDS), Plavix, or Coumadin?     *No Answer* 9. OTHER SYMPTOMS: Do you have any other symptoms? (e.g., headache, dizziness, vomiting, sore throat, joint pain, bleeding)     *No Answer*  Answer Assessment - Initial Assessment Questions Pt had OV 08/27/23 for lump on back of neck. Pt states she now has white pimples on  area and itches like crazy, continuous pain 5/10 that started 08/27/24. Has 4-5 days worth of abx left. Pt is scheduled for surgery 09/02/24. Pt advised to keep surgery appt and writer will send a message to PCP for further recommendations regarding white bumps.  1. APPEARANCE of SWELLING: What does it look like?     White heads (pimples) on lump area 2. SIZE: How large is the swelling? (e.g., inches, cm; or compare to size of pinhead, tip of pen, eraser, coin, pea, grape, ping pong ball)      White heads the size of a pen tip 3. LOCATION: Where is the swelling located?     On lump that's on the back of neck 4. ONSET: When did the swelling start?     08/27/24 5. COLOR: What color is it? Is there more than one color?     White bumps  6. PAIN: Is there any pain? If Yes, ask: How bad is the pain? (Scale 1-10; or mild, moderate, severe)       5/10 7. ITCH: Does it itch? If Yes, ask: How bad is the itch?      Yes - very bad 8. CAUSE: What do you think caused the swelling?     Cyst  9 OTHER SYMPTOMS: Do you have any other symptoms? (e.g., fever)     Denies  Protocols used: Rash - Purple Spots or Dots-A-AH, Skin Lump or Localized Swelling-A-AH  Copied from CRM #  [id number]. Topic: Clinical - Red Word Triage >> Aug 28, 2024  1:22 PM Kendra Miles wrote: Red Word that prompted transfer to Nurse Triage: Pt was previosuly seen for Lump on back of neck and have now developed white spots that look like pimples that itch. Transfer to NT

## 2024-09-15 NOTE — Telephone Encounter (Signed)
 PAP OZEMPIC  ended, pt med D/C

## 2024-09-19 ENCOUNTER — Other Ambulatory Visit: Payer: Self-pay | Admitting: Nurse Practitioner

## 2024-09-19 DIAGNOSIS — E1169 Type 2 diabetes mellitus with other specified complication: Secondary | ICD-10-CM

## 2024-09-26 ENCOUNTER — Encounter: Payer: Self-pay | Admitting: Internal Medicine

## 2024-10-16 ENCOUNTER — Ambulatory Visit: Admitting: Nurse Practitioner

## 2024-10-31 ENCOUNTER — Ambulatory Visit: Admitting: Nurse Practitioner

## 2025-04-20 ENCOUNTER — Encounter: Payer: Self-pay | Admitting: Nurse Practitioner
